# Patient Record
Sex: Male | Born: 1960 | Race: White | Hispanic: No | Smoking: Current every day smoker
Health system: Southern US, Community
[De-identification: ages and names within clinical notes are randomized; demographics above are authoritative.]

## PROBLEM LIST (undated history)

## (undated) DIAGNOSIS — A419 Sepsis, unspecified organism: Secondary | ICD-10-CM

## (undated) DIAGNOSIS — M1990 Unspecified osteoarthritis, unspecified site: Secondary | ICD-10-CM

## (undated) DIAGNOSIS — I10 Essential (primary) hypertension: Secondary | ICD-10-CM

## (undated) DIAGNOSIS — J449 Chronic obstructive pulmonary disease, unspecified: Secondary | ICD-10-CM

## (undated) DIAGNOSIS — T7840XA Allergy, unspecified, initial encounter: Secondary | ICD-10-CM

## (undated) DIAGNOSIS — M1611 Unilateral primary osteoarthritis, right hip: Secondary | ICD-10-CM

## (undated) DIAGNOSIS — E119 Type 2 diabetes mellitus without complications: Secondary | ICD-10-CM

## (undated) HISTORY — PX: ARM WOUND REPAIR / CLOSURE: SUR1141

## (undated) HISTORY — PX: SKIN GRAFT: SHX250

## (undated) HISTORY — PX: ANKLE SURGERY: SHX546

## (undated) HISTORY — DX: Severe sepsis without septic shock: A41.9

## (undated) HISTORY — DX: Allergy, unspecified, initial encounter: T78.40XA

## (undated) HISTORY — PX: NECK SURGERY: SHX720

---

## 2005-01-16 ENCOUNTER — Emergency Department: Payer: Self-pay | Admitting: Emergency Medicine

## 2005-01-17 ENCOUNTER — Emergency Department: Payer: Self-pay | Admitting: Emergency Medicine

## 2007-04-08 ENCOUNTER — Emergency Department: Payer: Self-pay | Admitting: Emergency Medicine

## 2012-11-26 ENCOUNTER — Emergency Department (HOSPITAL_COMMUNITY): Payer: Self-pay

## 2012-11-26 ENCOUNTER — Emergency Department (HOSPITAL_COMMUNITY)
Admission: EM | Admit: 2012-11-26 | Discharge: 2012-11-26 | Disposition: A | Discharge status: Home / Self Care | Payer: Self-pay | Attending: Emergency Medicine | Admitting: Emergency Medicine

## 2012-11-26 ENCOUNTER — Encounter (HOSPITAL_COMMUNITY): Payer: Self-pay

## 2012-11-26 DIAGNOSIS — M25569 Pain in unspecified knee: Secondary | ICD-10-CM | POA: Insufficient documentation

## 2012-11-26 DIAGNOSIS — Z9889 Other specified postprocedural states: Secondary | ICD-10-CM | POA: Insufficient documentation

## 2012-11-26 DIAGNOSIS — F17200 Nicotine dependence, unspecified, uncomplicated: Secondary | ICD-10-CM | POA: Insufficient documentation

## 2012-11-26 DIAGNOSIS — M25562 Pain in left knee: Secondary | ICD-10-CM

## 2012-11-26 DIAGNOSIS — Z87828 Personal history of other (healed) physical injury and trauma: Secondary | ICD-10-CM | POA: Insufficient documentation

## 2012-11-26 MED ORDER — HYDROCODONE-ACETAMINOPHEN 5-325 MG PO TABS: 1.0000 | ORAL_TABLET | ORAL | Status: DC | PRN

## 2012-11-26 MED ORDER — IBUPROFEN 600 MG PO TABS: 600.0000 mg | ORAL_TABLET | Freq: Four times a day (QID) | ORAL | Status: DC | PRN

## 2012-11-26 NOTE — ED Provider Notes (Signed)
Medical screening examination/treatment/procedure(s) were performed by non-physician practitioner and as supervising physician I was immediately available for consultation/collaboration.  Donnetta Hutching, MD 11/26/12 [phone]

## 2012-11-26 NOTE — ED Notes (Signed)
Pt c/o pain and swelling to left knee and leg x 1 week.  Reports football injury years ago but has never had any problems since.  Denies recent injury.

## 2012-11-26 NOTE — ED Provider Notes (Signed)
CSN: [id number]     Arrival date & time 11/26/12  0944 History   First MD Initiated Contact with Patient 11/26/12 [phone]     Chief Complaint  Patient presents with  . Knee Pain   (Consider location/radiation/quality/duration/timing/severity/associated sxs/prior Treatment) HPI Comments: Jimmy Bishop is a 52 y.o. Male presenting with left knee pain which has been worsened for the past week.  He denies any new injury specifically but does have a history of prior ankle fracture and proximal fibula fracture with surgical intervention.  He describes having screws in his patella which were removed about 5 years ago as they started backing out.  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 His pain is constant, now worse with attempts to flex the knee.  He denies fevers or chills, no recent injury.     The history is provided by the patient.    History reviewed. No pertinent past medical history. Past Surgical History  Procedure Laterality Date  . Ankle surgery     No family history on file. History  Substance Use Topics  . Smoking status: Current Every Day Smoker  . Smokeless tobacco: Not on file  . Alcohol Use: Yes     Comment: occ    Review of Systems  Constitutional: Negative for fever.  Musculoskeletal: Positive for arthralgias. Negative for myalgias and joint swelling.  Neurological: Negative for weakness and numbness.    Allergies  Review of patient's allergies indicates no known allergies.  Home Medications   Current Outpatient Rx  Name  Route  Sig  Dispense  Refill  . HYDROcodone-acetaminophen (NORCO/VICODIN) 5-325 MG per tablet   Oral   Take 1 tablet by mouth every 4 (four) hours as needed for pain.   15 tablet   0   . ibuprofen (ADVIL,MOTRIN) 600 MG tablet   Oral   Take 1 tablet (600 mg total) by mouth every 6 (six) hours as needed for pain.   30 tablet   0    BP 176/99  Pulse 87   Temp(Src) 98.6 F (37 C) (Oral)  Resp 20  Ht 5\' 9"  (1.753 m)  Wt 205 lb (92.987 kg)  BMI 30.26 kg/m2  SpO2 91% Physical Exam  Constitutional: He appears well-developed and well-nourished.  HENT:  Head: Atraumatic.  Neck: Normal range of motion.  Cardiovascular:  Pulses equal bilaterally  Musculoskeletal: He exhibits tenderness.       Left knee: He exhibits decreased range of motion, deformity and bony tenderness. He exhibits no effusion, no ecchymosis, no erythema, no LCL laxity, normal patellar mobility and no MCL laxity. Tenderness found. Patellar tendon tenderness noted.  He does have bony deformity to distal patella.  He can fully extend the left knee.  Flexion with increased pain beyond 90 degrees. Calf soft, nontender.  Neurological: He is alert. He has normal strength. He displays normal reflexes. No sensory deficit.  Equal strength  Skin: Skin is warm and dry.  Psychiatric: He has a normal mood and affect.    ED Course  Procedures (including critical care time) Labs Review Labs Reviewed - No data to display Imaging Review Dg Knee Complete 4 Views Left  11/26/2012   *RADIOLOGY REPORT*  Clinical Data: Knee pain and swelling.  No injury.  LEFT KNEE - COMPLETE 4+ VIEW  Comparison: None.  Findings: Evidence of bony remodelling compatible with an old proximal fibular fracture.  Mild chronic fragmentation over the region of the anterior proximal tibia/tibial tubercle.  No acute fracture or dislocation.  No significant joint effusion.  IMPRESSION: No acute findings.   Original Report Authenticated By: Elberta Fortis, M.D.    MDM   1. Knee pain, acute, left    Patients labs and/or radiological studies were viewed and considered during the medical decision making and disposition process. xrays reviewed with pt.  Placed on ibuprofen,  Hydrocodone prescribed.  Elevation, heat.  Referral to Dr. Hilda Lias for further management.      Burgess Amor, PA-C 11/26/12 1056

## 2012-11-26 NOTE — ED Notes (Signed)
Pt c/o pain and swelling in knee and swelling also noted in lower leg and foot.  Pedal pulse present.  Left lower leg more swollen than r.

## 2014-06-25 ENCOUNTER — Emergency Department
Admit: 2014-06-25 | Disposition: A | Discharge status: Home / Self Care | Payer: Self-pay | Admitting: Emergency Medicine

## 2014-06-25 LAB — COMPREHENSIVE METABOLIC PANEL
ALBUMIN: 4.1 g/dL
ALK PHOS: 81 U/L
AST: 47 U/L — AB
Anion Gap: 9 (ref 7–16)
BUN: 11 mg/dL
Bilirubin,Total: 1 mg/dL
CHLORIDE: 103 mmol/L
Calcium, Total: 9.2 mg/dL
Co2: 25 mmol/L
Creatinine: 0.77 mg/dL
EGFR (Non-African Amer.): >60
Glucose: 124 mg/dL — ABNORMAL HIGH
POTASSIUM: 4.1 mmol/L
SGPT (ALT): 48 U/L
SODIUM: 137 mmol/L
TOTAL PROTEIN: 8 g/dL

## 2014-06-25 LAB — CBC
HCT: 51.5 % (ref 40.0–52.0)
HGB: 17.2 g/dL (ref 13.0–18.0)
MCH: 31.4 pg (ref 26.0–34.0)
MCHC: 33.4 g/dL (ref 32.0–36.0)
MCV: 94 fL (ref 80–100)
Platelet: 203 10*3/uL (ref 150–440)
RBC: 5.48 10*6/uL (ref 4.40–5.90)
RDW: 13.8 % (ref 11.5–14.5)
WBC: 7 10*3/uL (ref 3.8–10.6)

## 2015-01-25 ENCOUNTER — Emergency Department (HOSPITAL_COMMUNITY): Payer: Medicaid Other

## 2015-01-25 ENCOUNTER — Encounter (HOSPITAL_COMMUNITY): Payer: Self-pay | Admitting: *Deleted

## 2015-01-25 ENCOUNTER — Emergency Department (HOSPITAL_COMMUNITY)
Admission: EM | Admit: 2015-01-25 | Discharge: 2015-01-25 | Disposition: A | Discharge status: Home / Self Care | Payer: Medicaid Other | Attending: Emergency Medicine | Admitting: Emergency Medicine

## 2015-01-25 DIAGNOSIS — Z792 Long term (current) use of antibiotics: Secondary | ICD-10-CM | POA: Diagnosis not present

## 2015-01-25 DIAGNOSIS — Z7982 Long term (current) use of aspirin: Secondary | ICD-10-CM | POA: Insufficient documentation

## 2015-01-25 DIAGNOSIS — Y9289 Other specified places as the place of occurrence of the external cause: Secondary | ICD-10-CM | POA: Insufficient documentation

## 2015-01-25 DIAGNOSIS — Y9389 Activity, other specified: Secondary | ICD-10-CM | POA: Diagnosis not present

## 2015-01-25 DIAGNOSIS — Y998 Other external cause status: Secondary | ICD-10-CM | POA: Insufficient documentation

## 2015-01-25 DIAGNOSIS — W268XXA Contact with other sharp object(s), not elsewhere classified, initial encounter: Secondary | ICD-10-CM | POA: Insufficient documentation

## 2015-01-25 DIAGNOSIS — S6992XA Unspecified injury of left wrist, hand and finger(s), initial encounter: Secondary | ICD-10-CM | POA: Diagnosis present

## 2015-01-25 DIAGNOSIS — Z720 Tobacco use: Secondary | ICD-10-CM | POA: Diagnosis not present

## 2015-01-25 DIAGNOSIS — S61412A Laceration without foreign body of left hand, initial encounter: Secondary | ICD-10-CM

## 2015-01-25 MED ORDER — LIDOCAINE HCL (PF) 1 % IJ SOLN
INTRAMUSCULAR | Status: AC
  Administered 2015-01-25: 5 mL
  Filled 2015-01-25: qty 5

## 2015-01-25 MED ORDER — CEPHALEXIN 500 MG PO CAPS: 500.0000 mg | ORAL_CAPSULE | Freq: Two times a day (BID) | ORAL | Status: DC

## 2015-01-25 NOTE — ED Notes (Signed)
Dr Zammit at bedside. 

## 2015-01-25 NOTE — ED Notes (Signed)
Laceration to left hand. Cut while skinning a deer. Last tetanus unknown

## 2015-01-25 NOTE — ED Provider Notes (Signed)
CSN: [id number]     Arrival date & time 01/25/15  1241 History   First MD Initiated Contact with Patient 01/25/15 1334     Chief Complaint  Patient presents with  . Extremity Laceration     (Consider location/radiation/quality/duration/timing/severity/associated sxs/prior Treatment) Patient is a 54 y.o. male presenting with hand injury. The history is provided by the patient (the pt cut his left hand when skinning a deer.  ).  Hand Injury Upper extremity pain location: left hand. Pain details:    Quality:  Aching   Radiates to:  Does not radiate   Severity:  Mild   Onset quality:  Sudden   Timing:  Constant Associated symptoms: no back pain and no fatigue     History reviewed. No pertinent past medical history. Past Surgical History  Procedure Laterality Date  . Ankle surgery     No family history on file. Social History  Substance Use Topics  . Smoking status: Current Every Day Smoker  . Smokeless tobacco: None  . Alcohol Use: Yes     Comment: occ    Review of Systems  Constitutional: Negative for appetite change and fatigue.  HENT: Negative for congestion, ear discharge and sinus pressure.   Eyes: Negative for discharge.  Respiratory: Negative for cough.   Cardiovascular: Negative for chest pain.  Gastrointestinal: Negative for abdominal pain and diarrhea.  Genitourinary: Negative for frequency and hematuria.  Musculoskeletal: Negative for back pain.       Hand pain  Skin: Negative for rash.  Neurological: Negative for seizures and headaches.  Psychiatric/Behavioral: Negative for hallucinations.      Allergies  Review of patient's allergies indicates no known allergies.  Home Medications   Prior to Admission medications   Medication Sig Start Date End Date Taking? Authorizing Provider  aspirin EC 81 MG tablet Take 81 mg by mouth daily.   Yes Historical Provider, MD  cephALEXin (KEFLEX) 500 MG capsule Take 1 capsule (500 mg total) by mouth 2 (two) times  daily. 01/25/15   Milton Ferguson, MD  HYDROcodone-acetaminophen (NORCO/VICODIN) 5-325 MG per tablet Take 1 tablet by mouth every 4 (four) hours as needed for pain. Patient not taking: Reported on 01/25/2015 11/26/12   Evalee Jefferson, PA-C  ibuprofen (ADVIL,MOTRIN) 600 MG tablet Take 1 tablet (600 mg total) by mouth every 6 (six) hours as needed for pain. Patient not taking: Reported on 01/25/2015 11/26/12   Evalee Jefferson, PA-C   BP 110/87 mmHg  Pulse 93  Temp(Src) 98 F (36.7 C) (Oral)  Resp 16  Ht 5\' 9"  (1.753 m)  Wt 190 lb (86.183 kg)  BMI 28.05 kg/m2  SpO2 97% Physical Exam  Constitutional: He is oriented to person, place, and time. He appears well-developed.  HENT:  Head: Normocephalic.  Eyes: Conjunctivae are normal.  Neck: No tracheal deviation present.  Cardiovascular:  No murmur heard. Musculoskeletal: Normal range of motion.  5 cm superficial laceration to the thenar eminence of left hand  Neurological: He is oriented to person, place, and time.  Skin: Skin is warm.  Psychiatric: He has a normal mood and affect.    ED Course  .Marland KitchenLaceration Repair Date/Time: 01/25/2015 2:54 PM Performed by: Milton Ferguson Authorized by: Milton Ferguson Comments: Patient had a 5 and regular laceration to his hand. It was cleaned thoroughly with Betadine. Lidocaine no epi was used to numb the area. 5 sutures of 4-0 nylon was used to close laceration. Patient tolerated the procedure well   (including critical care time) Labs  Review Labs Reviewed - No data to display  Imaging Review No results found. I have personally reviewed and evaluated these images and lab results as part of my medical decision-making.   EKG Interpretation None      MDM   Final diagnoses:  Hand laceration, left, initial encounter    5 cm laceration to left hand. Closed with 4-0 nylon. Patient sent home with Keflex and will follow-up with his family doctor that have sutures removed in 10-14 days    Milton Ferguson,  MD 01/25/15 1455

## 2015-01-25 NOTE — Discharge Instructions (Signed)
Sutures out in 10-14 days.  Clean area twice a day with soap and water

## 2015-02-26 ENCOUNTER — Other Ambulatory Visit: Payer: Self-pay | Admitting: Neurology

## 2015-02-26 DIAGNOSIS — R200 Anesthesia of skin: Secondary | ICD-10-CM

## 2015-02-27 ENCOUNTER — Ambulatory Visit
Admission: RE | Admit: 2015-02-27 | Discharge: 2015-02-27 | Disposition: A | Discharge status: Home / Self Care | Payer: Medicaid Other | Source: Ambulatory Visit | Attending: Neurology | Admitting: Neurology

## 2015-02-27 DIAGNOSIS — R200 Anesthesia of skin: Secondary | ICD-10-CM

## 2015-03-13 ENCOUNTER — Other Ambulatory Visit (HOSPITAL_COMMUNITY): Payer: Self-pay | Admitting: Neurological Surgery

## 2015-04-10 ENCOUNTER — Encounter (HOSPITAL_COMMUNITY): Payer: Self-pay

## 2015-04-10 ENCOUNTER — Ambulatory Visit (HOSPITAL_COMMUNITY)
Admission: RE | Admit: 2015-04-10 | Discharge: 2015-04-10 | Disposition: A | Discharge status: Home / Self Care | Payer: Medicaid Other | Source: Ambulatory Visit | Attending: Neurological Surgery | Admitting: Neurological Surgery

## 2015-04-10 ENCOUNTER — Encounter (HOSPITAL_COMMUNITY)
Admission: RE | Admit: 2015-04-10 | Discharge: 2015-04-10 | Disposition: A | Discharge status: Home / Self Care | Payer: Medicaid Other | Source: Ambulatory Visit | Attending: Neurological Surgery | Admitting: Neurological Surgery

## 2015-04-10 DIAGNOSIS — M4802 Spinal stenosis, cervical region: Secondary | ICD-10-CM | POA: Diagnosis not present

## 2015-04-10 DIAGNOSIS — Z01810 Encounter for preprocedural cardiovascular examination: Secondary | ICD-10-CM | POA: Insufficient documentation

## 2015-04-10 DIAGNOSIS — Z01818 Encounter for other preprocedural examination: Secondary | ICD-10-CM | POA: Diagnosis not present

## 2015-04-10 DIAGNOSIS — J449 Chronic obstructive pulmonary disease, unspecified: Secondary | ICD-10-CM | POA: Diagnosis not present

## 2015-04-10 DIAGNOSIS — R05 Cough: Secondary | ICD-10-CM | POA: Insufficient documentation

## 2015-04-10 DIAGNOSIS — F17200 Nicotine dependence, unspecified, uncomplicated: Secondary | ICD-10-CM | POA: Diagnosis not present

## 2015-04-10 DIAGNOSIS — Z01812 Encounter for preprocedural laboratory examination: Secondary | ICD-10-CM | POA: Diagnosis not present

## 2015-04-10 HISTORY — DX: Unspecified osteoarthritis, unspecified site: M19.90

## 2015-04-10 LAB — TYPE AND SCREEN
ABO/RH(D): O POS
Antibody Screen: NEGATIVE

## 2015-04-10 LAB — CBC WITH DIFFERENTIAL/PLATELET
BASOS ABS: 0 10*3/uL (ref 0.0–0.1)
BASOS PCT: 0 %
EOS ABS: 0 10*3/uL (ref 0.0–0.7)
EOS PCT: 1 %
HCT: 50.5 % (ref 39.0–52.0)
HEMOGLOBIN: 17.1 g/dL — AB (ref 13.0–17.0)
Lymphocytes Relative: 31 %
Lymphs Abs: 2.3 10*3/uL (ref 0.7–4.0)
MCH: 32.6 pg (ref 26.0–34.0)
MCHC: 33.9 g/dL (ref 30.0–36.0)
MCV: 96.4 fL (ref 78.0–100.0)
Monocytes Absolute: 0.8 10*3/uL (ref 0.1–1.0)
Monocytes Relative: 11 %
NEUTROS PCT: 57 %
Neutro Abs: 4.2 10*3/uL (ref 1.7–7.7)
PLATELETS: 283 10*3/uL (ref 150–400)
RBC: 5.24 MIL/uL (ref 4.22–5.81)
RDW: 13.1 % (ref 11.5–15.5)
WBC: 7.3 10*3/uL (ref 4.0–10.5)

## 2015-04-10 LAB — SURGICAL PCR SCREEN
MRSA, PCR: NEGATIVE
Staphylococcus aureus: NEGATIVE

## 2015-04-10 LAB — BASIC METABOLIC PANEL WITH GFR
Anion gap: 13 (ref 5–15)
BUN: 7 mg/dL (ref 6–20)
CO2: 23 mmol/L (ref 22–32)
Calcium: 9.5 mg/dL (ref 8.9–10.3)
Chloride: 102 mmol/L (ref 101–111)
Creatinine, Ser: 0.83 mg/dL (ref 0.61–1.24)
GFR calc Af Amer: >60 mL/min
GFR calc non Af Amer: >60 mL/min
Glucose, Bld: 111 mg/dL — ABNORMAL HIGH (ref 65–99)
Potassium: 3.4 mmol/L — ABNORMAL LOW (ref 3.5–5.1)
Sodium: 138 mmol/L (ref 135–145)

## 2015-04-10 LAB — PROTIME-INR
INR: 1.21 (ref 0.00–1.49)
PROTHROMBIN TIME: 15.4 s — AB (ref 11.6–15.2)

## 2015-04-10 LAB — ABO/RH: ABO/RH(D): O POS

## 2015-04-10 NOTE — Progress Notes (Addendum)
PCP:Dr. Delia Chimes in [facility]  Pt. Reported he felt he was getting a chest cold , has a dry cough, no fever. Stated he would contact his PCP.

## 2015-04-10 NOTE — Pre-Procedure Instructions (Signed)
    JHORDY FAZZINO  04/10/2015      [id number] [place], [place] - [address] [facility] [address] [facility] [address] [address] Phone: [phone] Fax: [phone]    Your procedure is scheduled on Friday, Jan 27  Report to [facility] Admitting at 5:30 A.M.  Call this number if you have problems the morning of surgery:  [phone]   Remember:  Do not eat food or drink liquids after midnight on Thursday, Jan. 26   Take these medicines the morning of surgery with A SIP OF WATER : none              1 Week prior to surgery (Jan. 20)  Stop aspirin,or NSAIDS: aleve, motrin, ibuprofen, BC'S, goody's   Do not wear jewelry, make-up or nail polish.  Do not wear lotions, powders, or perfumes.  You may not wear deodorant.  Do not shave 48 hours prior to surgery.  Men may shave face and neck.  Do not bring valuables to the hospital.  [facility] is not responsible for any belongings or valuables.  Contacts, dentures or bridgework may not be worn into surgery.  Leave your suitcase in the car.  After surgery it may be brought to your room.  For patients admitted to the hospital, discharge time will be determined by your treatment team.  Patients discharged the day of surgery will not be allowed to drive home.    Special instructions:  Review handouts  Please read over the following fact sheets that you were given. Pain Booklet, Coughing and Deep Breathing, Blood Transfusion Information, MRSA Information and Surgical Site Infection Prevention

## 2015-04-17 MED ORDER — DEXAMETHASONE SODIUM PHOSPHATE 10 MG/ML IJ SOLN
10.0000 mg | INTRAMUSCULAR | Status: AC
  Administered 2015-04-18: 10 mg via INTRAVENOUS
  Filled 2015-04-17: qty 1

## 2015-04-17 MED ORDER — CEFAZOLIN SODIUM-DEXTROSE 2-3 GM-% IV SOLR
2.0000 g | INTRAVENOUS | Status: AC
  Administered 2015-04-18: 2 g via INTRAVENOUS
  Filled 2015-04-17: qty 50

## 2015-04-18 ENCOUNTER — Inpatient Hospital Stay (HOSPITAL_COMMUNITY): Payer: Medicaid Other | Admitting: Emergency Medicine

## 2015-04-18 ENCOUNTER — Inpatient Hospital Stay (HOSPITAL_COMMUNITY)
Admission: RE | Admit: 2015-04-18 | Discharge: 2015-04-20 | DRG: 472 | Disposition: A | Discharge status: Home Health | Payer: Medicaid Other | Source: Ambulatory Visit | Attending: Neurological Surgery | Admitting: Neurological Surgery

## 2015-04-18 ENCOUNTER — Inpatient Hospital Stay (HOSPITAL_COMMUNITY): Payer: Medicaid Other

## 2015-04-18 ENCOUNTER — Encounter (HOSPITAL_COMMUNITY): Payer: Self-pay | Admitting: *Deleted

## 2015-04-18 ENCOUNTER — Inpatient Hospital Stay (HOSPITAL_COMMUNITY): Payer: Medicaid Other | Admitting: Anesthesiology

## 2015-04-18 ENCOUNTER — Encounter (HOSPITAL_COMMUNITY)
Admission: RE | Disposition: A | Discharge status: Home Health | Payer: Self-pay | Source: Ambulatory Visit | Attending: Neurological Surgery

## 2015-04-18 DIAGNOSIS — M4802 Spinal stenosis, cervical region: Principal | ICD-10-CM | POA: Diagnosis present

## 2015-04-18 DIAGNOSIS — M1712 Unilateral primary osteoarthritis, left knee: Secondary | ICD-10-CM | POA: Diagnosis present

## 2015-04-18 DIAGNOSIS — Z7982 Long term (current) use of aspirin: Secondary | ICD-10-CM

## 2015-04-18 DIAGNOSIS — F17210 Nicotine dependence, cigarettes, uncomplicated: Secondary | ICD-10-CM | POA: Diagnosis present

## 2015-04-18 DIAGNOSIS — M5003 Cervical disc disorder with myelopathy, cervicothoracic region: Secondary | ICD-10-CM | POA: Diagnosis present

## 2015-04-18 DIAGNOSIS — Z981 Arthrodesis status: Secondary | ICD-10-CM

## 2015-04-18 DIAGNOSIS — Z419 Encounter for procedure for purposes other than remedying health state, unspecified: Secondary | ICD-10-CM

## 2015-04-18 DIAGNOSIS — Z886 Allergy status to analgesic agent status: Secondary | ICD-10-CM

## 2015-04-18 DIAGNOSIS — M4712 Other spondylosis with myelopathy, cervical region: Secondary | ICD-10-CM | POA: Diagnosis present

## 2015-04-18 DIAGNOSIS — M4803 Spinal stenosis, cervicothoracic region: Secondary | ICD-10-CM | POA: Diagnosis present

## 2015-04-18 HISTORY — PX: POSTERIOR CERVICAL FUSION/FORAMINOTOMY: SHX5038

## 2015-04-18 SURGERY — POSTERIOR CERVICAL FUSION/FORAMINOTOMY LEVEL 5
Anesthesia: General | Site: Neck

## 2015-04-18 MED ORDER — METHOCARBAMOL 500 MG PO TABS
500.0000 mg | ORAL_TABLET | Freq: Four times a day (QID) | ORAL | Status: DC | PRN
  Administered 2015-04-18 – 2015-04-20 (×7): 500 mg via ORAL
  Filled 2015-04-18 (×8): qty 1

## 2015-04-18 MED ORDER — VANCOMYCIN HCL 1000 MG IV SOLR
INTRAVENOUS | Status: DC | PRN
  Administered 2015-04-18: 1000 mg via TOPICAL

## 2015-04-18 MED ORDER — OXYCODONE-ACETAMINOPHEN 5-325 MG PO TABS
1.0000 | ORAL_TABLET | ORAL | Status: DC | PRN
  Administered 2015-04-18 – 2015-04-20 (×9): 2 via ORAL
  Filled 2015-04-18 (×9): qty 2

## 2015-04-18 MED ORDER — FENTANYL CITRATE (PF) 250 MCG/5ML IJ SOLN
INTRAMUSCULAR | Status: AC
  Filled 2015-04-18: qty 5

## 2015-04-18 MED ORDER — ROCURONIUM BROMIDE 100 MG/10ML IV SOLN
INTRAVENOUS | Status: DC | PRN
  Administered 2015-04-18: 50 mg via INTRAVENOUS

## 2015-04-18 MED ORDER — DEXAMETHASONE 4 MG PO TABS
4.0000 mg | ORAL_TABLET | Freq: Four times a day (QID) | ORAL | Status: DC
  Administered 2015-04-18 – 2015-04-20 (×7): 4 mg via ORAL
  Filled 2015-04-18 (×7): qty 1

## 2015-04-18 MED ORDER — FENTANYL CITRATE (PF) 100 MCG/2ML IJ SOLN
INTRAMUSCULAR | Status: DC | PRN
  Administered 2015-04-18 (×3): 50 ug via INTRAVENOUS
  Administered 2015-04-18: 100 ug via INTRAVENOUS
  Administered 2015-04-18 (×2): 50 ug via INTRAVENOUS

## 2015-04-18 MED ORDER — SODIUM CHLORIDE 0.9 % IV SOLN
250.0000 mL | INTRAVENOUS | Status: DC
  Administered 2015-04-18: 250 mL via INTRAVENOUS

## 2015-04-18 MED ORDER — ROCURONIUM BROMIDE 50 MG/5ML IV SOLN
INTRAVENOUS | Status: AC
  Filled 2015-04-18: qty 1

## 2015-04-18 MED ORDER — PROPOFOL 10 MG/ML IV BOLUS
INTRAVENOUS | Status: AC
  Filled 2015-04-18: qty 20

## 2015-04-18 MED ORDER — LIDOCAINE HCL (CARDIAC) 20 MG/ML IV SOLN
INTRAVENOUS | Status: DC | PRN
  Administered 2015-04-18: 60 mg via INTRAVENOUS

## 2015-04-18 MED ORDER — PROPOFOL 10 MG/ML IV BOLUS
INTRAVENOUS | Status: DC | PRN
  Administered 2015-04-18: 20 mg via INTRAVENOUS
  Administered 2015-04-18: 200 mg via INTRAVENOUS

## 2015-04-18 MED ORDER — DEXTROSE 5 % IV SOLN
500.0000 mg | Freq: Four times a day (QID) | INTRAVENOUS | Status: DC | PRN
  Filled 2015-04-18: qty 5

## 2015-04-18 MED ORDER — LACTATED RINGERS IV SOLN
INTRAVENOUS | Status: DC | PRN
Start: 2015-04-18 — End: 2015-04-18
  Administered 2015-04-18 (×2): via INTRAVENOUS

## 2015-04-18 MED ORDER — PHENOL 1.4 % MT LIQD: 1.0000 | OROMUCOSAL | Status: DC | PRN

## 2015-04-18 MED ORDER — SODIUM CHLORIDE 0.9% FLUSH: 3.0000 mL | INTRAVENOUS | Status: DC | PRN

## 2015-04-18 MED ORDER — NEOSTIGMINE METHYLSULFATE 10 MG/10ML IV SOLN
INTRAVENOUS | Status: DC | PRN
  Administered 2015-04-18: 4 mg via INTRAVENOUS

## 2015-04-18 MED ORDER — GLYCOPYRROLATE 0.2 MG/ML IJ SOLN
INTRAMUSCULAR | Status: DC | PRN
  Administered 2015-04-18: .6 mg via INTRAVENOUS

## 2015-04-18 MED ORDER — LIDOCAINE HCL (CARDIAC) 20 MG/ML IV SOLN
INTRAVENOUS | Status: AC
  Filled 2015-04-18: qty 5

## 2015-04-18 MED ORDER — OXYCODONE HCL 5 MG/5ML PO SOLN: 5.0000 mg | Freq: Once | ORAL | Status: DC | PRN

## 2015-04-18 MED ORDER — OXYCODONE HCL 5 MG PO TABS: 5.0000 mg | ORAL_TABLET | Freq: Once | ORAL | Status: DC | PRN

## 2015-04-18 MED ORDER — PHENYLEPHRINE HCL 10 MG/ML IJ SOLN
INTRAMUSCULAR | Status: DC | PRN
  Administered 2015-04-18: 80 ug via INTRAVENOUS
  Administered 2015-04-18: 120 ug via INTRAVENOUS
  Administered 2015-04-18 (×2): 80 ug via INTRAVENOUS

## 2015-04-18 MED ORDER — VANCOMYCIN HCL 1000 MG IV SOLR
INTRAVENOUS | Status: AC
  Filled 2015-04-18: qty 1000

## 2015-04-18 MED ORDER — THROMBIN 5000 UNITS EX SOLR
OROMUCOSAL | Status: DC | PRN
  Administered 2015-04-18: 09:00:00 via TOPICAL

## 2015-04-18 MED ORDER — ACETAMINOPHEN 650 MG RE SUPP: 650.0000 mg | RECTAL | Status: DC | PRN

## 2015-04-18 MED ORDER — SODIUM CHLORIDE 0.9 % IR SOLN
Status: DC | PRN
  Administered 2015-04-18: 09:00:00

## 2015-04-18 MED ORDER — HYDROMORPHONE HCL 1 MG/ML IJ SOLN
INTRAMUSCULAR | Status: AC
  Administered 2015-04-18: 0.5 mg via INTRAVENOUS
  Filled 2015-04-18: qty 1

## 2015-04-18 MED ORDER — HYDROMORPHONE HCL 1 MG/ML IJ SOLN
0.2500 mg | INTRAMUSCULAR | Status: DC | PRN
  Administered 2015-04-18 (×2): 0.5 mg via INTRAVENOUS

## 2015-04-18 MED ORDER — ACETAMINOPHEN 325 MG PO TABS: 650.0000 mg | ORAL_TABLET | ORAL | Status: DC | PRN

## 2015-04-18 MED ORDER — ONDANSETRON HCL 4 MG/2ML IJ SOLN: 4.0000 mg | INTRAMUSCULAR | Status: DC | PRN

## 2015-04-18 MED ORDER — SUCCINYLCHOLINE CHLORIDE 20 MG/ML IJ SOLN
INTRAMUSCULAR | Status: DC | PRN
  Administered 2015-04-18: 100 mg via INTRAVENOUS

## 2015-04-18 MED ORDER — SODIUM CHLORIDE 0.9% FLUSH
3.0000 mL | Freq: Two times a day (BID) | INTRAVENOUS | Status: DC
  Administered 2015-04-18 – 2015-04-20 (×4): 3 mL via INTRAVENOUS

## 2015-04-18 MED ORDER — MIDAZOLAM HCL 2 MG/2ML IJ SOLN
INTRAMUSCULAR | Status: AC
  Filled 2015-04-18: qty 2

## 2015-04-18 MED ORDER — 0.9 % SODIUM CHLORIDE (POUR BTL) OPTIME
TOPICAL | Status: DC | PRN
  Administered 2015-04-18: 1000 mL

## 2015-04-18 MED ORDER — CEFAZOLIN SODIUM 1-5 GM-% IV SOLN
1.0000 g | Freq: Three times a day (TID) | INTRAVENOUS | Status: AC
  Administered 2015-04-18 – 2015-04-19 (×2): 1 g via INTRAVENOUS
  Filled 2015-04-18 (×2): qty 50

## 2015-04-18 MED ORDER — THROMBIN 20000 UNITS EX SOLR
CUTANEOUS | Status: DC | PRN
  Administered 2015-04-18: 09:00:00 via TOPICAL

## 2015-04-18 MED ORDER — MENTHOL 3 MG MT LOZG: 1.0000 | LOZENGE | OROMUCOSAL | Status: DC | PRN

## 2015-04-18 MED ORDER — PROMETHAZINE HCL 25 MG/ML IJ SOLN: 6.2500 mg | INTRAMUSCULAR | Status: DC | PRN

## 2015-04-18 MED ORDER — VECURONIUM BROMIDE 10 MG IV SOLR
INTRAVENOUS | Status: DC | PRN
  Administered 2015-04-18: 2 mg via INTRAVENOUS

## 2015-04-18 MED ORDER — DEXAMETHASONE SODIUM PHOSPHATE 4 MG/ML IJ SOLN
4.0000 mg | Freq: Four times a day (QID) | INTRAMUSCULAR | Status: DC
  Administered 2015-04-19: 4 mg via INTRAVENOUS
  Filled 2015-04-18: qty 1

## 2015-04-18 MED ORDER — PHENYLEPHRINE HCL 10 MG/ML IJ SOLN
10.0000 mg | INTRAMUSCULAR | Status: DC | PRN
  Administered 2015-04-18: 25 ug/min via INTRAVENOUS

## 2015-04-18 MED ORDER — ONDANSETRON HCL 4 MG/2ML IJ SOLN
INTRAMUSCULAR | Status: AC
  Filled 2015-04-18: qty 2

## 2015-04-18 MED ORDER — MIDAZOLAM HCL 5 MG/5ML IJ SOLN
INTRAMUSCULAR | Status: DC | PRN
  Administered 2015-04-18: 2 mg via INTRAVENOUS

## 2015-04-18 MED ORDER — MORPHINE SULFATE (PF) 2 MG/ML IV SOLN: 1.0000 mg | INTRAVENOUS | Status: DC | PRN

## 2015-04-18 MED ORDER — POTASSIUM CHLORIDE IN NACL 20-0.9 MEQ/L-% IV SOLN
INTRAVENOUS | Status: DC
Start: 2015-04-18 — End: 2015-04-20
  Administered 2015-04-18: 15:00:00 via INTRAVENOUS
  Filled 2015-04-18 (×5): qty 1000

## 2015-04-18 MED ORDER — OXYCODONE-ACETAMINOPHEN 5-325 MG PO TABS
ORAL_TABLET | ORAL | Status: AC
  Filled 2015-04-18: qty 2

## 2015-04-18 MED ORDER — BUPIVACAINE HCL (PF) 0.25 % IJ SOLN
INTRAMUSCULAR | Status: DC | PRN
  Administered 2015-04-18: 5 mL

## 2015-04-18 MED ORDER — ONDANSETRON HCL 4 MG/2ML IJ SOLN
INTRAMUSCULAR | Status: DC | PRN
  Administered 2015-04-18: 4 mg via INTRAVENOUS

## 2015-04-18 SURGICAL SUPPLY — 69 items
APL SKNCLS STERI-STRIP NONHPOA (GAUZE/BANDAGES/DRESSINGS) ×1
BAG DECANTER FOR FLEXI CONT (MISCELLANEOUS) ×2 IMPLANT
BENZOIN TINCTURE PRP APPL 2/3 (GAUZE/BANDAGES/DRESSINGS) ×2 IMPLANT
BIT DRILL VUEPOINT II (BIT) IMPLANT
BLADE CLIPPER SURG (BLADE) IMPLANT
BUR MATCHSTICK NEURO 3.0 LAGG (BURR) ×2 IMPLANT
CANISTER SUCT 3000ML PPV (MISCELLANEOUS) ×2 IMPLANT
DRAPE C-ARM 42X72 X-RAY (DRAPES) ×4 IMPLANT
DRAPE LAPAROTOMY 100X72 PEDS (DRAPES) ×2 IMPLANT
DRAPE POUCH INSTRU U-SHP 10X18 (DRAPES) ×2 IMPLANT
DRILL BIT VUEPOINT II (BIT) ×2
DRSG OPSITE 4X5.5 SM (GAUZE/BANDAGES/DRESSINGS) ×1 IMPLANT
DRSG OPSITE POSTOP 4X6 (GAUZE/BANDAGES/DRESSINGS) ×1 IMPLANT
DURAPREP 6ML APPLICATOR 50/CS (WOUND CARE) ×2 IMPLANT
ELECT REM PT RETURN 9FT ADLT (ELECTROSURGICAL) ×2
ELECTRODE REM PT RTRN 9FT ADLT (ELECTROSURGICAL) ×1 IMPLANT
EVACUATOR 1/8 PVC DRAIN (DRAIN) ×1 IMPLANT
GAUZE SPONGE 4X4 12PLY STRL (GAUZE/BANDAGES/DRESSINGS) ×2 IMPLANT
GAUZE SPONGE 4X4 16PLY XRAY LF (GAUZE/BANDAGES/DRESSINGS) IMPLANT
GLOVE BIO SURGEON STRL SZ8 (GLOVE) ×2 IMPLANT
GLOVE BIOGEL PI IND STRL 7.0 (GLOVE) IMPLANT
GLOVE BIOGEL PI IND STRL 7.5 (GLOVE) IMPLANT
GLOVE BIOGEL PI INDICATOR 7.0 (GLOVE) ×1
GLOVE BIOGEL PI INDICATOR 7.5 (GLOVE) ×3
GLOVE EXAM NITRILE LRG STRL (GLOVE) IMPLANT
GLOVE EXAM NITRILE MD LF STRL (GLOVE) IMPLANT
GLOVE EXAM NITRILE XL STR (GLOVE) IMPLANT
GLOVE EXAM NITRILE XS STR PU (GLOVE) IMPLANT
GLOVE SS BIOGEL STRL SZ 7 (GLOVE) IMPLANT
GLOVE SS N UNI LF 6.5 STRL (GLOVE) ×2 IMPLANT
GLOVE SS N UNI LF 7.0 STRL (GLOVE) ×2 IMPLANT
GLOVE SUPERSENSE BIOGEL SZ 7 (GLOVE) ×1
GOWN STRL REUS W/ TWL LRG LVL3 (GOWN DISPOSABLE) IMPLANT
GOWN STRL REUS W/ TWL XL LVL3 (GOWN DISPOSABLE) ×1 IMPLANT
GOWN STRL REUS W/TWL 2XL LVL3 (GOWN DISPOSABLE) IMPLANT
GOWN STRL REUS W/TWL LRG LVL3 (GOWN DISPOSABLE) ×2
GOWN STRL REUS W/TWL XL LVL3 (GOWN DISPOSABLE) ×6
HEMOSTAT POWDER KIT SURGIFOAM (HEMOSTASIS) ×1 IMPLANT
KIT BASIN OR (CUSTOM PROCEDURE TRAY) ×2 IMPLANT
KIT ROOM TURNOVER OR (KITS) ×2 IMPLANT
MARKER SKIN DUAL TIP RULER LAB (MISCELLANEOUS) ×2 IMPLANT
NDL HYPO 18GX1.5 BLUNT FILL (NEEDLE) IMPLANT
NDL HYPO 25X1 1.5 SAFETY (NEEDLE) ×1 IMPLANT
NDL SPNL 20GX3.5 QUINCKE YW (NEEDLE) ×1 IMPLANT
NEEDLE HYPO 18GX1.5 BLUNT FILL (NEEDLE) IMPLANT
NEEDLE HYPO 25X1 1.5 SAFETY (NEEDLE) ×2 IMPLANT
NEEDLE SPNL 20GX3.5 QUINCKE YW (NEEDLE) ×2 IMPLANT
NS IRRIG 1000ML POUR BTL (IV SOLUTION) ×2 IMPLANT
PACK LAMINECTOMY NEURO (CUSTOM PROCEDURE TRAY) ×2 IMPLANT
PIN MAYFIELD SKULL DISP (PIN) ×2 IMPLANT
ROD 120MM (Rod) ×2 IMPLANT
ROD SPNL 240X3.5XNS LF TI (Rod) IMPLANT
ROD VUEPOINT 80MM (Rod) ×1 IMPLANT
SCREW 4.0X20MM (Screw) ×2 IMPLANT
SCREW MA MM 3.5X12 (Screw) ×10 IMPLANT
SCREW SET THREADED (Screw) ×13 IMPLANT
SPONGE SURGIFOAM ABS GEL 100 (HEMOSTASIS) ×2 IMPLANT
STRIP CLOSURE SKIN 1/2X4 (GAUZE/BANDAGES/DRESSINGS) ×2 IMPLANT
SUT VIC AB 0 CT1 18XCR BRD8 (SUTURE) ×1 IMPLANT
SUT VIC AB 0 CT1 8-18 (SUTURE) ×2
SUT VIC AB 2-0 CP2 18 (SUTURE) ×2 IMPLANT
SUT VIC AB 3-0 SH 8-18 (SUTURE) ×4 IMPLANT
TOWEL OR 17X24 6PK STRL BLUE (TOWEL DISPOSABLE) ×2 IMPLANT
TOWEL OR 17X26 10 PK STRL BLUE (TOWEL DISPOSABLE) ×2 IMPLANT
TRAP SPECIMEN MUCOUS 40CC (MISCELLANEOUS) ×1 IMPLANT
TRAY FOLEY W/METER SILVER 14FR (SET/KITS/TRAYS/PACK) IMPLANT
TRAY FOLEY W/METER SILVER 16FR (SET/KITS/TRAYS/PACK) ×1 IMPLANT
UNDERPAD 30X30 INCONTINENT (UNDERPADS AND DIAPERS) ×1 IMPLANT
WATER STERILE IRR 1000ML POUR (IV SOLUTION) ×2 IMPLANT

## 2015-04-18 NOTE — Op Note (Signed)
04/18/2015  10:09 AM  PATIENT:  Jimmy Bishop  55 y.o. 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 Decompressive cervical laminectomy and medial facetectomy C3-C7 including foraminotomies at C7-T1, 2. Posterior cervical fusion C3-T1 utilizing locally harvested morcellized autologous bone graft, 3. Segmental fixation C3-T1 utilizing nuvasive lateral mass screws C3-C6 and pedicle screws at T1  SURGEON:  Sherley Bounds, MD  ASSISTANTS: none  ANESTHESIA:   General  EBL: 100 ml  Total I/O In: 1000 [I.V.:1000] Out: 210 [Urine:110; Blood:100]  BLOOD ADMINISTERED:none  DRAINS: med hemovac   SPECIMEN:  No Specimen  INDICATION FOR PROCEDURE: The patient presented with progressive numbness and weakness in his hands and hyperreflexia. MRI showed severe spinal stenosis C3-C7 T1. I recommended decompressive cervical laminectomy and instrumented fusion. Patient understood the risks, benefits, and alternatives and potential outcomes and wished to proceed.  PROCEDURE DETAILS: The patient was brought to the operating room. Generalized endotracheal anesthesia was induced. The patient was affixed a 3 point Mayfield headrest and rolled into the prone position on chest rolls. All pressure points were padded. The posterior cervical region was cleaned and prepped with DuraPrep and then draped in the usual sterile fashion. 7 cc of local anesthesia was injected and a dorsal midline incision made in the posterior cervical region and carried down to the cervical fascia. The fascia was opened and the paraspinous musculature was taken down to expose C3-T1. Intraoperative fluoroscopy confirmed my level and then the dissection was carried out over the lateral facets. I localized the midpoint of each lateral mass and marked a region 1 mm medial to the midpoint of the lateral mass, and then drilled in an upward and  outward direction into the safe zone of each lateral mass. I drilled to a depth of 12 mm and then checked my drill hole with a ball probe. I then placed a 12 mm lateral mass screws into the safe zone of each lateral mass of C3-C6 until they were 2 fingers tight. I then gently decompressed the central canal with the 1 and 2 mm Kerrison punch from C3-C7 with foraminotomies at C7-T1. Medial facetectomies were performed, and foraminotomies were performed at C7 T1 and C4-5 right. Once the decompression was complete the dura was full and capacious and I could see the spinal cord pulsatile through the dura. I palpated the pedicles of T1 and then drilled in a medial and downward trajectory with the hand drill. I palpated with a ball probe and then tapped in place 4-0 x 20 mm pedicle screws at T1. I then decorticated the lateral masses and the facet joints and packed them with local autograft and morcellized allograft to perform arthrodesis from C3-T1. I then placed rods into the multiaxial screw heads of the screws and locked these into position with the locking caps and anti-torque device. I then checked the final construct with AP/Lat fluoroscopy. I irrigated with saline solution containing bacitracin. I placed a medium Hemovac drain through separate stab incision, and lined the dura with Gelfoam. After hemostasis was achieved I closed the muscle and the fascia with 0 Vicryl, subcutaneous tissue with 2-0 Vicryl, and the subcuticular tissue with 3-0 Vicryl. The skin was closed with benzoin and Steri-Strips. A sterile dressing was applied, the patient was turned to the supine position and taken out of the headrest, awakened from general anesthesia and transferred to the recovery room in stable condition. At the end of the  procedure all sponge, needle and instrument counts were correct.   PLAN OF CARE: Admit to inpatient   PATIENT DISPOSITION:  PACU - hemodynamically stable.   Delay start of Pharmacological VTE agent  (>24hrs) due to surgical blood loss or risk of bleeding:  yes

## 2015-04-18 NOTE — Care Management Note (Signed)
Case Management Note  Patient Details  Name: Jimmy Bishop MRN: [id number] Date of Birth: 02-27-1961  Subjective/Objective:                    Action/Plan: Patient was admitted for a PCDF. Will follow for discharge needs pending PT/OT evals and physician orders.  Expected Discharge Date:                  Expected Discharge Plan:     In-House Referral:     Discharge planning Services     Post Acute Care Choice:    Choice offered to:     DME Arranged:    DME Agency:     HH Arranged:    HH Agency:     Status of Service:  In process, will continue to follow  Medicare Important Message Given:    Date Medicare IM Given:    Medicare IM give by:    Date Additional Medicare IM Given:    Additional Medicare Important Message give by:     If discussed at [place] of Stay Meetings, dates discussed:    Additional CommentsRolm Baptise, RN 04/18/2015, 4:19 PM [phone]

## 2015-04-18 NOTE — Anesthesia Procedure Notes (Signed)
Procedure Name: Intubation Date/Time: 04/18/2015 7:35 AM Performed by: Clearnce Sorrel Pre-anesthesia Checklist: Patient identified, Timeout performed, Emergency Drugs available, Suction available and Patient being monitored Patient Re-evaluated:Patient Re-evaluated prior to inductionOxygen Delivery Method: Circle system utilized Preoxygenation: Pre-oxygenation with 100% oxygen Intubation Type: IV induction Ventilation: Mask ventilation without difficulty, Two handed mask ventilation required and Oral airway inserted - appropriate to patient size Laryngoscope Size: Glidescope and 3 Grade View: Grade I Tube type: Oral Tube size: 7.5 mm Number of attempts: 1 Airway Equipment and Method: Stylet Placement Confirmation: ETT inserted through vocal cords under direct vision,  breath sounds checked- equal and bilateral and positive ETCO2 Secured at: 23 cm Tube secured with: Tape Dental Injury: Teeth and Oropharynx as per pre-operative assessment

## 2015-04-18 NOTE — Anesthesia Preprocedure Evaluation (Addendum)
Anesthesia Evaluation  Patient identified by MRN, date of birth, ID band Patient awake    Reviewed: Allergy & Precautions, NPO status , Patient's Chart, lab work & pertinent test results  Airway Mallampati: III  TM Distance: >3 FB Neck ROM: Limited    Dental  (+) Dental Advisory Given, Missing   Pulmonary Current Smoker,    breath sounds clear to auscultation       Cardiovascular negative cardio ROS   Rhythm:Regular Rate:Normal     Neuro/Psych negative neurological ROS     GI/Hepatic negative GI ROS, Neg liver ROS,   Endo/Other  negative endocrine ROS  Renal/GU negative Renal ROS     Musculoskeletal  (+) Arthritis ,   Abdominal   Peds  Hematology negative hematology ROS (+)   Anesthesia Other Findings   Reproductive/Obstetrics                            Lab Results  Component Value Date   WBC 7.3 04/10/2015   HGB 17.1* 04/10/2015   HCT 50.5 04/10/2015   MCV 96.4 04/10/2015   PLT 283 04/10/2015   Lab Results  Component Value Date   CREATININE 0.83 04/10/2015   BUN 7 04/10/2015   NA 138 04/10/2015   K 3.4* 04/10/2015   CL 102 04/10/2015   CO2 23 04/10/2015    Anesthesia Physical Anesthesia Plan  ASA: II  Anesthesia Plan: General   Post-op Pain Management:    Induction: Intravenous  Airway Management Planned: Oral ETT  Additional Equipment:   Intra-op Plan:   Post-operative Plan: Extubation in OR  Informed Consent: I have reviewed the patients History and Physical, chart, labs and discussed the procedure including the risks, benefits and alternatives for the proposed anesthesia with the patient or authorized representative who has indicated his/her understanding and acceptance.     Plan Discussed with: CRNA  Anesthesia Plan Comments:         Anesthesia Quick Evaluation

## 2015-04-18 NOTE — Anesthesia Postprocedure Evaluation (Signed)
Anesthesia Post Note  Patient: Jimmy Bishop  Procedure(s) Performed: Procedure(s) (LRB): Posterior cervical fusion with lateral mass fixation - Cervical three-Thoracic one, cervical laminectomy Cervical three-Cervical seven (N/A)  Patient location during evaluation: PACU Anesthesia Type: General Level of consciousness: awake and alert Pain management: pain level controlled Vital Signs Assessment: post-procedure vital signs reviewed and stable Respiratory status: spontaneous breathing Cardiovascular status: blood pressure returned to baseline Anesthetic complications: no    Last Vitals:  Filed Vitals:   04/18/15 1445 04/18/15 1537  BP: 149/87 166/94  Pulse: 58 70  Temp:  36.4 C  Resp: 15 16    Last Pain:  Filed Vitals:   04/18/15 1541  PainSc: 10-Worst pain ever                 Tiajuana Amass

## 2015-04-18 NOTE — Transfer of Care (Signed)
Immediate Anesthesia Transfer of Care Note  Patient: Jimmy Bishop  Procedure(s) Performed: Procedure(s): Posterior cervical fusion with lateral mass fixation - Cervical three-Thoracic one, cervical laminectomy Cervical three-Cervical seven (N/A)  Patient Location: PACU  Anesthesia Type:General  Level of Consciousness: awake, alert  and oriented  Airway & Oxygen Therapy: Patient Spontanous Breathing and Patient connected to nasal cannula oxygen  Post-op Assessment: Report given to RN and Post -op Vital signs reviewed and stable  Post vital signs: Reviewed and stable  Last Vitals:  Filed Vitals:   04/18/15 0603 04/18/15 0613  BP: 152/88   Pulse: 77   Temp:  36.5 C  Resp: 18     Complications: No apparent anesthesia complications

## 2015-04-18 NOTE — Progress Notes (Signed)
Pt is admitted to 5C19 from PACU. Admission VS is stable.

## 2015-04-18 NOTE — H&P (Signed)
Subjective:   Patient is a 55 y.o. male admitted for cervical stenosis with myelopathy. The patient first presented to me with complaints of neck pain, numbness of the arm(s) and change in gait. Onset of symptoms was several months ago. The pain is described as aching and occurs intermittently. The pain is rated moderate, and is located  In the neck and radiates to the arms. The symptoms have been progressive. Symptoms are exacerbated by extending head backwards, and are relieved by none.  Previous work up includes MRI of cervical spine, results: spinal stenosis.  Past Medical History  Diagnosis Date  . Arthritis     left knee    Past Surgical History  Procedure Laterality Date  . Ankle surgery      Allergies  Allergen Reactions  . Vicodin [Hydrocodone-Acetaminophen] Hives and Rash    Social History  Substance Use Topics  . Smoking status: Current Every Day Smoker -- 1.00 packs/day for 40 years    Types: Cigarettes  . Smokeless tobacco: Not on file  . Alcohol Use: Yes     Comment: occ    History reviewed. No pertinent family history. Prior to Admission medications   Medication Sig Start Date End Date Taking? Authorizing Provider  aspirin EC 81 MG tablet Take 81 mg by mouth daily.   Yes Historical Provider, MD     Review of Systems  Positive ROS: neg  All other systems have been reviewed and were otherwise negative with the exception of those mentioned in the HPI and as above.  Objective: Vital signs in last 24 hours: Temp:  [97.7 F (36.5 C)] 97.7 F (36.5 C) (01/27 [id number]) Pulse Rate:  [77] 77 (01/27 0603) Resp:  [18] 18 (01/27 0603) BP: (152)/(88) 152/88 mmHg (01/27 0603) SpO2:  [95 %] 95 % (01/27 0603)  General Appearance: Alert, cooperative, no distress, appears stated age Head: Normocephalic, without obvious abnormality, atraumatic Eyes: PERRL, conjunctiva/corneas clear, EOM's intact      Neck: Supple, symmetrical, trachea midline, Back: Symmetric, no  curvature, ROM normal, no CVA tenderness Lungs:  respirations unlabored Heart: Regular rate and rhythm Abdomen: Soft, non-tender Extremities: Extremities normal, atraumatic, no cyanosis or edema Pulses: 2+ and symmetric all extremities Skin: Skin color, texture, turgor normal, no rashes or lesions  NEUROLOGIC:  Mental status: Alert and oriented x4, no aphasia, good attention span, fund of knowledge and memory  Motor Exam - grossly normal except decreased grips Sensory Exam - grossly normal Reflexes: 3+ Coordination - grossly normal Gait - spastic Balance - grossly normal Cranial Nerves: I: smell Not tested  II: visual acuity  OS: nl    OD: nl  II: visual fields Full to confrontation  II: pupils Equal, round, reactive to light  III,VII: ptosis None  III,IV,VI: extraocular muscles  Full ROM  V: mastication Normal  V: facial light touch sensation  Normal  V,VII: corneal reflex  Present  VII: facial muscle function - upper  Normal  VII: facial muscle function - lower Normal  VIII: hearing Not tested  IX: soft palate elevation  Normal  IX,X: gag reflex Present  XI: trapezius strength  5/5  XI: sternocleidomastoid strength 5/5  XI: neck flexion strength  5/5  XII: tongue strength  Normal    Data Review Lab Results  Component Value Date   WBC 7.3 04/10/2015   HGB 17.1* 04/10/2015   HCT 50.5 04/10/2015   MCV 96.4 04/10/2015   PLT 283 04/10/2015   Lab Results  Component Value Date  NA 138 04/10/2015   K 3.4* 04/10/2015   CL 102 04/10/2015   CO2 23 04/10/2015   BUN 7 04/10/2015   CREATININE 0.83 04/10/2015   GLUCOSE 111* 04/10/2015   Lab Results  Component Value Date   INR 1.21 04/10/2015    Assessment:   Cervical neck pain with herniated nucleus pulposus/ spondylosis/ stenosis at C3-T1. Patient has failed conservative therapy. Planned surgery : CL C3-C7, PCF with instrumentation C3-T1  Plan:   I explained the condition and procedure to the patient and  answered any questions.  Patient wishes to proceed with procedure as planned. Understands risks/ benefits/ and expected or typical outcomes.  Geanine Vandekamp S 04/18/2015 6:25 AM

## 2015-04-19 NOTE — Progress Notes (Signed)
Patient ID: Jimmy Bishop, male   DOB: 01/02/1961, 55 y.o.   MRN: [id number] BP 135/87 mmHg  Pulse 84  Temp(Src) 98 F (36.7 C) (Oral)  Resp 18  SpO2 99% Alert and oriented x 4, speech is clear and fluent Moving all extremities well Wound is clean, dry, and without signs of infection Needs therapy today

## 2015-04-19 NOTE — Evaluation (Signed)
Occupational Therapy Evaluation Patient Details Name: Jimmy Bishop MRN: [id number] DOB: 1960/06/10 Today's Date: 04/19/2015    History of Present Illness Pt is s/p decompressive cervical laminectomy and medial facetectomy C3-C7 including foraminotomies at C7-T1, 2. Posterior cervical fusion C3-T1    Clinical Impression   Pt overall at min guard assist level with cues for cervical precautions. Issued handout with precautions and ADL information and reviewed with pt. He will be staying with elderly parents at d/c. Would benefit from skilled OT for grip strengthening exercises and education and progress safety and independence with ADL. Will follow.     Follow Up Recommendations  No OT follow up;Supervision - Intermittent    Equipment Recommendations  None recommended by OT    Recommendations for Other Services       Precautions / Restrictions Precautions Precautions: Cervical Precaution Comments: Issued precaution handout and reviewed information and precautions with pt.      Mobility Bed Mobility Overal bed mobility: Needs Assistance Bed Mobility: Rolling;Sidelying to Sit Rolling: Supervision Sidelying to sit: Min guard       General bed mobility comments: cues for technique.   Transfers Overall transfer level: Needs assistance Equipment used: None Transfers: Sit to/from Stand Sit to Stand: Min guard         General transfer comment: cues for technique.     Balance                                            ADL Overall ADL's : Needs assistance/impaired Eating/Feeding: Independent;Sitting   Grooming: Wash/dry hands;Set up;Sitting   Upper Body Bathing: Set up;Supervision/ safety;Sitting   Lower Body Bathing: Min guard;Sit to/from stand   Upper Body Dressing : Set up;Supervision/safety;Sitting   Lower Body Dressing: Min guard   Toilet Transfer: Min guard;Ambulation;Comfort height toilet;Grab bars   Toileting- Clothing  Manipulation and Hygiene: Min guard;Sit to/from stand         General ADL Comments: Issued cervical precaution handout and reviewed information. Discussed sitting down for showering on built in seat and crossing LEs up to wash. Discussed using sense of feel to don socks and not flexing neck to look down. Pt needed cues for bed mobility and to limit use of UEs. He was able to transfer to standing without pushing on EOB or grab bar but did hold to bar for balance support for sitting down on commode. Pt states his grip strength feels alittle weak but improved . Would benefit from theraputty exercises for next visit. Pt states he will be staying with parents at d/c for awhile.      Vision     Perception     Praxis      Pertinent Vitals/Pain Pain Assessment: 0-10 Pain Score: 9  Pain Location: neck Pain Descriptors / Indicators: Aching Pain Intervention(s): Monitored during session;Repositioned;Other (comment) (nursing gave pain meds just prior to session)     Hand Dominance Right   Extremity/Trunk Assessment Upper Extremity Assessment Upper Extremity Assessment: RUE deficits/detail;LUE deficits/detail RUE Deficits / Details: some decreased grip strength but pt states much improved since admission RUE Sensation: decreased light touch (per pt in fingertips only) LUE Deficits / Details: as above.           Communication Communication Communication: No difficulties   Cognition Arousal/Alertness: Awake/alert Behavior During Therapy: WFL for tasks assessed/performed Overall Cognitive Status: Within Functional Limits for tasks assessed  General Comments       Exercises       Shoulder Instructions      Home Living Family/patient expects to be discharged to:: Private residence Living Arrangements: Alone (will be going to stay with parents.) Available Help at Discharge: Family Type of Home: House Home Access: Stairs to enter [employer]  of Steps: 3   Home Layout: One level     Bathroom Shower/Tub: Occupational psychologist: Standard     Home Equipment: Grab bars - toilet          Prior Functioning/Environment Level of Independence: Independent             OT Diagnosis: Generalized weakness   OT Problem List: Decreased strength;Decreased knowledge of use of DME or AE;Decreased knowledge of precautions   OT Treatment/Interventions: Self-care/ADL training;Patient/family education;Therapeutic activities;DME and/or AE instruction;Therapeutic exercise    OT Goals(Current goals can be found in the care plan section) Acute Rehab OT Goals Patient Stated Goal: to return to independence.  OT Goal Formulation: With patient Time For Goal Achievement: 04/26/15 Potential to Achieve Goals: Good  OT Frequency: Min 2X/week   Barriers to D/C:            Co-evaluation              End of Session    Activity Tolerance: Patient tolerated treatment well Patient left: in chair;with call bell/phone within reach   Time: 1405-1430 OT Time Calculation (min): 25 min Charges:  OT General Charges $OT Visit: 1 Procedure OT Evaluation $OT Eval Low Complexity: 1 Procedure OT Treatments $Therapeutic Activity: 8-22 mins G-Codes:    Jules Schick  [id number] 04/19/2015, 2:57 PM

## 2015-04-19 NOTE — Evaluation (Signed)
Physical Therapy Evaluation Patient Details Name: Jimmy Bishop MRN: [id number] DOB: 02/27/1961 Today's Date: 04/19/2015   History of Present Illness  Pt is s/p decompressive cervical laminectomy and medial facetectomy C3-C7 including foraminotomies at C7-T1, 2. Posterior cervical fusion C3-T1   Clinical Impression  Patient is functioning at Mod I level with mobility and gait.  All education completed. No acute PT needs identified - PT will sign off.    Follow Up Recommendations No PT follow up;Supervision - Intermittent    Equipment Recommendations  None recommended by PT    Recommendations for Other Services       Precautions / Restrictions Precautions Precautions: Cervical Precaution Comments: Reviewed precautions Restrictions Weight Bearing Restrictions: No      Mobility  Bed Mobility               General bed mobility comments: OOB  Transfers Overall transfer level: Modified independent Equipment used: None Transfers: Sit to/from Stand Sit to Stand: Modified independent (Device/Increase time)         General transfer comment: Patient using correct technique.  Increased time required.  Ambulation/Gait Ambulation/Gait assistance: Modified independent (Device/Increase time) Ambulation Distance (Feet): 200 Feet Assistive device: None Gait Pattern/deviations: Step-through pattern;Decreased stride length Gait velocity: decreased Gait velocity interpretation: Below normal speed for age/gender General Gait Details: Good gait pattern and balance.  Maintains cervical precautions.  Stairs            Wheelchair Mobility    Modified Rankin (Stroke Patients Only)       Balance Overall balance assessment: Modified Independent                                           Pertinent Vitals/Pain Pain Assessment: 0-10 Pain Score: 5  Pain Location: neck Pain Descriptors / Indicators: Aching;Sore Pain Intervention(s): Monitored  during session    Home Living Family/patient expects to be discharged to:: Private residence Living Arrangements: Alone (Will be going to parents home at d/c) Available Help at Discharge: Family;Available 24 hours/day Type of Home: House Home Access: Stairs to enter Entrance Stairs-Rails: Right Entrance Stairs-Number of Steps: 3 Home Layout: One level Home Equipment: Grab bars - toilet      Prior Function Level of Independence: Independent               Hand Dominance   Dominant Hand: Right    Extremity/Trunk Assessment   Upper Extremity Assessment: Defer to OT evaluation           Lower Extremity Assessment: Overall WFL for tasks assessed         Communication   Communication: No difficulties  Cognition Arousal/Alertness: Awake/alert Behavior During Therapy: WFL for tasks assessed/performed Overall Cognitive Status: Within Functional Limits for tasks assessed                      General Comments      Exercises        Assessment/Plan    PT Assessment Patent does not need any further PT services  PT Diagnosis Abnormality of gait;Acute pain   PT Problem List    PT Treatment Interventions     PT Goals (Current goals can be found in the Care Plan section) Acute Rehab PT Goals PT Goal Formulation: All assessment and education complete, DC therapy    Frequency     Barriers to discharge  Co-evaluation               End of Session   Activity Tolerance: Patient tolerated treatment well Patient left: in chair;with call bell/phone within reach Nurse Communication: Mobility status (No f/u PT needs)         Time: 1630-1640 PT Time Calculation (min) (ACUTE ONLY): 10 min   Charges:   PT Evaluation $PT Eval Low Complexity: 1 Procedure     PT G CodesDespina Pole 05/18/15, 7:28 PM Carita Pian. Sanjuana Kava, [place] Pager [phone]

## 2015-04-20 MED ORDER — METHOCARBAMOL 750 MG PO TABS
750.0000 mg | ORAL_TABLET | Freq: Four times a day (QID) | ORAL | Status: DC
Start: 2015-04-20 — End: 2017-05-27

## 2015-04-20 MED ORDER — POLYETHYLENE GLYCOL 3350 17 G PO PACK
17.0000 g | PACK | Freq: Every day | ORAL | Status: DC
  Administered 2015-04-20: 17 g via ORAL
  Filled 2015-04-20: qty 1

## 2015-04-20 MED ORDER — OXYCODONE-ACETAMINOPHEN 5-325 MG PO TABS: 1.0000 | ORAL_TABLET | ORAL | Status: DC | PRN

## 2015-04-20 NOTE — Progress Notes (Signed)
No acute events AVSS Moving all extremities well Incision looks good Discharge home

## 2015-04-20 NOTE — Care Management (Signed)
CM spoke with patient regarding the recommendations for rolling walker, [facility] contacted  Jimmy Bishop DME Liaison at [facility]. He confirmed he will deliver rolling walker to room prior discharge. No further CM needs identified.

## 2015-04-20 NOTE — Care Management Note (Signed)
Case Management Note  Patient Details  Name: Jimmy Bishop MRN: [id number] Date of Birth: [birth year]  Subjective/Objective:                  Patient was admitted for a PCDF Action/Plan: Discharge planning Expected Discharge Date:  04/20/15               Expected Discharge Plan:  Home/Self Care  In-House Referral:     Discharge planning Services  CM Consult  Post Acute Care Choice:    Choice offered to:     DME Arranged:  Walker rolling DME Agency:  [place]:  NA [place] Agency:     Status of Service:  Completed, signed off  Medicare Important Message Given:    Date Medicare IM Given:    Medicare IM give by:    Date Additional Medicare IM Given:    Additional Medicare Important Message give by:     If discussed at [place] of Stay Meetings, dates discussed:    Additional Comments: CM revceived call from RN requesting rolling walker for pt.  CM called [facility] DME rep, Merry Proud to please deliver the rolling walker to room so pt can discharge. No HH services recc or ordered.  No other CM needs were communicated. Dellie Catholic, RN 04/20/2015, 2:36 PM

## 2015-04-20 NOTE — Progress Notes (Signed)
Occupational Therapy Treatment Patient Details Name: MERICK KELLEHER MRN: [id number] DOB: April 29, 1960 Today's Date: 04/20/2015    History of present illness Pt is s/p decompressive cervical laminectomy and medial facetectomy C3-C7 including foraminotomies at C7-T1, 2. Posterior cervical fusion C3-T1    OT comments  Pt is doing very well.  He demonstrates independence with cervical precautions and is able to perform ADLs mod I.  He was provided with yellow theraputty and HEP to improve strength Rt hand - he demonstrates independence.  All OT goals met, no further needs identified, OT will sign of, pt agrees.   Follow Up Recommendations  No OT follow up;Supervision - Intermittent    Equipment Recommendations  None recommended by OT    Recommendations for Other Services      Precautions / Restrictions Precautions Precautions: Cervical Precaution Comments: Pt demonstrates independence with cervical precautions        Mobility Bed Mobility Overal bed mobility: Modified Independent                Transfers Overall transfer level: Modified independent                    Balance Overall balance assessment: Modified Independent                                 ADL                                         General ADL Comments: Pt demonstrates ability to perform ADLs mod I.   Discussed use and acquisition of reacher with pt.   He states he feels confident with discharging home with family       Vision                     Perception     Praxis      Cognition   Behavior During Therapy: WFL for tasks assessed/performed Overall Cognitive Status: Within Functional Limits for tasks assessed                       Extremity/Trunk Assessment               Exercises Other Exercises Other Exercises: Pt reports he feels like grip is improving daily.  strength 4+/5 bil.  He reports tingling Rt hand which is  improving.   Pt provided with yellow theraputty and was able to perform HEP independently    Shoulder Instructions       General Comments      Pertinent Vitals/ Pain       Pain Assessment: 0-10 Pain Score: 2  Pain Location: neck  Pain Descriptors / Indicators: Aching Pain Intervention(s): Monitored during session  Home Living                                          Prior Functioning/Environment              Frequency       Progress Toward Goals  OT Goals(current goals can now be found in the care plan section)  Progress towards OT goals: Goals met/education completed, patient discharged from [place]  All goals met and education completed, patient discharged from OT services    Co-evaluation                 End of Session     Activity Tolerance Patient tolerated treatment well   Patient Left in chair;with call bell/phone within reach   Nurse Communication Mobility status        Time: 6599-3570 OT Time Calculation (min): 20 min  Charges: OT General Charges $OT Visit: 1 Procedure OT Treatments $Therapeutic Activity: 8-22 mins  Kamrin Sibley M 04/20/2015, 12:20 PM

## 2015-04-20 NOTE — Discharge Summary (Signed)
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 Decompressive cervical laminectomy and medial facetectomy C3-C7 including foraminotomies at C7-T1, 2. Posterior cervical fusion C3-T1 utilizing locally harvested morcellized autologous bone graft, 3. Segmental fixation C3-T1 utilizing nuvasive lateral mass screws C3-C6 and pedicle screws at T1  Attending: Sherley Bounds, MD  Hospital Course:  The patient was admitted to the hospital morning surgery for the above listed operation. He tolerated it well. He had an uneventful postoperative course and was discharged home on postoperative day 2.  Discharged Medications: Resume prior medications  Follow up: Dr. Ronnald Ramp in 3 weeks

## 2015-04-21 ENCOUNTER — Encounter (HOSPITAL_COMMUNITY): Payer: Self-pay | Admitting: Neurological Surgery

## 2015-06-11 ENCOUNTER — Ambulatory Visit: Payer: Medicaid Other | Admitting: Physical Therapy

## 2015-06-23 ENCOUNTER — Ambulatory Visit: Payer: Medicaid Other | Attending: Neurological Surgery | Admitting: Physical Therapy

## 2015-06-23 DIAGNOSIS — Z981 Arthrodesis status: Secondary | ICD-10-CM | POA: Diagnosis present

## 2015-06-23 DIAGNOSIS — M542 Cervicalgia: Secondary | ICD-10-CM | POA: Diagnosis present

## 2015-06-23 NOTE — Patient Instructions (Signed)
Patient educated to maintain more upright posture and perform soft tissue mobilization on trigger points PRN.

## 2015-06-23 NOTE — Therapy (Signed)
[facility] [address] [place], [address] Phone: [phone]   Fax:  [phone]  Physical Therapy Evaluation  Patient Details  Name: Jimmy Bishop MRN: [id number] Date of Birth: October 24, 1960 No Data Recorded  Encounter Date: 06/23/2015      PT End of Session - 06/23/15 1050    Visit Number 1   Number of Visits 3   Date for PT Re-Evaluation 07/14/15   Authorization Type Medicaid    PT Start Time [id number]   PT Stop Time 0945   PT Time Calculation (min) 50 min   Activity Tolerance Patient tolerated treatment well   Behavior During Therapy [facility] for tasks assessed/performed      Past Medical History  Diagnosis Date  . Arthritis     left knee    Past Surgical History  Procedure Laterality Date  . Ankle surgery    . Posterior cervical fusion/foraminotomy N/A 04/18/2015    Procedure: Posterior cervical fusion with lateral mass fixation - Cervical three-Thoracic one, cervical laminectomy Cervical three-Cervical seven;  Surgeon: Eustace Moore, MD;  Location: [facility];  Service: Neurosurgery;  Laterality: N/A;    There were no vitals filed for this visit.  Visit Diagnosis:  Cervicalgia - Plan: PT plan of care cert/re-cert  S/P cervical spinal fusion - Plan: PT plan of care cert/re-cert      Subjective Assessment - 06/23/15 1308    Subjective Patient reports he fell from a 2 story roof onto concrete, he began developing neurologic signs (numbness, weakness, tingling) in his UEs and elected to have fusion surgery. He reports decreased symptoms from that time, though continues to have minor degrees of tingling in his hands.    Pertinent History Patient had C3-T1 posterior fusion with laminectomy C3-C7.   Limitations Sitting;Lifting   Patient Stated Goals Decreased pain in his neck    Currently in Pain? Yes   Pain Score 6    Pain Location Neck   Pain Orientation Left;Posterior;Lower   Pain  Descriptors / Indicators Aching;Tender   Pain Type Chronic pain;Surgical pain   Pain Onset More than a month ago   Pain Frequency Constant   Aggravating Factors  End range rotations, flexion, extension             [facility] PT Assessment - 06/23/15 1056    Assessment   Medical Diagnosis --  Spinal stenosis, cervical region   Referring Provider --  Lindajo Royal   Onset Date/Surgical Date 04/20/15   Hand Dominance Right   Precautions   Precautions Cervical   Restrictions   Weight Bearing Restrictions No   Balance Screen   Has the patient fallen in the past 6 months No   Prior Function   Level of Independence Independent   Leisure --  Mowing yard, fishing, hunting   Cognition   Overall Cognitive Status Within Functional Limits for tasks assessed   Observation/Other Assessments   Neck Disability Index  52   Sensation   Light Touch Appears Intact   Posture/Postural Control   Posture/Postural Control Postural limitations   Postural Limitations Rounded Shoulders;Forward head;Increased thoracic kyphosis   AROM   Cervical Flexion 20   Cervical Extension 31   Cervical - Right Rotation 31   Cervical - Left Rotation 37   Strength   Overall Strength Comments --  No deficits identified in shoulders/elbows/grip bilaterally   Palpation   Palpation comment --  Trigger points noted throughout UT/SCM/Scalene musculature  Identified multiple trigger points in UT, SCM, Scalenes provided ischemic trigger point tx as well as soft tissue mobilization over tender areas with relief of symptoms noted afterwards.   Addressed posture with patient, initially patient noted to be in excessive cervico-thoracic kyphosis with upper C spine in extension, patient reported significant relief of symptoms after cuing for postural change.                       PT Education - July 14, 2015 1049    Education provided Yes   Education Details Patient educated on soft tissue mobilization on  trigger points, upright posture and it's impact on ROM.    Person(s) Educated Patient   Methods Explanation;Demonstration   Comprehension Verbalized understanding;Returned demonstration             PT Long Term Goals - July 14, 2015 1312    PT LONG TERM GOAL #1   Title Patient will report an NDI score of less than 30% disability to demonstrate improved tolerance for ADLs.    Baseline 52%   Time 3   Period Weeks   Status New   PT LONG TERM GOAL #2   Title Patient will report a worst pain of less than 3/10 to demonstrate improved tolerance for ADLs.    Baseline 8/10   Time 3   Period Weeks   Status New               Plan - 14-Jul-2015 1052    Clinical Impression Statement Patient presents with pain around UT and SCM on L>R, which appears to be in part related to his posture. He has forward head posture, limiting his AROM, particularly in light of his posterior cervical fusion in January 2017. He reports some joint signs in thoracic spine with grade 1 mobilizations, and trigger points in UT/SCM/scalene musculature which resolves with STM. Patient reports "a world of difference" after session with no pain in any directional movement. Patient would benefit from skilled PT services to address ROM and functional deficits identified for return to recreational activities.    Pt will benefit from skilled therapeutic intervention in order to improve on the following deficits Pain;Decreased range of motion;Impaired UE functional use   Rehab Potential Good   Clinical Impairments Affecting Rehab Potential Excellent response to treatment session today, symptoms resolving with time in UEs   PT Frequency 1x / week   PT Duration 2 weeks   PT Treatment/Interventions ADLs/Self Care Home Management;Dry needling;Manual techniques;Therapeutic exercise;Therapeutic activities;Moist Heat;Cryotherapy   PT Next Visit Plan Manual techniques progressing to foam roller/active ROM    Consulted and Agree with  Plan of Care Patient          G-Codes - 07-14-15 1051    Functional Assessment Tool Used NDI, Clinical evaluation    Functional Limitation Carrying, moving and handling objects   Carrying, Moving and Handling Objects Current Status [id number]) At least 20 percent but less than 40 percent impaired, limited or restricted   Carrying, Moving and Handling Objects Goal Status [id number]) At least 1 percent but less than 20 percent impaired, limited or restricted       Problem List Patient Active Problem List   Diagnosis Date Noted  . S/P cervical spinal fusion 04/18/2015   Kerman Passey, PT, DPT    July 14, 2015, 1:45 PM  [place] [address], [place], [address] Phone: [phone]   Fax:  [phone]  Name: Gwyndolyn Saxon  DAKARRI COMPHER MRN: [id number] Date of Birth: [birth year]

## 2015-07-08 ENCOUNTER — Encounter: Payer: Medicaid Other | Admitting: Physical Therapy

## 2015-07-15 ENCOUNTER — Encounter: Payer: Medicaid Other | Admitting: Physical Therapy

## 2016-06-12 ENCOUNTER — Emergency Department
Admission: EM | Admit: 2016-06-12 | Discharge: 2016-06-12 | Disposition: A | Discharge status: Home / Self Care | Payer: Medicaid Other | Attending: Emergency Medicine | Admitting: Emergency Medicine

## 2016-06-12 ENCOUNTER — Encounter: Payer: Self-pay | Admitting: Emergency Medicine

## 2016-06-12 DIAGNOSIS — F17210 Nicotine dependence, cigarettes, uncomplicated: Secondary | ICD-10-CM | POA: Diagnosis not present

## 2016-06-12 DIAGNOSIS — I10 Essential (primary) hypertension: Secondary | ICD-10-CM | POA: Diagnosis not present

## 2016-06-12 DIAGNOSIS — S60561A Insect bite (nonvenomous) of right hand, initial encounter: Secondary | ICD-10-CM | POA: Diagnosis present

## 2016-06-12 DIAGNOSIS — Z7982 Long term (current) use of aspirin: Secondary | ICD-10-CM | POA: Insufficient documentation

## 2016-06-12 DIAGNOSIS — L03012 Cellulitis of left finger: Secondary | ICD-10-CM | POA: Diagnosis not present

## 2016-06-12 DIAGNOSIS — Y939 Activity, unspecified: Secondary | ICD-10-CM | POA: Diagnosis not present

## 2016-06-12 DIAGNOSIS — Y929 Unspecified place or not applicable: Secondary | ICD-10-CM | POA: Insufficient documentation

## 2016-06-12 DIAGNOSIS — Y999 Unspecified external cause status: Secondary | ICD-10-CM | POA: Insufficient documentation

## 2016-06-12 DIAGNOSIS — W57XXXA Bitten or stung by nonvenomous insect and other nonvenomous arthropods, initial encounter: Secondary | ICD-10-CM | POA: Insufficient documentation

## 2016-06-12 HISTORY — DX: Essential (primary) hypertension: I10

## 2016-06-12 MED ORDER — SULFAMETHOXAZOLE-TRIMETHOPRIM 800-160 MG PO TABS: 1.0000 | ORAL_TABLET | Freq: Two times a day (BID) | ORAL | 0 refills | Status: DC

## 2016-06-12 NOTE — Discharge Instructions (Signed)
See your primary care provider or return to the ER for symptoms that are not improving over the next 2-3 days or sooner if worsening.

## 2016-06-12 NOTE — ED Triage Notes (Signed)
States spider bite to L thumb 2 days ago, sore noted.

## 2016-06-12 NOTE — ED Provider Notes (Signed)
[facility] Emergency Department Provider Note  ____________________________________________  Time seen: Approximately 7:08 PM  I have reviewed the triage vital signs and the nursing notes.   HISTORY  Chief Complaint Hand Pain   HPI Jimmy Bishop is a 56 y.o. male who presents to the emergency department for evaluation of a "spider bite" that he noticed 2 days ago. He states pain, swelling, and redness has extended into his hand. He denies fever.   Past Medical History:  Diagnosis Date  . Arthritis    left knee  . Hypertension     Patient Active Problem List   Diagnosis Date Noted  . S/P cervical spinal fusion 04/18/2015    Past Surgical History:  Procedure Laterality Date  . ANKLE SURGERY    . POSTERIOR CERVICAL FUSION/FORAMINOTOMY N/A 04/18/2015   Procedure: Posterior cervical fusion with lateral mass fixation - Cervical three-Thoracic one, cervical laminectomy Cervical three-Cervical seven;  Surgeon: Eustace Moore, MD;  Location: [place] [facility];  Service: Neurosurgery;  Laterality: N/A;    Prior to Admission medications   Medication Sig Start Date End Date Taking? Authorizing Provider  aspirin EC 81 MG tablet Take 81 mg by mouth daily.    Historical Provider, MD  methocarbamol (ROBAXIN) 750 MG tablet Take 1 tablet (750 mg total) by mouth 4 (four) times daily. 04/20/15   Kevan Ny Ditty, MD  oxyCODONE-acetaminophen (PERCOCET/ROXICET) 5-325 MG tablet Take 1-2 tablets by mouth every 4 (four) hours as needed for moderate pain. 04/20/15   Kevan Ny Ditty, MD  sulfamethoxazole-trimethoprim (BACTRIM DS,SEPTRA DS) 800-160 MG tablet Take 1 tablet by mouth 2 (two) times daily. 06/12/16   Victorino Dike, FNP    Allergies Vicodin [hydrocodone-acetaminophen]  No family history on file.  Social History Social History  Substance Use Topics  . Smoking status: Current Every Day Smoker    Packs/day: 1.00    Years: 40.00    Types: Cigarettes  .  Smokeless tobacco: Not on file  . Alcohol use Yes     Comment: occ    Review of Systems  Constitutional: Negative for fever/chills Respiratory: Negative for shortness of breath. Musculoskeletal: Negative for pain. Skin: Positive for lesion to left thumb. Neurological: Negative for headaches, focal weakness or numbness. ____________________________________________   PHYSICAL EXAM:  VITAL SIGNS: ED Triage Vitals  Enc Vitals Group     BP 06/12/16 1745 131/65     Pulse Rate 06/12/16 1745 80     Resp 06/12/16 1745 18     Temp 06/12/16 1745 98.4 F (36.9 C)     Temp Source 06/12/16 1745 Oral     SpO2 06/12/16 1745 96 %     Weight 06/12/16 1745 192 lb (87.1 kg)     Height 06/12/16 1745 5\' 9"  (1.753 m)     Head Circumference --      Peak Flow --      Pain Score 06/12/16 1746 7     Pain Loc --      Pain Edu? --      Excl. in [place]? --      Constitutional: Alert and oriented. Well appearing and in no acute distress. Eyes: Conjunctivae are normal. EOMI. Nose: No congestion/rhinnorhea. Mouth/Throat: Mucous membranes are moist.   Neck: No stridor. Cardiovascular: Good peripheral circulation. Respiratory: Normal respiratory effort.  No retractions. Musculoskeletal: FROM throughout, specifically the left thumb. Neurologic:  Normal speech and language. No gross focal neurologic deficits are appreciated. Skin:  Ruptured lesion to the dorsal  aspect of the left thumb with surrounding erythema. No fluctuance or induration noted. No lymphangitis. Other digits do not appear involved at present.  ____________________________________________   LABS (all labs ordered are listed, but only abnormal results are displayed)  Labs Reviewed - No data to display ____________________________________________  EKG   ____________________________________________  RADIOLOGY   ____________________________________________   PROCEDURES  Procedure(s) performed:  None ____________________________________________   INITIAL IMPRESSION / ASSESSMENT AND PLAN / ED COURSE  56 year old male presenting to the emergency department for evaluation of a lesion on his left thumb that is suspected to be a spider bite and has been present for the past 2 days. He will be given a prescription for Bactrim and advised to return to the ER or see primary care in 2-3 days if not improving. He was instructed to return sooner for symptoms that change or worsen.  Pertinent labs & imaging results that were available during my care of the patient were reviewed by me and considered in my medical decision making (see chart for details).   ____________________________________________   FINAL CLINICAL IMPRESSION(S) / ED DIAGNOSES  Final diagnoses:  Cellulitis of thumb, left    Discharge Medication List as of 06/12/2016  7:01 PM    START taking these medications   Details  sulfamethoxazole-trimethoprim (BACTRIM DS,SEPTRA DS) 800-160 MG tablet Take 1 tablet by mouth 2 (two) times daily., Starting Sat 06/12/2016, Print        If controlled substance prescribed during this visit, 12 month history viewed on the [place] prior to issuing an initial prescription for Schedule II or III opiod.   Note:  This document was prepared using Dragon voice recognition software and may include unintentional dictation errors.    Victorino Dike, FNP 06/12/16 [place], MD 06/13/16 [phone]

## 2017-01-01 IMAGING — CR DG CHEST 2V
2 series · 2 of 2 positions shown · non-contrast
Comparison: None.

CLINICAL DATA: Cough today.  Smoker.

EXAM:
CHEST  2 VIEW

[w chest pa]
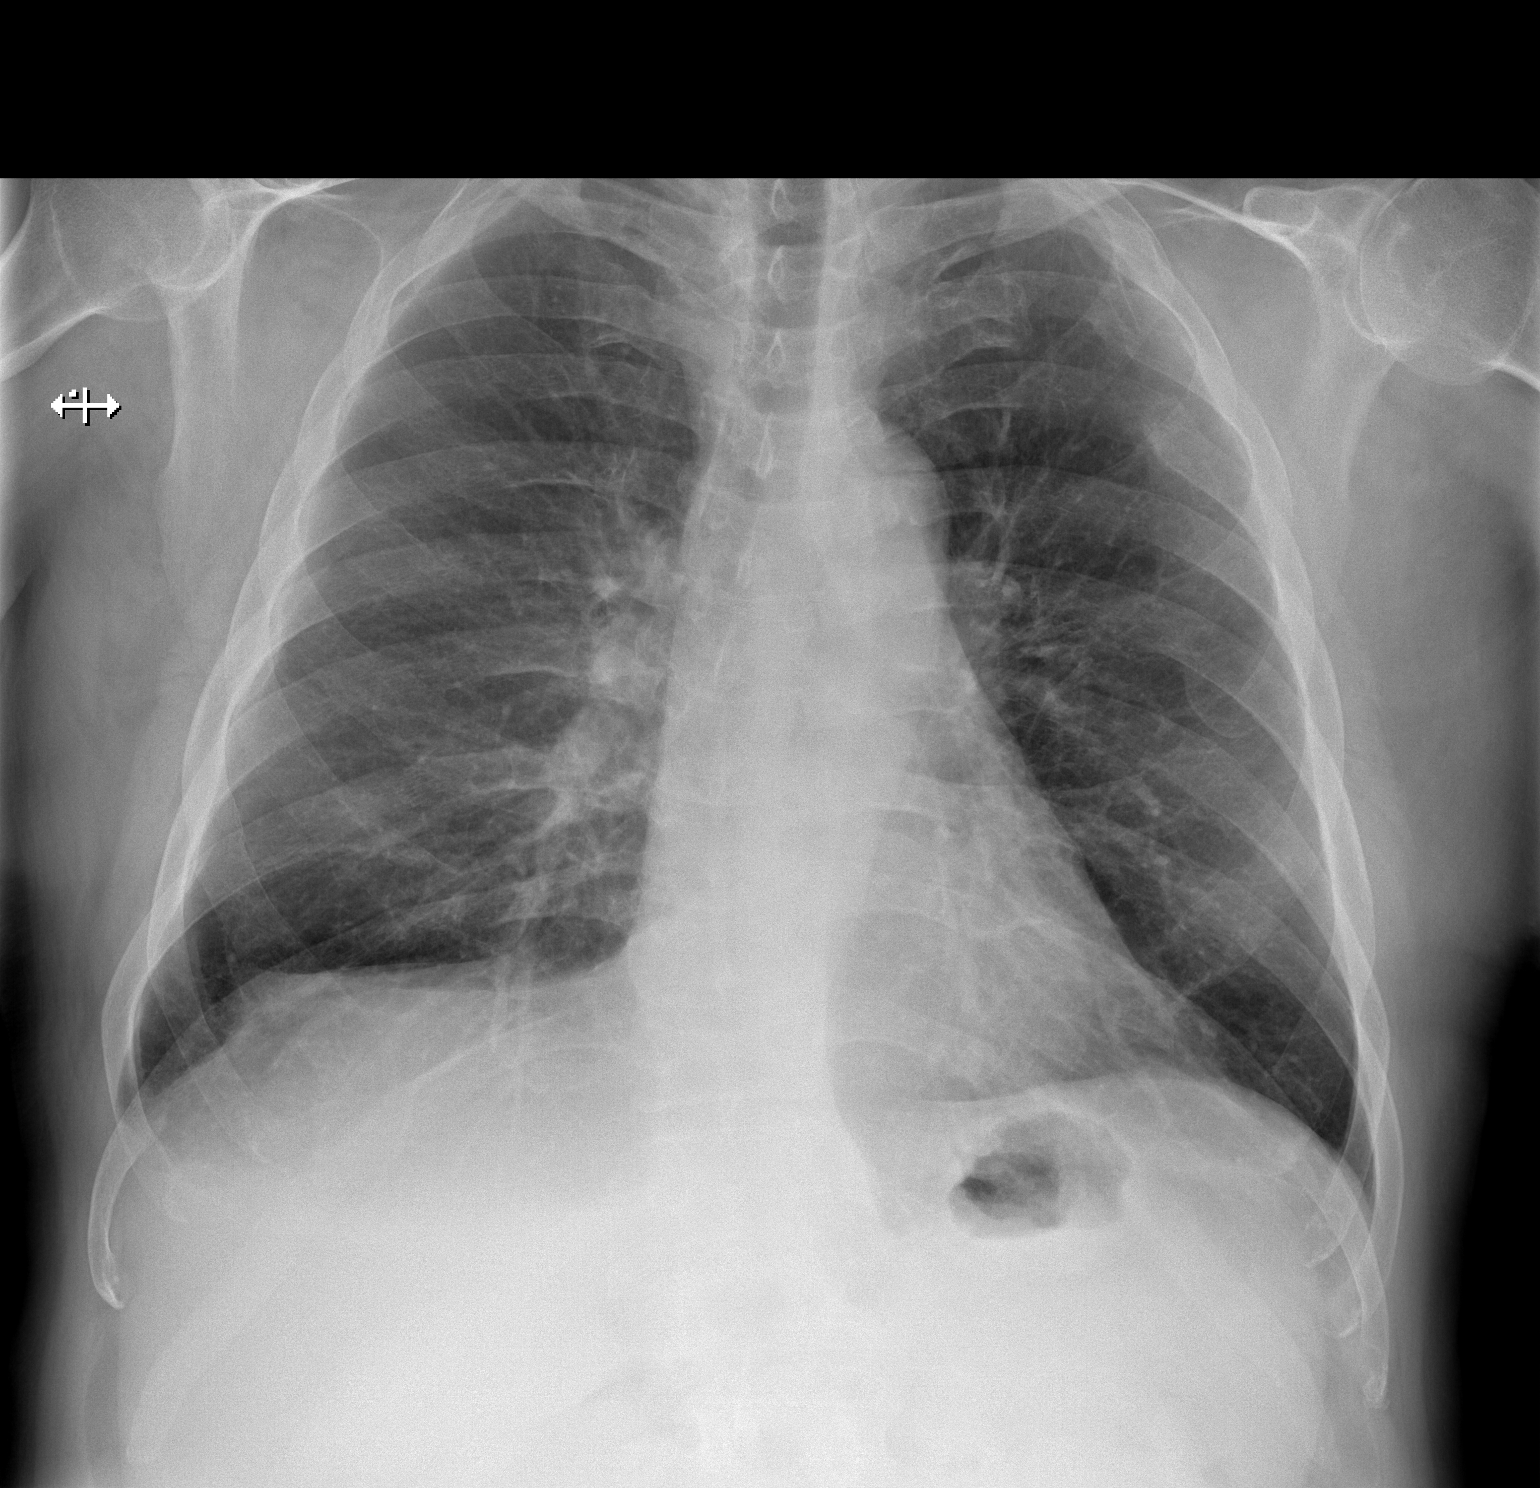

[w chest lat]
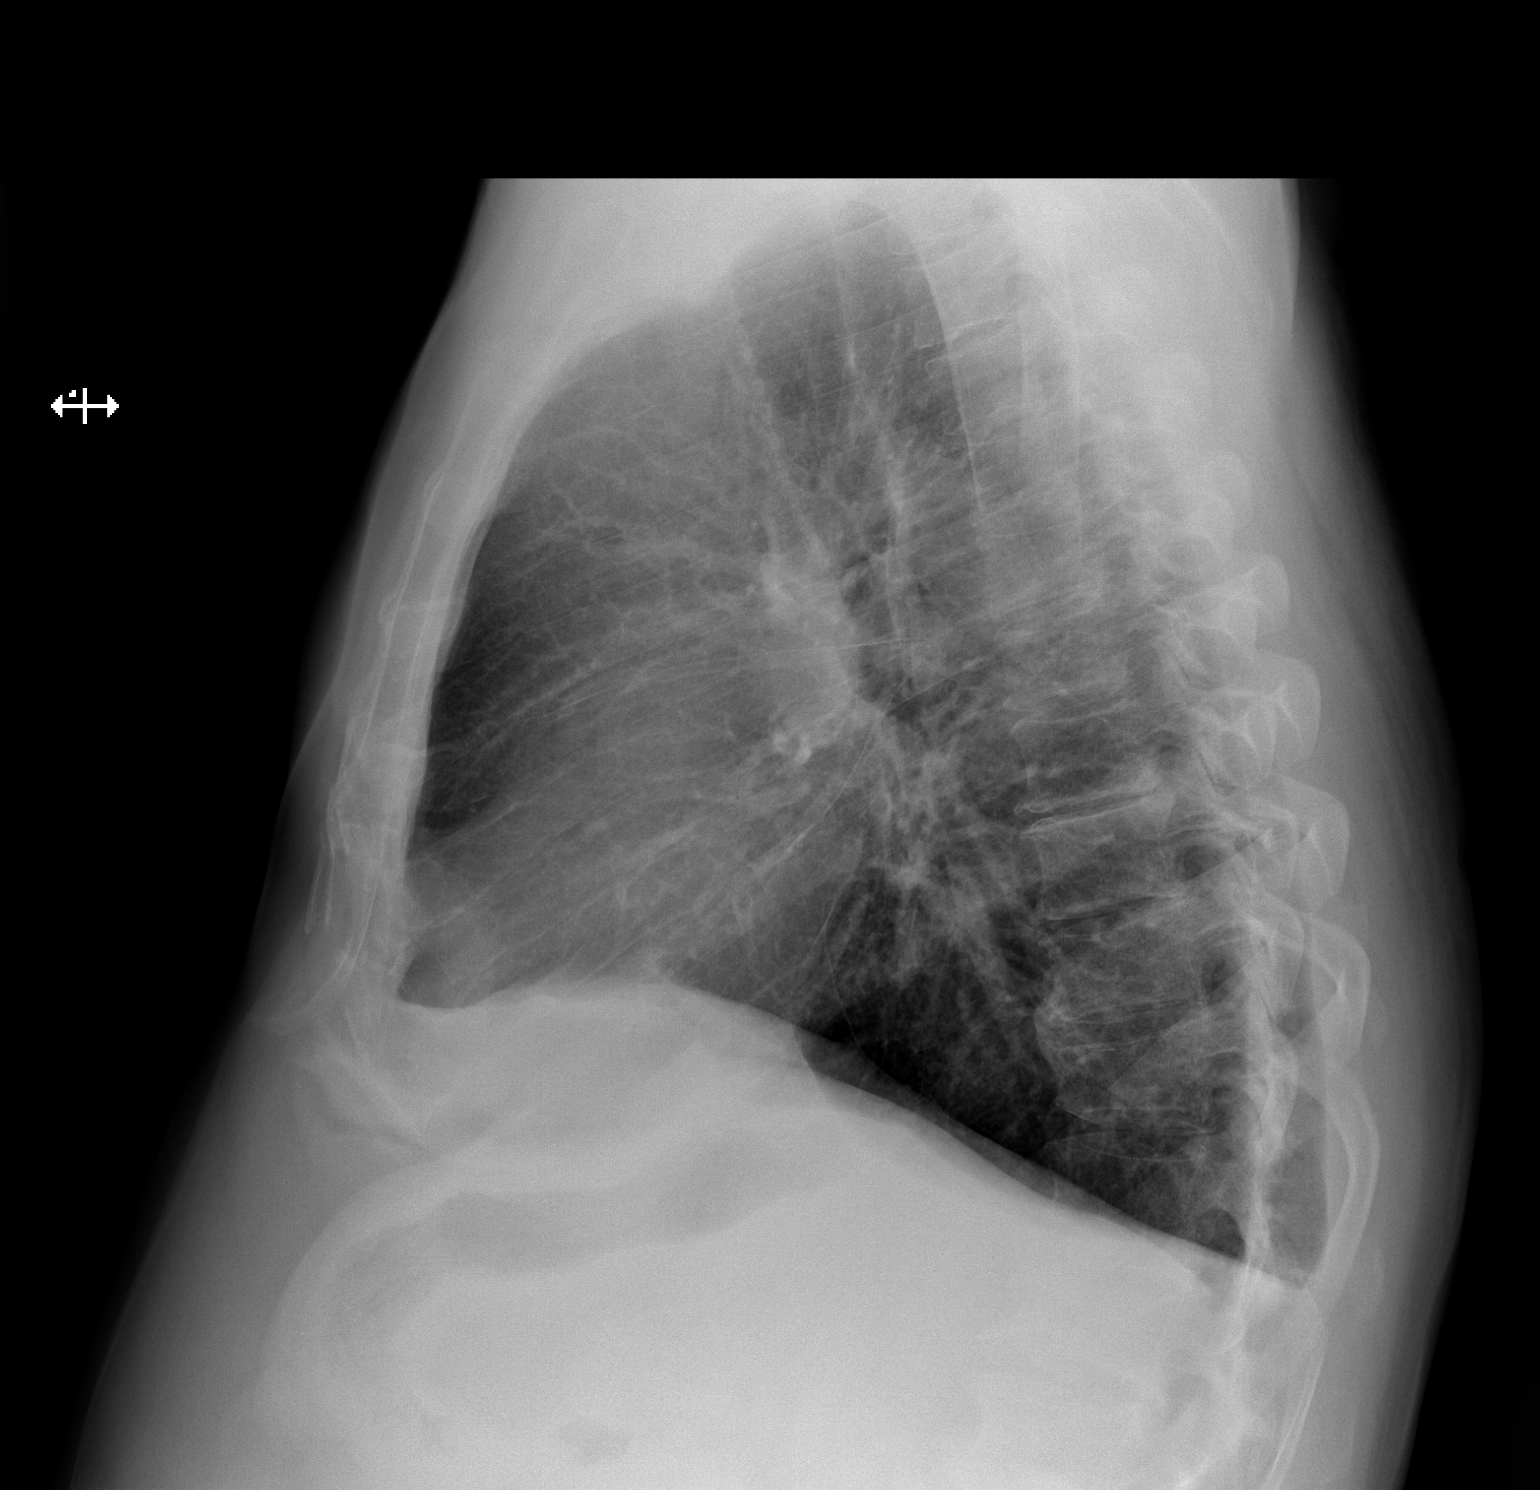

[2 of 2 positions shown; findings below may reference images not displayed]

FINDINGS: Normal sized heart. Clear lungs. Flattened hemidiaphragms on the
lateral view with minimally prominent interstitial markings. Mild
scoliosis. Lower thoracic spine degenerative changes.
IMPRESSION: No acute abnormality.  Mild changes of COPD.

## 2017-05-27 ENCOUNTER — Emergency Department: Payer: Medicaid Other

## 2017-05-27 ENCOUNTER — Emergency Department
Admission: EM | Admit: 2017-05-27 | Discharge: 2017-05-27 | Disposition: A | Discharge status: Home / Self Care | Payer: Medicaid Other | Attending: Student in an Organized Health Care Education/Training Program | Admitting: Student in an Organized Health Care Education/Training Program

## 2017-05-27 DIAGNOSIS — J209 Acute bronchitis, unspecified: Secondary | ICD-10-CM | POA: Diagnosis not present

## 2017-05-27 DIAGNOSIS — I10 Essential (primary) hypertension: Secondary | ICD-10-CM | POA: Insufficient documentation

## 2017-05-27 DIAGNOSIS — Z7982 Long term (current) use of aspirin: Secondary | ICD-10-CM | POA: Insufficient documentation

## 2017-05-27 DIAGNOSIS — R05 Cough: Secondary | ICD-10-CM | POA: Diagnosis present

## 2017-05-27 DIAGNOSIS — F17210 Nicotine dependence, cigarettes, uncomplicated: Secondary | ICD-10-CM | POA: Diagnosis not present

## 2017-05-27 MED ORDER — PREDNISONE 20 MG PO TABS
40.0000 mg | ORAL_TABLET | Freq: Once | ORAL | Status: AC
  Administered 2017-05-27: 40 mg via ORAL
  Filled 2017-05-27: qty 2

## 2017-05-27 MED ORDER — IPRATROPIUM-ALBUTEROL 0.5-2.5 (3) MG/3ML IN SOLN
3.0000 mL | Freq: Once | RESPIRATORY_TRACT | Status: AC
  Administered 2017-05-27: 3 mL via RESPIRATORY_TRACT
  Filled 2017-05-27: qty 3

## 2017-05-27 MED ORDER — ALBUTEROL SULFATE HFA 108 (90 BASE) MCG/ACT IN AERS: 2.0000 | INHALATION_SPRAY | Freq: Four times a day (QID) | RESPIRATORY_TRACT | 2 refills | Status: DC | PRN

## 2017-05-27 MED ORDER — PREDNISONE 10 MG PO TABS: ORAL_TABLET | ORAL | 0 refills | Status: DC

## 2017-05-27 MED ORDER — SULFAMETHOXAZOLE-TRIMETHOPRIM 800-160 MG PO TABS: 1.0000 | ORAL_TABLET | Freq: Two times a day (BID) | ORAL | 0 refills | Status: DC

## 2017-05-27 NOTE — ED Triage Notes (Signed)
Pt reports that he has been coughing for past 3 days.  Pt states that it hurts for him to breathe and that its hard for him to breathe when he lays flat.  Pt states that he has had upper respiratory symptoms.  Pt states that he still smokes half a pack of cigarettes per day as well.  Pt's breathing even and non-labored at this time.  Pt reports that his ribs hurt when he takes a deep breath.

## 2017-05-27 NOTE — ED Provider Notes (Signed)
[facility] Emergency Department Provider Note  ___________________________________________   First MD Initiated Contact with Patient 05/27/17 2032     (approximate)  I have reviewed the triage vital signs and the nursing notes.   HISTORY  Chief Complaint Cough and URI   HPI Jimmy Bishop is a 56 y.o. male is here with complaint of coughing for the past 4-5 days.  There are other family members who live with him that have similar symptoms.  Patient states that occasionally he has productive phlegm.  He is unaware of any fever or chills.  He denies any nausea or vomiting.  He has been taking some Alka-Seltzer with out relief.  He reports rib pain with taking deep breaths and coughing.  Patient continues to smoke but states he is cut down from 1 pack of cigarettes per day to half a pack of cigarettes per day.  He rates his pain is 7 out of 10.   Past Medical History:  Diagnosis Date  . Arthritis    left knee  . Hypertension     Patient Active Problem List   Diagnosis Date Noted  . S/P cervical spinal fusion 04/18/2015    Past Surgical History:  Procedure Laterality Date  . ANKLE SURGERY    . POSTERIOR CERVICAL FUSION/FORAMINOTOMY N/A 04/18/2015   Procedure: Posterior cervical fusion with lateral mass fixation - Cervical three-Thoracic one, cervical laminectomy Cervical three-Cervical seven;  Surgeon: Eustace Moore, MD;  Location: [place] [facility];  Service: Neurosurgery;  Laterality: N/A;    Prior to Admission medications   Medication Sig Start Date End Date Taking? Authorizing Provider  albuterol (PROVENTIL HFA;VENTOLIN HFA) 108 (90 Base) MCG/ACT inhaler Inhale 2 puffs into the lungs every 6 (six) hours as needed for wheezing or shortness of breath. 05/27/17   [name], PA-C  aspirin EC 81 MG tablet Take 81 mg by mouth daily.    [provider]  oxyCODONE-acetaminophen (PERCOCET/ROXICET) 5-325 MG tablet Take 1-2 tablets by mouth every  4 (four) hours as needed for moderate pain. 04/20/15   Ditty, Kevan Ny, MD  predniSONE (DELTASONE) 10 MG tablet Take 3 tablets once a day for 3 days starting Saturday 05/27/17   [name], PA-C  sulfamethoxazole-trimethoprim (BACTRIM DS,SEPTRA DS) 800-160 MG tablet Take 1 tablet by mouth 2 (two) times daily. 05/27/17   [name], PA-C    Allergies Vicodin [hydrocodone-acetaminophen]  No family history on file.  Social History Social History   Tobacco Use  . Smoking status: Current Every Day Smoker    Packs/day: 1.00    Years: 40.00    Pack years: 40.00    Types: Cigarettes  . Smokeless tobacco: Never Used  Substance Use Topics  . Alcohol use: Yes    Comment: occ  . Drug use: No    Review of Systems Constitutional: No fever/chills Eyes: No visual changes. ENT: No sore throat. Cardiovascular: Denies chest pain. Respiratory: Positive for shortness of breath.  Positive for productive cough. Gastrointestinal: No abdominal pain.  No nausea, no vomiting.  No diarrhea.   Musculoskeletal: Positive for bilateral rib pain due to coughing. Skin: Negative for rash. Neurological: Negative for headaches, focal weakness or numbness. ____________________________________________   PHYSICAL EXAM:  VITAL SIGNS: ED Triage Vitals [05/27/17 1854]  Enc Vitals Group     BP 139/82     Pulse Rate 87     Resp 18     Temp 98 F (36.7 C)  Temp src      SpO2 94 %     Weight 192 lb (87.1 kg)     Height      Head Circumference      Peak Flow      Pain Score 7     Pain Loc      Pain Edu?      Excl. in [place]?    Constitutional: Alert and oriented. Well appearing and in no acute distress. Eyes: Conjunctivae are normal.  Head: Atraumatic. Nose: No congestion/rhinnorhea. Mouth/Throat: Mucous membranes are moist.  Oropharynx non-erythematous. Neck: No stridor.   Hematological/Lymphatic/Immunilogical: No cervical lymphadenopathy. Cardiovascular: Normal rate, regular  rhythm. Grossly normal heart sounds.  Good peripheral circulation. Respiratory: Normal respiratory effort.  No retractions. Lungs expiratory wheezes are heard throughout with a very congestive cough consistent with bronchitis.  Patient is able to talk in complete sentences without any difficulty. Musculoskeletal: Moves upper and lower extremities without any difficulty. Neurologic:  Normal speech and language. No gross focal neurologic deficits are appreciated. No gait instability. Skin:  Skin is warm, dry and intact. No rash noted. Psychiatric: Mood and affect are normal. Speech and behavior are normal.  ____________________________________________   LABS (all labs ordered are listed, but only abnormal results are displayed)  Labs Reviewed - No data to display  RADIOLOGY  ED MD interpretation:   Chest x-ray is negative for pneumonia.  Official radiology report(s): Dg Chest 2 View  Result Date: 05/27/2017 CLINICAL DATA:  Pt reports that he has been coughing for past 3 days. Pt states that it hurts for him to breathe and that its hard for him to breathe when he lays flat. Pt states that he has had upper respiratory symptoms. Pt states that he still smokes half a pack of cigarettes per day as well EXAM: CHEST - 2 VIEW COMPARISON:  04/10/2015 FINDINGS: Cardiac silhouette is normal in size. No mediastinal or hilar masses or evidence of adenopathy. Lungs are clear. No pleural effusion or pneumothorax. There has been a previous posterior cervical spine fusion since the prior exam. There old healed left rib fractures. Moderate compression fracture of the upper lumbar spine, stable. IMPRESSION: No acute cardiopulmonary disease. Electronically Signed   By: Lajean Manes M.D.   On: 05/27/2017 19:44    ____________________________________________   PROCEDURES  Procedure(s) performed: None  Procedures  Critical Care performed: No  ____________________________________________   INITIAL  IMPRESSION / ASSESSMENT AND PLAN / ED COURSE Patient was given prednisone while in the department along with a DuoNeb treatment.  Patient was able to cough up flame and coughing decreased.  Air exchange was improved.  We discussed he should discontinue smoking.  Family member also agrees.  He will follow-up with [facility] acute care or open[facility]door clinic.  He was encouraged to establish a PCP with 1 of the clinics listed on his discharge papers.  Patient was discharged with a prescription for prednisone and albuterol inhaler.  ____________________________________________   FINAL CLINICAL IMPRESSION(S) / ED DIAGNOSES  Final diagnoses:  Acute bronchitis, unspecified organism  Cigarette smoker     ED Discharge Orders        Ordered    sulfamethoxazole-trimethoprim (BACTRIM DS,SEPTRA DS) 800-160 MG tablet  2 times daily     05/27/17 2230    predniSONE (DELTASONE) 10 MG tablet     05/27/17 2230    albuterol (PROVENTIL HFA;VENTOLIN HFA) 108 (90 Base) MCG/ACT inhaler  Every 6 hours PRN     05/27/17 2230  Note:  This document was prepared using Dragon voice recognition software and may include unintentional dictation errors.    [name], PA-C 05/27/17 2304    Merlyn Lot, MD 05/27/17 [phone]

## 2017-05-27 NOTE — Discharge Instructions (Signed)
Follow-up with [facility] acute care or consider going to the open-door clinic to establish a primary care provider.  Begin taking prednisone Saturday as directed for the next 3 days.  Bactrim DS twice daily for 10 days. Albuterol inhaler as needed for wheezing. Discontinue smoking.  Increase fluids.  Return to the emergency department if any severe worsening of your symptoms over the weekend.

## 2017-07-19 ENCOUNTER — Encounter: Payer: Self-pay | Admitting: *Deleted

## 2017-07-20 ENCOUNTER — Encounter
Admission: RE | Disposition: A | Discharge status: Home / Self Care | Payer: Self-pay | Source: Ambulatory Visit | Attending: Internal Medicine

## 2017-07-20 ENCOUNTER — Ambulatory Visit
Admission: RE | Admit: 2017-07-20 | Discharge: 2017-07-20 | Disposition: A | Discharge status: Home / Self Care | Payer: Medicaid Other | Source: Ambulatory Visit | Attending: Internal Medicine | Admitting: Internal Medicine

## 2017-07-20 ENCOUNTER — Encounter: Payer: Self-pay | Admitting: *Deleted

## 2017-07-20 ENCOUNTER — Ambulatory Visit: Payer: Medicaid Other | Admitting: Anesthesiology

## 2017-07-20 DIAGNOSIS — Z79899 Other long term (current) drug therapy: Secondary | ICD-10-CM | POA: Diagnosis not present

## 2017-07-20 DIAGNOSIS — D125 Benign neoplasm of sigmoid colon: Secondary | ICD-10-CM | POA: Diagnosis not present

## 2017-07-20 DIAGNOSIS — F17200 Nicotine dependence, unspecified, uncomplicated: Secondary | ICD-10-CM | POA: Insufficient documentation

## 2017-07-20 DIAGNOSIS — D124 Benign neoplasm of descending colon: Secondary | ICD-10-CM | POA: Insufficient documentation

## 2017-07-20 DIAGNOSIS — D123 Benign neoplasm of transverse colon: Secondary | ICD-10-CM | POA: Diagnosis not present

## 2017-07-20 DIAGNOSIS — Z1211 Encounter for screening for malignant neoplasm of colon: Secondary | ICD-10-CM | POA: Insufficient documentation

## 2017-07-20 DIAGNOSIS — M1712 Unilateral primary osteoarthritis, left knee: Secondary | ICD-10-CM | POA: Diagnosis not present

## 2017-07-20 DIAGNOSIS — J449 Chronic obstructive pulmonary disease, unspecified: Secondary | ICD-10-CM | POA: Insufficient documentation

## 2017-07-20 DIAGNOSIS — Z885 Allergy status to narcotic agent status: Secondary | ICD-10-CM | POA: Diagnosis not present

## 2017-07-20 DIAGNOSIS — I10 Essential (primary) hypertension: Secondary | ICD-10-CM | POA: Insufficient documentation

## 2017-07-20 DIAGNOSIS — Z7982 Long term (current) use of aspirin: Secondary | ICD-10-CM | POA: Diagnosis not present

## 2017-07-20 HISTORY — PX: COLONOSCOPY WITH PROPOFOL: SHX5780

## 2017-07-20 SURGERY — COLONOSCOPY WITH PROPOFOL
Anesthesia: General

## 2017-07-20 MED ORDER — PROPOFOL 10 MG/ML IV BOLUS
INTRAVENOUS | Status: DC | PRN
  Administered 2017-07-20 (×2): 100 mg via INTRAVENOUS

## 2017-07-20 MED ORDER — PROPOFOL 10 MG/ML IV BOLUS
INTRAVENOUS | Status: AC
  Filled 2017-07-20: qty 20

## 2017-07-20 MED ORDER — PROPOFOL 500 MG/50ML IV EMUL
INTRAVENOUS | Status: DC | PRN
  Administered 2017-07-20: 110 ug/kg/min via INTRAVENOUS

## 2017-07-20 MED ORDER — SODIUM CHLORIDE 0.9 % IV SOLN
INTRAVENOUS | Status: DC
  Administered 2017-07-20: 14:00:00 via INTRAVENOUS

## 2017-07-20 MED ORDER — LIDOCAINE HCL (CARDIAC) PF 100 MG/5ML IV SOSY
PREFILLED_SYRINGE | INTRAVENOUS | Status: DC | PRN
  Administered 2017-07-20: 100 mg via INTRAVENOUS

## 2017-07-20 MED ORDER — PROPOFOL 10 MG/ML IV BOLUS
INTRAVENOUS | Status: AC
  Filled 2017-07-20: qty 40

## 2017-07-20 NOTE — H&P (Signed)
Outpatient short stay form Pre-procedure 07/20/2017 1:46 PM Jimmy Bishop, M.D.  Primary Physician: Anthonette Legato, M.D.  Reason for visit: Colon cancer screening.   History of present illness:  Patient presents for colonoscopy for screening. The patient denies complaints of abdominal pain, significant change in bowel habits, or rectal bleeding.     Current Facility-Administered Medications:  .  0.9 %  sodium chloride infusion, , Intravenous, Continuous, [place], Benay Pike, MD  Medications Prior to Admission  Medication Sig Dispense Refill Last Dose  . albuterol (PROVENTIL HFA;VENTOLIN HFA) 108 (90 Base) MCG/ACT inhaler Inhale 2 puffs into the lungs every 6 (six) hours as needed for wheezing or shortness of breath. 1 Inhaler 2   . aspirin EC 81 MG tablet Take 81 mg by mouth daily.   04/10/2015  . oxyCODONE-acetaminophen (PERCOCET/ROXICET) 5-325 MG tablet Take 1-2 tablets by mouth every 4 (four) hours as needed for moderate pain. 90 tablet 0   . predniSONE (DELTASONE) 10 MG tablet Take 3 tablets once a day for 3 days starting Saturday 9 tablet 0   . sulfamethoxazole-trimethoprim (BACTRIM DS,SEPTRA DS) 800-160 MG tablet Take 1 tablet by mouth 2 (two) times daily. 20 tablet 0      Allergies  Allergen Reactions  . Vicodin [Hydrocodone-Acetaminophen] Hives and Rash     Past Medical History:  Diagnosis Date  . Arthritis    left knee  . Hypertension     Review of systems:   Otherewise negative.    Physical Exam  Gen: Alert, oriented. Appears stated age.  HEENT: /AT. PERRLA. Lungs: CTA, no wheezes. CV: RR nl S1, S2. Abd: soft, benign, no masses. BS+ Ext: No edema. Pulses 2+    Planned procedures: Colonoscopy. The patient understands the nature of the planned procedure, indications, risks, alternatives and potential complications including but not limited to bleeding, infection, perforation, damage to internal organs and possible oversedation/side effects from  anesthesia. The patient agrees and gives consent to proceed.  Please refer to procedure notes for findings, recommendations and patient disposition/instructions.    Jimmy Bishop, M.D. Gastroenterology 07/20/2017  1:46 PM

## 2017-07-20 NOTE — Anesthesia Preprocedure Evaluation (Signed)
Anesthesia Evaluation  Patient identified by MRN, date of birth, ID band Patient awake    Reviewed: Allergy & Precautions, NPO status , Patient's Chart, lab work & pertinent test results  History of Anesthesia Complications Negative for: history of anesthetic complications  Airway Mallampati: III       Dental  (+) Edentulous Upper, Edentulous Lower   Pulmonary neg sleep apnea, COPD (not using inhalers x 2 months),  COPD inhaler, Current Smoker,           Cardiovascular hypertension, Pt. on medications (-) Past MI and (-) CHF (-) dysrhythmias (-) Valvular Problems/Murmurs     Neuro/Psych neg Seizures    GI/Hepatic Neg liver ROS, neg GERD  ,  Endo/Other  neg diabetes  Renal/GU negative Renal ROS     Musculoskeletal   Abdominal   Peds  Hematology   Anesthesia Other Findings   Reproductive/Obstetrics                             Anesthesia Physical Anesthesia Plan  ASA: III  Anesthesia Plan: General   Post-op Pain Management:    Induction: Intravenous  PONV Risk Score and Plan: 1 and TIVA and Propofol infusion  Airway Management Planned: Nasal Cannula  Additional Equipment:   Intra-op Plan:   Post-operative Plan:   Informed Consent: I have reviewed the patients History and Physical, chart, labs and discussed the procedure including the risks, benefits and alternatives for the proposed anesthesia with the patient or authorized representative who has indicated his/her understanding and acceptance.     Plan Discussed with:   Anesthesia Plan Comments:         Anesthesia Quick Evaluation

## 2017-07-20 NOTE — Transfer of Care (Signed)
Immediate Anesthesia Transfer of Care Note  Patient: Jimmy Bishop  Procedure(s) Performed: COLONOSCOPY WITH PROPOFOL (N/A )  Patient Location: PACU  Anesthesia Type:General  Level of Consciousness: sedated  Airway & Oxygen Therapy: Patient Spontanous Breathing and Patient connected to nasal cannula oxygen  Post-op Assessment: Report given to RN and Post -op Vital signs reviewed and stable  Post vital signs: Reviewed and stable  Last Vitals:  Vitals Value Taken Time  BP 115/73 07/20/2017  3:01 PM  Temp 36.2 C 07/20/2017  3:01 PM  Pulse 75 07/20/2017  3:02 PM  Resp 18 07/20/2017  3:02 PM  SpO2 100 % 07/20/2017  3:02 PM  Vitals shown include unvalidated device data.  Last Pain:  Vitals:   07/20/17 1501  TempSrc: Tympanic  PainSc: Asleep         Complications: No apparent anesthesia complications

## 2017-07-20 NOTE — Op Note (Signed)
[facility] Gastroenterology Patient Name: Jacorion Klem Procedure Date: 07/20/2017 2:30 PM MRN: [id number] Account #: [id number] Date of Birth: Feb 12, 1961 Admit Type: Outpatient Age: 57 Room: [facility] ENDO ROOM 3 Gender: Male Note Status: Finalized Procedure:            Colonoscopy Indications:          Screening for colorectal malignant neoplasm Providers:            Benay Pike. Alice Reichert MD, MD Referring MD:         No Local Md, MD (Referring MD) Medicines:            Propofol per Anesthesia Complications:        No immediate complications. Procedure:            Pre-Anesthesia Assessment:                       - The risks and benefits of the procedure and the                        sedation options and risks were discussed with the                        patient. All questions were answered and informed                        consent was obtained.                       - Patient identification and proposed procedure were                        verified prior to the procedure by the nurse. The                        procedure was verified in the procedure room.                       - ASA Grade Assessment: III - A patient with severe                        systemic disease.                       - After reviewing the risks and benefits, the patient                        was deemed in satisfactory condition to undergo the                        procedure.                       After obtaining informed consent, the colonoscope was                        passed under direct vision. Throughout the procedure,                        the patient's blood pressure, pulse, and oxygen  saturations were monitored continuously. The                        Colonoscope was introduced through the anus and                        advanced to the the cecum, identified by appendiceal                        orifice and ileocecal valve. The quality of the bowel                    preparation was good. Findings:      The perianal and digital rectal examinations were normal. Pertinent       negatives include normal sphincter tone and no palpable rectal lesions.      Two sessile polyps were found in the transverse colon. The polyps were 3       to 5 mm in size. These polyps were removed with a cold snare. Resection       and retrieval were complete.      A 5 mm polyp was found in the proximal sigmoid colon. The polyp was       sessile. The polyp was removed with a cold snare. Resection and       retrieval were complete.      Three sessile polyps were found in the distal sigmoid colon. The polyps       were 3 to 5 mm in size. These polyps were removed with a jumbo cold       forceps. Resection and retrieval were complete.      The exam was otherwise without abnormality. Impression:           - Two 3 to 5 mm polyps in the transverse colon, removed                        with a cold snare. Resected and retrieved.                       - One 5 mm polyp in the proximal sigmoid colon, removed                        with a cold snare. Resected and retrieved.                       - Three 3 to 5 mm polyps in the distal sigmoid colon,                        removed with a jumbo cold forceps. Resected and                        retrieved.                       - The examination was otherwise normal. Recommendation:       - Patient has a contact number available for                        emergencies. The signs and symptoms of potential  delayed complications were discussed with the patient.                        Return to normal activities tomorrow. Written discharge                        instructions were provided to the patient.                       - Resume previous diet.                       - Continue present medications.                       - Await pathology results.                       - Repeat colonoscopy is recommended  for surveillance.                        The colonoscopy date will be determined after pathology                        results from today's exam become available for review.                       - The findings and recommendations were discussed with                        the patient and their family. Procedure Code(s):    --- Professional ---                       [phone], Colonoscopy, flexible; with removal of tumor(s),                        polyp(s), or other lesion(s) by snare technique                       45380, 44, Colonoscopy, flexible; with biopsy, single                        or multiple Diagnosis Code(s):    --- Professional ---                       Z12.11, Encounter for screening for malignant neoplasm                        of colon                       D12.3, Benign neoplasm of transverse colon (hepatic                        flexure or splenic flexure)                       D12.5, Benign neoplasm of sigmoid colon CPT copyright 2017 American Medical Association. All rights reserved. The codes documented in this report are preliminary and upon coder review may  be revised to meet current compliance requirements. Efrain Sella MD, MD 07/20/2017 3:03:10 PM This  report has been signed electronically. Number of Addenda: 0 Note Initiated On: 07/20/2017 2:30 PM Scope Withdrawal Time: 0 hours 13 minutes 3 seconds  Total Procedure Duration: 0 hours 16 minutes 41 seconds       [facility]

## 2017-07-20 NOTE — Anesthesia Post-op Follow-up Note (Signed)
Anesthesia QCDR form completed.        

## 2017-07-21 ENCOUNTER — Encounter: Payer: Self-pay | Admitting: Internal Medicine

## 2017-07-22 LAB — SURGICAL PATHOLOGY

## 2017-07-22 NOTE — Anesthesia Postprocedure Evaluation (Signed)
Anesthesia Post Note  Patient: Jimmy Bishop  Procedure(s) Performed: COLONOSCOPY WITH PROPOFOL (N/A )  Patient location during evaluation: Endoscopy Anesthesia Type: General Level of consciousness: awake and alert Pain management: pain level controlled Vital Signs Assessment: post-procedure vital signs reviewed and stable Respiratory status: spontaneous breathing, nonlabored ventilation and respiratory function stable Cardiovascular status: blood pressure returned to baseline and stable Postop Assessment: no apparent nausea or vomiting Anesthetic complications: no     Last Vitals:  Vitals:   07/20/17 1511 07/20/17 1521  BP: 104/90 (!) 123/98  Pulse: 80 76  Resp: (!) 27 15  Temp:    SpO2: 100% 100%    Last Pain:  Vitals:   07/20/17 1511  TempSrc:   PainSc: 0-No pain                 Jimmy Bishop

## 2017-07-31 ENCOUNTER — Encounter: Payer: Self-pay | Admitting: Emergency Medicine

## 2017-07-31 ENCOUNTER — Emergency Department: Payer: Medicaid Other

## 2017-07-31 ENCOUNTER — Emergency Department
Admission: EM | Admit: 2017-07-31 | Discharge: 2017-07-31 | Disposition: A | Discharge status: Home / Self Care | Payer: Medicaid Other | Attending: Emergency Medicine | Admitting: Emergency Medicine

## 2017-07-31 DIAGNOSIS — F17210 Nicotine dependence, cigarettes, uncomplicated: Secondary | ICD-10-CM | POA: Insufficient documentation

## 2017-07-31 DIAGNOSIS — Y999 Unspecified external cause status: Secondary | ICD-10-CM | POA: Insufficient documentation

## 2017-07-31 DIAGNOSIS — W010XXA Fall on same level from slipping, tripping and stumbling without subsequent striking against object, initial encounter: Secondary | ICD-10-CM | POA: Insufficient documentation

## 2017-07-31 DIAGNOSIS — Y92018 Other place in single-family (private) house as the place of occurrence of the external cause: Secondary | ICD-10-CM | POA: Diagnosis not present

## 2017-07-31 DIAGNOSIS — S8012XA Contusion of left lower leg, initial encounter: Secondary | ICD-10-CM | POA: Diagnosis not present

## 2017-07-31 DIAGNOSIS — S8992XA Unspecified injury of left lower leg, initial encounter: Secondary | ICD-10-CM | POA: Diagnosis present

## 2017-07-31 DIAGNOSIS — I10 Essential (primary) hypertension: Secondary | ICD-10-CM | POA: Diagnosis not present

## 2017-07-31 DIAGNOSIS — Y9389 Activity, other specified: Secondary | ICD-10-CM | POA: Diagnosis not present

## 2017-07-31 DIAGNOSIS — Z79899 Other long term (current) drug therapy: Secondary | ICD-10-CM | POA: Insufficient documentation

## 2017-07-31 DIAGNOSIS — Z7982 Long term (current) use of aspirin: Secondary | ICD-10-CM | POA: Insufficient documentation

## 2017-07-31 MED ORDER — MELOXICAM 15 MG PO TABS: 15.0000 mg | ORAL_TABLET | Freq: Every day | ORAL | 0 refills | Status: DC

## 2017-07-31 MED ORDER — OXYCODONE-ACETAMINOPHEN 5-325 MG PO TABS
ORAL_TABLET | ORAL | Status: AC
  Filled 2017-07-31: qty 1

## 2017-07-31 MED ORDER — OXYCODONE-ACETAMINOPHEN 5-325 MG PO TABS: 1.0000 | ORAL_TABLET | Freq: Four times a day (QID) | ORAL | 0 refills | Status: DC | PRN

## 2017-07-31 MED ORDER — OXYCODONE-ACETAMINOPHEN 5-325 MG PO TABS
1.0000 | ORAL_TABLET | Freq: Once | ORAL | Status: AC
  Administered 2017-07-31: 1 via ORAL
  Filled 2017-07-31: qty 1

## 2017-07-31 NOTE — ED Provider Notes (Signed)
[facility] Emergency Department Provider Note  ____________________________________________  Time seen: Approximately 8:09 PM  I have reviewed the triage vital signs and the nursing notes.   HISTORY  Chief Complaint Foot Injury    HPI Jimmy Bishop is a 57 y.o. male who presents emergency department complaining of left lower extremity injury.  Patient reports that he was at home, slipped and twisted his left lower leg and ankle.  Patient reports that he previously fractured his ankle and lower leg.  This was a long recovery.  Patient reports that he is having exquisite pain in the distal tibia region.  No pain to the ankle or foot though he does have ecchymosis to the left medial foot.  Patient did not hit his head or lose consciousness during this injury.  No medications for this complaint prior to arrival.  No other complaints at this time.  Past Medical History:  Diagnosis Date  . Arthritis    left knee  . Hypertension     Patient Active Problem List   Diagnosis Date Noted  . S/P cervical spinal fusion 04/18/2015    Past Surgical History:  Procedure Laterality Date  . ANKLE SURGERY    . COLONOSCOPY WITH PROPOFOL N/A 07/20/2017   Procedure: COLONOSCOPY WITH PROPOFOL;  Surgeon: Toledo, Benay Pike, MD;  Location: [facility] ENDOSCOPY;  Service: Gastroenterology;  Laterality: N/A;  . POSTERIOR CERVICAL FUSION/FORAMINOTOMY N/A 04/18/2015   Procedure: Posterior cervical fusion with lateral mass fixation - Cervical three-Thoracic one, cervical laminectomy Cervical three-Cervical seven;  Surgeon: Eustace Moore, MD;  Location: [place] [facility];  Service: Neurosurgery;  Laterality: N/A;    Prior to Admission medications   Medication Sig Start Date End Date Taking? Authorizing Provider  albuterol (PROVENTIL HFA;VENTOLIN HFA) 108 (90 Base) MCG/ACT inhaler Inhale 2 puffs into the lungs every 6 (six) hours as needed for wheezing or shortness of breath. Patient not taking:  Reported on 07/20/2017 05/27/17   [name], PA-C  aspirin EC 81 MG tablet Take 81 mg by mouth daily.    [provider]  meloxicam (MOBIC) 15 MG tablet Take 1 tablet (15 mg total) by mouth daily. 07/31/17   Clinton Wahlberg, Charline Bills, PA-C  oxyCODONE-acetaminophen (PERCOCET/ROXICET) 5-325 MG tablet Take 1 tablet by mouth every 6 (six) hours as needed for severe pain. 07/31/17   Tawney Vanorman, Charline Bills, PA-C  predniSONE (DELTASONE) 10 MG tablet Take 3 tablets once a day for 3 days starting Saturday Patient not taking: Reported on 07/20/2017 05/27/17   [name], PA-C  sulfamethoxazole-trimethoprim (BACTRIM DS,SEPTRA DS) 800-160 MG tablet Take 1 tablet by mouth 2 (two) times daily. Patient not taking: Reported on 07/20/2017 05/27/17   [name], PA-C    Allergies Vicodin [hydrocodone-acetaminophen]  History reviewed. No pertinent family history.  Social History Social History   Tobacco Use  . Smoking status: Current Every Day Smoker    Packs/day: 1.00    Years: 40.00    Pack years: 40.00    Types: Cigarettes  . Smokeless tobacco: Never Used  Substance Use Topics  . Alcohol use: Yes    Comment: occ  . Drug use: No     Review of Systems  Constitutional: No fever/chills Eyes: No visual changes. Cardiovascular: no chest pain. Respiratory: no cough. No SOB. Gastrointestinal: No abdominal pain.  No nausea, no vomiting.   Musculoskeletal: Positive for left lower extremity injury/pain Skin: Negative for rash, abrasions, lacerations, ecchymosis. Neurological: Negative for headaches, focal weakness or numbness.  10-point ROS otherwise negative.  ____________________________________________   PHYSICAL EXAM:  VITAL SIGNS: ED Triage Vitals [07/31/17 1914]  Enc Vitals Group     BP 136/88     Pulse Rate 79     Resp 17     Temp 98 F (36.7 C)     Temp Source Oral     SpO2 97 %     Weight 204 lb (92.5 kg)     Height      Head Circumference      Peak Flow       Pain Score 6     Pain Loc      Pain Edu?      Excl. in [place]?      Constitutional: Alert and oriented. Well appearing and in no acute distress. Eyes: Conjunctivae are normal. PERRL. EOMI. Head: Atraumatic. Neck: No stridor.    Cardiovascular: Normal rate, regular rhythm. Normal S1 and S2.  Good peripheral circulation. Respiratory: Normal respiratory effort without tachypnea or retractions. Lungs CTAB. Good air entry to the bases with no decreased or absent breath sounds. Musculoskeletal: Full range of motion to all extremities. No gross deformities appreciated.  Patient has edema, ecchymosis noted to the medial aspect of the distal tibia.  Patient is exquisitely tender to palpation over the tibia, distal third.  Palpable abnormality in this area is associated with hematoma, as well as findings on the tibia.  Patient does have previous fracture with significant callus formation and unsure whether this is new deformity versus old.  Dorsalis pedis pulse intact.  Sensation intact.  Examination of the foot reveals mild ecchymosis to the medial foot but no significant deformity.  Full range of motion to all digits left foot.  No tenderness to palpation of the osseous structures of the left foot. Neurologic:  Normal speech and language. No gross focal neurologic deficits are appreciated.  Skin:  Skin is warm, dry and intact. No rash noted. Psychiatric: Mood and affect are normal. Speech and behavior are normal. Patient exhibits appropriate insight and judgement.   ____________________________________________   LABS (all labs ordered are listed, but only abnormal results are displayed)  Labs Reviewed - No data to display ____________________________________________  EKG   ____________________________________________  RADIOLOGY Diamantina Providence Kenady Doxtater, personally viewed and evaluated these images (plain radiographs) as part of my medical decision making, as well as reviewing the written report  by the radiologist.  Dg Tibia/fibula Left  Result Date: 07/31/2017 CLINICAL DATA:  Status post fall off porch, with bruising and swelling about the left foot and ankle. Initial encounter. EXAM: LEFT TIBIA AND FIBULA - 2 VIEW COMPARISON:  None. FINDINGS: There is no evidence of acute fracture or dislocation. There is bony remodeling about healed significantly displaced fractures of the tibia and fibula, with approximately 1 shaft width displacement at both fracture sites. Surrounding soft tissue deformity is noted. The ankle mortise appears grossly intact. No knee joint effusion is identified. Prominent ossicles are noted about the distal patellar tendon. IMPRESSION: 1. No evidence of acute fracture or dislocation. 2. Healed fractures of the tibia and fibula. Significant chronic bony deformity noted, as there was approximately 1 shaft width displacement at both fracture sites. Electronically Signed   By: Garald Balding M.D.   On: 07/31/2017 21:56   Dg Foot Complete Left  Result Date: 07/31/2017 CLINICAL DATA:  Fall from porch 3 days ago with foot pain, initial encounter EXAM: LEFT FOOT - COMPLETE 3+ VIEW COMPARISON:  None. FINDINGS: No acute fracture  or dislocation is noted. Some distal tibial changes are noted likely related to healed fracture. No soft tissue changes are seen. IMPRESSION: No acute abnormality noted. Electronically Signed   By: Inez Catalina M.D.   On: 07/31/2017 19:57    ____________________________________________    PROCEDURES  Procedure(s) performed:    Procedures    Medications  oxyCODONE-acetaminophen (PERCOCET/ROXICET) 5-325 MG per tablet 1 tablet (1 tablet Oral Given 07/31/17 2111)     ____________________________________________   INITIAL IMPRESSION / ASSESSMENT AND PLAN / ED COURSE  Pertinent labs & imaging results that were available during my care of the patient were reviewed by me and considered in my medical decision making (see chart for  details).  Review of the [place] CSRS was performed in accordance of the [place] prior to dispensing any controlled drugs.     Patient's diagnosis is consistent with injury of the left lower extremity with hematoma.  Patient presented to the emergency department after slipping and falling and injuring his left lower extremity.  Patient was concerned as he had ongoing pain with a history of significant fractures to the left lower extremity.  X-ray reveals no acute osseous abnormality to the foot or tibia and fibula.  No indication for further work-up.. Patient will be discharged home with prescriptions for limited prescription of pain medication and meloxicam for symptom improvement. Patient is to follow up with orthopedics as needed or otherwise directed. Patient is given ED precautions to return to the ED for any worsening or new symptoms.     ____________________________________________  FINAL CLINICAL IMPRESSION(S) / ED DIAGNOSES  Final diagnoses:  Injury of left lower extremity, initial encounter  Hematoma of left lower extremity, initial encounter      NEW MEDICATIONS STARTED DURING THIS VISIT:  ED Discharge Orders        Ordered    oxyCODONE-acetaminophen (PERCOCET/ROXICET) 5-325 MG tablet  Every 6 hours PRN     07/31/17 2234    meloxicam (MOBIC) 15 MG tablet  Daily     07/31/17 2234          This chart was dictated using voice recognition software/Dragon. Despite best efforts to proofread, errors can occur which can change the meaning. Any change was purely unintentional.    Darletta Moll, PA-C 08/01/17 0007    Orbie Pyo, MD 08/01/17 2226

## 2017-07-31 NOTE — ED Triage Notes (Signed)
Pt reports he fell off porch x3 days ago and since has had swelling and bruising to the left foot/ankle.

## 2017-11-07 ENCOUNTER — Other Ambulatory Visit: Payer: Self-pay | Admitting: Student

## 2017-11-07 DIAGNOSIS — M542 Cervicalgia: Secondary | ICD-10-CM

## 2017-11-09 ENCOUNTER — Ambulatory Visit
Admission: RE | Admit: 2017-11-09 | Discharge: 2017-11-09 | Disposition: A | Discharge status: Home / Self Care | Payer: Medicaid Other | Source: Ambulatory Visit | Attending: Student | Admitting: Student

## 2017-11-09 DIAGNOSIS — M542 Cervicalgia: Secondary | ICD-10-CM

## 2019-02-01 ENCOUNTER — Other Ambulatory Visit: Payer: Self-pay

## 2019-02-01 ENCOUNTER — Encounter: Payer: Self-pay | Admitting: Emergency Medicine

## 2019-02-01 ENCOUNTER — Emergency Department: Payer: Medicaid Other

## 2019-02-01 ENCOUNTER — Emergency Department
Admission: EM | Admit: 2019-02-01 | Discharge: 2019-02-01 | Disposition: A | Discharge status: Home / Self Care | Payer: Medicaid Other | Attending: Emergency Medicine | Admitting: Emergency Medicine

## 2019-02-01 DIAGNOSIS — Y92008 Other place in unspecified non-institutional (private) residence as the place of occurrence of the external cause: Secondary | ICD-10-CM | POA: Insufficient documentation

## 2019-02-01 DIAGNOSIS — Z79899 Other long term (current) drug therapy: Secondary | ICD-10-CM | POA: Diagnosis not present

## 2019-02-01 DIAGNOSIS — S2241XA Multiple fractures of ribs, right side, initial encounter for closed fracture: Secondary | ICD-10-CM

## 2019-02-01 DIAGNOSIS — Z7982 Long term (current) use of aspirin: Secondary | ICD-10-CM | POA: Diagnosis not present

## 2019-02-01 DIAGNOSIS — F17210 Nicotine dependence, cigarettes, uncomplicated: Secondary | ICD-10-CM | POA: Diagnosis not present

## 2019-02-01 DIAGNOSIS — W010XXA Fall on same level from slipping, tripping and stumbling without subsequent striking against object, initial encounter: Secondary | ICD-10-CM | POA: Diagnosis not present

## 2019-02-01 DIAGNOSIS — S299XXA Unspecified injury of thorax, initial encounter: Secondary | ICD-10-CM | POA: Diagnosis present

## 2019-02-01 DIAGNOSIS — Y999 Unspecified external cause status: Secondary | ICD-10-CM | POA: Insufficient documentation

## 2019-02-01 DIAGNOSIS — I10 Essential (primary) hypertension: Secondary | ICD-10-CM | POA: Diagnosis not present

## 2019-02-01 DIAGNOSIS — Y939 Activity, unspecified: Secondary | ICD-10-CM | POA: Diagnosis not present

## 2019-02-01 MED ORDER — LIDOCAINE 5 % EX PTCH
1.0000 | MEDICATED_PATCH | CUTANEOUS | Status: DC
  Administered 2019-02-01: 1 via TRANSDERMAL
  Filled 2019-02-01: qty 1

## 2019-02-01 MED ORDER — OXYCODONE HCL 5 MG PO CAPS: 5.0000 mg | ORAL_CAPSULE | Freq: Four times a day (QID) | ORAL | 0 refills | Status: AC | PRN

## 2019-02-01 MED ORDER — TRAMADOL HCL 50 MG PO TABS
50.0000 mg | ORAL_TABLET | Freq: Once | ORAL | Status: AC
  Administered 2019-02-01: 50 mg via ORAL
  Filled 2019-02-01: qty 1

## 2019-02-01 MED ORDER — CYCLOBENZAPRINE HCL 10 MG PO TABS
10.0000 mg | ORAL_TABLET | Freq: Once | ORAL | Status: AC
  Administered 2019-02-01: 10 mg via ORAL
  Filled 2019-02-01: qty 1

## 2019-02-01 MED ORDER — LIDOCAINE 5 % EX PTCH: 1.0000 | MEDICATED_PATCH | Freq: Two times a day (BID) | CUTANEOUS | 0 refills | Status: AC

## 2019-02-01 NOTE — ED Provider Notes (Signed)
[facility] Emergency Department Provider Note   ____________________________________________   First MD Initiated Contact with Patient 02/01/19 1411     (approximate)  I have reviewed the triage vital signs and the nursing notes.   HISTORY  Chief Complaint Rib Injury    HPI Jimmy Bishop is a 58 y.o. male patient complain right lateral rib pain secondary to a slip and fall yesterday.  Patient the pain increases with palpation or deep inspirations.  Patient rates pain as a 10/10.  Patient described the pain as "sharp".  No palliative measures for complaint.         Past Medical History:  Diagnosis Date  . Arthritis    left knee  . Hypertension     Patient Active Problem List   Diagnosis Date Noted  . S/P cervical spinal fusion 04/18/2015    Past Surgical History:  Procedure Laterality Date  . ANKLE SURGERY    . COLONOSCOPY WITH PROPOFOL N/A 07/20/2017   Procedure: COLONOSCOPY WITH PROPOFOL;  Surgeon: Toledo, Benay Pike, MD;  Location: [facility] ENDOSCOPY;  Service: Gastroenterology;  Laterality: N/A;  . POSTERIOR CERVICAL FUSION/FORAMINOTOMY N/A 04/18/2015   Procedure: Posterior cervical fusion with lateral mass fixation - Cervical three-Thoracic one, cervical laminectomy Cervical three-Cervical seven;  Surgeon: Eustace Moore, MD;  Location: [place] [facility]UR[facility] OR[facility];  Service: Neurosurgery;  Laterality: N/A;    Prior to Admission medications   Medication Sig Start Date End Date Taking? Authorizing Provider  albuterol (PROVENTIL HFA;VENTOLIN HFA) 108 (90 Base) MCG/ACT inhaler Inhale 2 puffs into the lungs every 6 (six) hours as needed for wheezing or shortness of breath. Patient not taking: Reported on 07/20/2017 05/27/17   [name], PA-C  aspirin EC 81 MG tablet Take 81 mg by mouth daily.    [provider]  lidocaine (LIDODERM) 5 % Place 1 patch onto the skin every 12 (twelve) hours. Remove & Discard patch within 12 hours or as directed by MD  02/01/19 02/01/20  Sable Feil, PA-C  meloxicam (MOBIC) 15 MG tablet Take 1 tablet (15 mg total) by mouth daily. 07/31/17   Cuthriell, Charline Bills, PA-C  oxycodone (OXY-IR) 5 MG capsule Take 1 capsule (5 mg total) by mouth every 6 (six) hours as needed for up to 5 days. 02/01/19 02/06/19  Sable Feil, PA-C  oxyCODONE-acetaminophen (PERCOCET/ROXICET) 5-325 MG tablet Take 1 tablet by mouth every 6 (six) hours as needed for severe pain. 07/31/17   Cuthriell, Charline Bills, PA-C    Allergies Vicodin [hydrocodone-acetaminophen] and Benadryl [diphenhydramine]  No family history on file.  Social History Social History   Tobacco Use  . Smoking status: Current Every Day Smoker    Packs/day: 1.00    Years: 40.00    Pack years: 40.00    Types: Cigarettes  . Smokeless tobacco: Never Used  Substance Use Topics  . Alcohol use: Yes    Comment: occ  . Drug use: No    Review of Systems  Constitutional: No fever/chills Eyes: No visual changes. ENT: No sore throat. Cardiovascular: Denies chest pain. Respiratory: Denies shortness of breath. Gastrointestinal: No abdominal pain.  No nausea, no vomiting.  No diarrhea.  No constipation. Genitourinary: Negative for dysuria. Musculoskeletal: Right lateral rib pain. Skin: Negative for rash. Neurological: Negative for headaches, focal weakness or numbness. Endocrine:  Hypertension Allergic/Immunilogical: Vicodin and Benadryl.  ____________________________________________   PHYSICAL EXAM:  VITAL SIGNS: ED Triage Vitals  Enc Vitals Group     BP 02/01/19 1403 Marland Kitchen)  156/103     Pulse Rate 02/01/19 1403 (!) 103     Resp 02/01/19 1403 20     Temp 02/01/19 1403 98.2 F (36.8 C)     Temp Source 02/01/19 1403 Oral     SpO2 02/01/19 1403 95 %     Weight 02/01/19 1401 175 lb (79.4 kg)     Height 02/01/19 1401 5\' 9"  (1.753 m)     Head Circumference --      Peak Flow --      Pain Score 02/01/19 1401 10     Pain Loc --      Pain Edu? --       Excl. in [place]? --    Constitutional: Alert and oriented. Well appearing and in no acute distress. Neck: No stridor.  No cervical spine tenderness to palpation. Hematological/Lymphatic/Immunilogical: No cervical lymphadenopathy. Cardiovascular: Normal rate, regular rhythm. Grossly normal heart sounds.  Good peripheral circulation. Respiratory: Normal respiratory effort.  No retractions. Lungs CTAB. Gastrointestinal: Soft and nontender. No distention. No abdominal bruits. No CVA tenderness. Musculoskeletal: No obvious deformity to the right lateral ribs.  Moderate guarding with palpation.  Equal chest wall expansion.. Neurologic:  Normal speech and language. No gross focal neurologic deficits are appreciated. No gait instability. Skin:  Skin is warm, dry and intact. No rash noted.  No abrasion or ecchymosis. Psychiatric: Mood and affect are normal. Speech and behavior are normal.  ____________________________________________   LABS (all labs ordered are listed, but only abnormal results are displayed)  Labs Reviewed - No data to display ____________________________________________  EKG   ____________________________________________  RADIOLOGY  ED MD interpretation:    Official radiology report(s): Dg Ribs Unilateral W/chest Right  Result Date: 02/01/2019 CLINICAL DATA:  Right lower lateral rib pain after fall. EXAM: RIGHT RIBS AND CHEST - 3+ VIEW COMPARISON:  Chest x-ray dated May 28, 2017. FINDINGS: Acute minimally displaced fractures of the right lateral eighth and ninth ribs. Old left-sided rib fractures. The heart size and mediastinal contours are within normal limits. Normal pulmonary vascularity. Minimal atelectasis at the peripheral right lung base. No focal consolidation, pleural effusion, or pneumothorax. IMPRESSION: 1. Acute minimally displaced fractures of the right lateral eighth and ninth ribs. 2.  No active cardiopulmonary disease. Electronically Signed   By: Titus Dubin M.D.   On: 02/01/2019 14:40    ____________________________________________   PROCEDURES  Procedure(s) performed (including Critical Care):  Procedures   ____________________________________________   INITIAL IMPRESSION / ASSESSMENT AND PLAN / ED COURSE  As part of my medical decision making, I reviewed the following data within the [place] was evaluated in Emergency Department on 02/01/2019 for the symptoms described in the history of present illness. He was evaluated in the context of the global COVID-19 pandemic, which necessitated consideration that the patient might be at risk for infection with the SARS-CoV-2 virus that causes COVID-19. Institutional protocols and algorithms that pertain to the evaluation of patients at risk for COVID-19 are in a state of rapid change based on information released by regulatory bodies including the CDC and federal and state organizations. These policies and algorithms were followed during the patient's care in the ED.  Patient presents with right lateral rib pain secondary to a slip and fall yesterday.  Discussed x-ray findings with patient showing nondisplaced fracture of the eighth and ninth right lateral rib.  Patient given discharge care instruction and advised take medication as  directed.  Patient advised follow-up PCP for continued care.      ____________________________________________   FINAL CLINICAL IMPRESSION(S) / ED DIAGNOSES  Final diagnoses:  Multiple fractures of ribs, right side, initial encounter for closed fracture     ED Discharge Orders         Ordered    oxycodone (OXY-IR) 5 MG capsule  Every 6 hours PRN     02/01/19 1505    lidocaine (LIDODERM) 5 %  Every 12 hours     02/01/19 1505           Note:  This document was prepared using Dragon voice recognition software and may include unintentional dictation errors.    Sable Feil, PA-C 02/01/19 1512     Blake Divine, MD 02/01/19 [phone]

## 2019-02-01 NOTE — ED Triage Notes (Signed)
Pt reports he fell on a slick spot on the porch and when he did he landed with his ribs on the side. Pt reports painful and hurts worse to breathe in.

## 2019-02-01 NOTE — Discharge Instructions (Addendum)
Follow discharge care instruction take medication as directed. °

## 2019-02-01 NOTE — ED Notes (Signed)
See triage note  Presents s/p fall  States slipped on wet porch  Having pain to right rib area

## 2019-03-29 DIAGNOSIS — Z20828 Contact with and (suspected) exposure to other viral communicable diseases: Secondary | ICD-10-CM | POA: Diagnosis not present

## 2019-08-22 DIAGNOSIS — Z716 Tobacco abuse counseling: Secondary | ICD-10-CM | POA: Diagnosis not present

## 2019-08-22 DIAGNOSIS — I10 Essential (primary) hypertension: Secondary | ICD-10-CM | POA: Diagnosis not present

## 2019-08-22 DIAGNOSIS — M542 Cervicalgia: Secondary | ICD-10-CM | POA: Diagnosis not present

## 2019-08-22 DIAGNOSIS — E7800 Pure hypercholesterolemia, unspecified: Secondary | ICD-10-CM | POA: Diagnosis not present

## 2019-08-22 DIAGNOSIS — Z1331 Encounter for screening for depression: Secondary | ICD-10-CM | POA: Diagnosis not present

## 2019-08-22 DIAGNOSIS — Z0000 Encounter for general adult medical examination without abnormal findings: Secondary | ICD-10-CM | POA: Diagnosis not present

## 2019-08-22 DIAGNOSIS — E119 Type 2 diabetes mellitus without complications: Secondary | ICD-10-CM | POA: Diagnosis not present

## 2019-09-20 DIAGNOSIS — Z419 Encounter for procedure for purposes other than remedying health state, unspecified: Secondary | ICD-10-CM | POA: Diagnosis not present

## 2019-10-21 DIAGNOSIS — Z419 Encounter for procedure for purposes other than remedying health state, unspecified: Secondary | ICD-10-CM | POA: Diagnosis not present

## 2019-12-01 ENCOUNTER — Other Ambulatory Visit: Payer: Self-pay

## 2019-12-01 ENCOUNTER — Emergency Department (HOSPITAL_COMMUNITY): Payer: Medicaid Other

## 2019-12-01 ENCOUNTER — Encounter (HOSPITAL_COMMUNITY): Payer: Self-pay | Admitting: *Deleted

## 2019-12-01 ENCOUNTER — Emergency Department (HOSPITAL_COMMUNITY)
Admission: EM | Admit: 2019-12-01 | Discharge: 2019-12-02 | Disposition: A | Discharge status: Home / Self Care | Payer: Medicaid Other | Attending: Emergency Medicine | Admitting: Emergency Medicine

## 2019-12-01 DIAGNOSIS — R079 Chest pain, unspecified: Secondary | ICD-10-CM

## 2019-12-01 DIAGNOSIS — Z7951 Long term (current) use of inhaled steroids: Secondary | ICD-10-CM | POA: Diagnosis not present

## 2019-12-01 DIAGNOSIS — R202 Paresthesia of skin: Secondary | ICD-10-CM | POA: Diagnosis not present

## 2019-12-01 DIAGNOSIS — T50904A Poisoning by unspecified drugs, medicaments and biological substances, undetermined, initial encounter: Secondary | ICD-10-CM | POA: Diagnosis not present

## 2019-12-01 DIAGNOSIS — I10 Essential (primary) hypertension: Secondary | ICD-10-CM | POA: Insufficient documentation

## 2019-12-01 DIAGNOSIS — R110 Nausea: Secondary | ICD-10-CM | POA: Diagnosis not present

## 2019-12-01 DIAGNOSIS — R0902 Hypoxemia: Secondary | ICD-10-CM | POA: Diagnosis not present

## 2019-12-01 DIAGNOSIS — F17210 Nicotine dependence, cigarettes, uncomplicated: Secondary | ICD-10-CM | POA: Diagnosis not present

## 2019-12-01 DIAGNOSIS — R404 Transient alteration of awareness: Secondary | ICD-10-CM | POA: Diagnosis not present

## 2019-12-01 DIAGNOSIS — Z720 Tobacco use: Secondary | ICD-10-CM

## 2019-12-01 DIAGNOSIS — I6782 Cerebral ischemia: Secondary | ICD-10-CM | POA: Diagnosis not present

## 2019-12-01 DIAGNOSIS — R200 Anesthesia of skin: Secondary | ICD-10-CM | POA: Diagnosis not present

## 2019-12-01 DIAGNOSIS — R4781 Slurred speech: Secondary | ICD-10-CM | POA: Diagnosis not present

## 2019-12-01 DIAGNOSIS — R0789 Other chest pain: Secondary | ICD-10-CM | POA: Insufficient documentation

## 2019-12-01 DIAGNOSIS — G319 Degenerative disease of nervous system, unspecified: Secondary | ICD-10-CM | POA: Diagnosis not present

## 2019-12-01 LAB — TROPONIN I (HIGH SENSITIVITY)
Troponin I (High Sensitivity): 7 ng/L (ref <18)
Troponin I (High Sensitivity): 7 ng/L (ref <18)

## 2019-12-01 LAB — CBC
HCT: 52.7 % — ABNORMAL HIGH (ref 39.0–52.0)
Hemoglobin: 17.2 g/dL — ABNORMAL HIGH (ref 13.0–17.0)
MCH: 33 pg (ref 26.0–34.0)
MCHC: 32.6 g/dL (ref 30.0–36.0)
MCV: 101 fL — ABNORMAL HIGH (ref 80.0–100.0)
Platelets: 222 10*3/uL (ref 150–400)
RBC: 5.22 MIL/uL (ref 4.22–5.81)
RDW: 14.1 % (ref 11.5–15.5)
WBC: 5.9 10*3/uL (ref 4.0–10.5)
nRBC: 0 % (ref 0.0–0.2)

## 2019-12-01 LAB — BASIC METABOLIC PANEL
Anion gap: 13 (ref 5–15)
BUN: 11 mg/dL (ref 6–20)
CO2: 25 mmol/L (ref 22–32)
Calcium: 8.8 mg/dL — ABNORMAL LOW (ref 8.9–10.3)
Chloride: 109 mmol/L (ref 98–111)
Creatinine, Ser: 1.02 mg/dL (ref 0.61–1.24)
GFR calc Af Amer: >60 mL/min (ref >60)
GFR calc non Af Amer: >60 mL/min (ref >60)
Glucose, Bld: 100 mg/dL — ABNORMAL HIGH (ref 70–99)
Potassium: 4.1 mmol/L (ref 3.5–5.1)
Sodium: 147 mmol/L — ABNORMAL HIGH (ref 135–145)

## 2019-12-01 NOTE — ED Notes (Signed)
Please call Belenda Cruise [phone] with updates

## 2019-12-01 NOTE — ED Triage Notes (Signed)
Has had the first pfizer injectionm

## 2019-12-01 NOTE — ED Triage Notes (Signed)
The pt arrived by [place] chest pain then he began to c/o numbness and tingling both arms  And he reports that he cannot feel his lt leg  He repoerts that he is afraid of having  A stroke  He has had 2 beers today.      C/o nausea and blurred vision  He cannot tell me when all these symptoms started  Pt discussed with dr Eulis Foster

## 2019-12-01 NOTE — ED Notes (Signed)
Pt discussed with dr Archer Asa he has suggested a c-t of the brain

## 2019-12-01 NOTE — ED Notes (Signed)
The pt reports that his lt leg has no feeling in it color good pulse present

## 2019-12-02 MED ORDER — LISINOPRIL 10 MG PO TABS
10.0000 mg | ORAL_TABLET | Freq: Once | ORAL | Status: AC
  Administered 2019-12-02: 10 mg via ORAL
  Filled 2019-12-02: qty 1

## 2019-12-02 NOTE — ED Notes (Signed)
Notified EDP of ongoing chest tightness.

## 2019-12-02 NOTE — Discharge Instructions (Addendum)
The testing does not show any problems with your heart or lungs today.  It is important to stop smoking, to improve your chances of recovery.  This will help your cough and will probably help your chest pain.  Make sure you are getting plenty of rest, taking your medications as directed, and follow-up with your PCP for a checkup in a week or so.

## 2019-12-02 NOTE — ED Provider Notes (Signed)
[facility] EMERGENCY DEPARTMENT Provider Note   CSN: [id number] Arrival date & time: 12/01/19  1637     History Chief Complaint  Patient presents with  . Chest Pain    Jimmy Bishop is a 59 y.o. male.  HPI Patient seen by me at 11:05 AM following a prolonged wait, for examination room.  Since arrival blood pressure has increased.  Patient presented for evaluation yesterday, by EMS, for evaluation of chest pain with numbness and tingling both arms.  He has decreased sensation in his left leg.  He told EMS that he was worried that he was having a stroke.  Patient took his lisinopril yesterday morning, but did not take it since arrival here.  He states his numbness sensation in the hands and both of his legs, is intermittent in character.  He does not have any trouble walking or using his hands.  His chest pain is described as a tight feeling that is worse when taking a deep breath.  He has a chronic cough producing yellow sputum.  He is a smoker.  He denies fever, chills, nausea, vomiting, weakness or dizziness.  He does not have a prior cardiac history.  There are no other known modifying factors.    Past Medical History:  Diagnosis Date  . Arthritis    left knee  . Hypertension     Patient Active Problem List   Diagnosis Date Noted  . S/P cervical spinal fusion 04/18/2015    Past Surgical History:  Procedure Laterality Date  . ANKLE SURGERY    . COLONOSCOPY WITH PROPOFOL N/A 07/20/2017   Procedure: COLONOSCOPY WITH PROPOFOL;  Surgeon: Toledo, Benay Pike, MD;  Location: [facility] ENDOSCOPY;  Service: Gastroenterology;  Laterality: N/A;  . POSTERIOR CERVICAL FUSION/FORAMINOTOMY N/A 04/18/2015   Procedure: Posterior cervical fusion with lateral mass fixation - Cervical three-Thoracic one, cervical laminectomy Cervical three-Cervical seven;  Surgeon: Eustace Moore, MD;  Location: [facility];  Service: Neurosurgery;  Laterality: N/A;       No family history on  file.  Social History   Tobacco Use  . Smoking status: Current Every Day Smoker    Packs/day: 1.00    Years: 40.00    Pack years: 40.00    Types: Cigarettes  . Smokeless tobacco: Never Used  Substance Use Topics  . Alcohol use: Yes    Comment: occ  . Drug use: No    Home Medications Prior to Admission medications   Medication Sig Start Date End Date Taking? Authorizing Provider  albuterol (PROVENTIL HFA;VENTOLIN HFA) 108 (90 Base) MCG/ACT inhaler Inhale 2 puffs into the lungs every 6 (six) hours as needed for wheezing or shortness of breath. Patient not taking: Reported on 07/20/2017 05/27/17   [name], PA-C  aspirin EC 81 MG tablet Take 81 mg by mouth daily.    [provider]  lidocaine (LIDODERM) 5 % Place 1 patch onto the skin every 12 (twelve) hours. Remove & Discard patch within 12 hours or as directed by MD 02/01/19 02/01/20  Sable Feil, PA-C  meloxicam (MOBIC) 15 MG tablet Take 1 tablet (15 mg total) by mouth daily. 07/31/17   Cuthriell, Charline Bills, PA-C  oxyCODONE-acetaminophen (PERCOCET/ROXICET) 5-325 MG tablet Take 1 tablet by mouth every 6 (six) hours as needed for severe pain. 07/31/17   Cuthriell, Charline Bills, PA-C    Allergies    Vicodin [hydrocodone-acetaminophen] and Benadryl [diphenhydramine]  Review of Systems   Review of Systems  All  other systems reviewed and are negative.   Physical Exam Updated Vital Signs BP (!) 188/90 (BP Location: Right Arm)   Pulse 80   Temp 97.7 F (36.5 C) (Oral)   Resp 12   Ht 5\' 9"  (1.753 m)   Wt 86.2 kg   SpO2 96%   BMI 28.06 kg/m   Physical Exam Vitals and nursing note reviewed.  Constitutional:      General: He is not in acute distress.    Appearance: He is well-developed. He is not ill-appearing, toxic-appearing or diaphoretic.  HENT:     Head: Normocephalic and atraumatic.     Right Ear: External ear normal.     Left Ear: External ear normal.  Eyes:     Conjunctiva/sclera: Conjunctivae  normal.     Pupils: Pupils are equal, round, and reactive to light.  Neck:     Trachea: Phonation normal.  Cardiovascular:     Rate and Rhythm: Normal rate and regular rhythm.     Heart sounds: Normal heart sounds.  Pulmonary:     Effort: Pulmonary effort is normal. No respiratory distress.     Breath sounds: Normal breath sounds. No stridor. No rhonchi.  Chest:     Chest wall: No tenderness.  Abdominal:     General: There is no distension.     Palpations: Abdomen is soft.     Tenderness: There is no abdominal tenderness.  Musculoskeletal:        General: Normal range of motion.     Cervical back: Normal range of motion and neck supple.  Skin:    General: Skin is warm and dry.  Neurological:     Mental Status: He is alert and oriented to person, place, and time.     Cranial Nerves: No cranial nerve deficit.     Sensory: No sensory deficit.     Motor: No abnormal muscle tone.     Coordination: Coordination normal.  Psychiatric:        Mood and Affect: Mood normal.        Behavior: Behavior normal.        Thought Content: Thought content normal.        Judgment: Judgment normal.     ED Results / Procedures / Treatments   Labs (all labs ordered are listed, but only abnormal results are displayed) Labs Reviewed  BASIC METABOLIC PANEL - Abnormal; Notable for the following components:      Result Value   Sodium 147 (*)    Glucose, Bld 100 (*)    Calcium 8.8 (*)    All other components within normal limits  CBC - Abnormal; Notable for the following components:   Hemoglobin 17.2 (*)    HCT 52.7 (*)    MCV 101.0 (*)    All other components within normal limits  TROPONIN I (HIGH SENSITIVITY)  TROPONIN I (HIGH SENSITIVITY)    EKG None  Radiology DG Chest 2 View  Result Date: 12/01/2019 CLINICAL DATA:  Chest pain, numbness in both arms and shortness of breath EXAM: CHEST - 2 VIEW COMPARISON:  02/01/2019 FINDINGS: Trachea is midline. Cardiomediastinal contours and hilar  structures are stable. Blunting of the RIGHT costodiaphragmatic sulcus is noted with obscured RIGHT hemidiaphragm. RIGHT-sided rib fractures were present previously and are again noted with some callus formation. Signs of LEFT-sided rib fractures with chronic appearance also noted on prior exam. No effusion. Lower thoracic compression fractures as before. Incidental note is again made of cervical spine fusion. IMPRESSION:  New blunting of RIGHT costodiaphragmatic sulcus may represent developing infection, no effusion seen on lateral view. Pleural and parenchymal scarring after previous rib fracture is also considered. Correlate with any pain in this area or pleuritic/respiratory type symptoms. Electronically Signed   By: Zetta Bills M.D.   On: 12/01/2019 19:05   CT Head Wo Contrast  Result Date: 12/01/2019 CLINICAL DATA:  Chest pain, numbness and tingling in both arms, cannot feel leg, question stroke, history hypertension, smoker EXAM: CT HEAD WITHOUT CONTRAST TECHNIQUE: Contiguous axial images were obtained from the base of the skull through the vertex without intravenous contrast. COMPARISON:  None FINDINGS: Brain: Generalized atrophy. Normal ventricular morphology. No midline shift or mass effect. Small vessel chronic ischemic changes of deep cerebral white matter. No intracranial hemorrhage, mass lesion, evidence of acute infarction, or definite extra-axial fluid collection. Few scattered motion artifacts. Tiny focus of high attenuation is seen adjacent to the LEFT frontal lobe image 12, felt to represent an artifact. Vascular: Extensive atherosclerotic calcification of internal carotid arteries at skull base Skull: Intact Sinuses/Orbits: Mild mucosal thickening in scattered ethmoid air cells Other: N/A IMPRESSION: Atrophy with small vessel chronic ischemic changes of deep cerebral white matter. No acute intracranial abnormalities. Electronically Signed   By: Lavonia Dana M.D.   On: 12/01/2019 18:40     Procedures Procedures (including critical care time)  Medications Ordered in ED Medications - No data to display  ED Course  I have reviewed the triage vital signs and the nursing notes.  Pertinent labs & imaging results that were available during my care of the patient were reviewed by me and considered in my medical decision making (see chart for details).  Clinical Course as of Dec 01 1124  Nancy Fetter Dec 02, 2019  1107 Negative delta troponin  Troponin I (High Sensitivity) [EW]  1107 Normal except white count elevated  CBC(!) [EW]  1107 Normal except sodium high, glucose high, calcium low  Basic metabolic panel(!) [EW]  5003 Per radiologist nonspecific blunting of the right costal diaphragmatic region.  No clear evidence for infiltrate.  Healed/chronic rib fractures are present  DG Chest 2 View [EW]  7048 Per radiologist, no acute intracranial abnormalities.  CT Head Wo Contrast [EW]  1125 RDW: 14.1 [EW]    Clinical Course User Index [EW] Daleen Bo, MD   MDM Rules/Calculators/A&P                           Patient Vitals for the past 24 hrs:  BP Temp Temp src Pulse Resp SpO2 Height Weight  12/02/19 0955 (!) 188/90 97.7 F (36.5 C) Oral 80 12 96 % -- --  12/02/19 0742 (!) 158/80 -- -- 88 18 99 % -- --  12/02/19 0419 (!) 147/89 97.7 F (36.5 C) Oral 86 12 98 % -- --  12/01/19 2221 123/82 98.3 F (36.8 C) Oral 88 18 94 % -- --  12/01/19 1748 -- -- -- -- -- -- 5\' 9"  (1.753 m) 86.2 kg  12/01/19 1741 120/67 98.4 F (36.9 C) Oral 86 20 96 % -- --    11:24 AM Reevaluation with update and discussion. After initial assessment and treatment, an updated evaluation reveals no change in clinical status, findings discussed with the patient and all questions were answered. Daleen Bo   Medical Decision Making:  This patient is presenting for evaluation of chest pain and paresthesias, which does require a range of treatment options, and is a complaint  that involves a  moderate risk of morbidity and mortality. The differential diagnoses include nonspecific myalgias, cardiac disorder, pulmonary disorder, CNS or spine disorder. I decided to review old records, and in summary chronic smoker presenting with tight feeling in the chest, and varying paresthesias.  He had a prolonged wait before being seen.  I did not require additional historical information from anyone.  Clinical Laboratory Tests Ordered, included CBC, Urinalysis and Delta troponin. Review indicates mild elevation of hemoglobin, and sodium.. Radiologic Tests Ordered, included chest x-ray, CT head.  I independently Visualized: Radiographic images, which show no definite acute abnormalities    Critical Interventions-clinical evaluation, laboratory testing, radiographs, observation reassessment  After These Interventions, the Patient was reevaluated and was found with mild blood pressure secondary to not taking morning lisinopril dose.  He had a prolonged wait before being seen.  Delta troponin negative.  EKG is nonischemic.  Patient's pain is noncardiac.  He has a chronic cough and does not have signs or symptoms of acute pneumonia or infiltrate associated with the abnormal appearance of the chest x-ray.  I suspect that this was related to prior trauma, versus a nonspecific, not acute problem.  No indication for further ED evaluation or hospitalization.  He is stable for discharge.  CRITICAL CARE-no Performed by: Daleen Bo  Nursing Notes Reviewed/ Care Coordinated Applicable Imaging Reviewed Interpretation of Laboratory Data incorporated into ED treatment  The patient appears reasonably screened and/or stabilized for discharge and I doubt any other medical condition or other [facility] requiring further screening, evaluation, or treatment in the ED at this time prior to discharge.  Plan: Home Medications-continue usual medications, use Tylenol for pain; Home Treatments-stop smoking; return here if the  recommended treatment, does not improve the symptoms; Recommended follow up-PCP checkup 1 week and as needed     Final Clinical Impression(s) / ED Diagnoses Final diagnoses:  None    Rx / DC Orders ED Discharge Orders    None       Daleen Bo, MD 12/02/19 1126

## 2019-12-02 NOTE — ED Notes (Signed)
Assisted pt with reaching someone to pick him up from the ED. Patient verbalizes understanding of discharge instructions. Opportunity for questioning and answers were provided. Armband removed by staff, pt discharged from ED. Pt given blue scrub top and encouraged to wear hospital slippers home.

## 2019-12-02 NOTE — ED Notes (Signed)
A bag lunch and water provided.

## 2020-02-27 DIAGNOSIS — E118 Type 2 diabetes mellitus with unspecified complications: Secondary | ICD-10-CM | POA: Diagnosis not present

## 2020-02-27 DIAGNOSIS — E782 Mixed hyperlipidemia: Secondary | ICD-10-CM | POA: Diagnosis not present

## 2020-02-27 DIAGNOSIS — J449 Chronic obstructive pulmonary disease, unspecified: Secondary | ICD-10-CM | POA: Diagnosis not present

## 2020-02-27 DIAGNOSIS — F17210 Nicotine dependence, cigarettes, uncomplicated: Secondary | ICD-10-CM | POA: Diagnosis not present

## 2020-02-27 DIAGNOSIS — I10 Essential (primary) hypertension: Secondary | ICD-10-CM | POA: Diagnosis not present

## 2020-05-27 DIAGNOSIS — Z125 Encounter for screening for malignant neoplasm of prostate: Secondary | ICD-10-CM | POA: Diagnosis not present

## 2020-05-27 DIAGNOSIS — E559 Vitamin D deficiency, unspecified: Secondary | ICD-10-CM | POA: Diagnosis not present

## 2020-05-27 DIAGNOSIS — E1169 Type 2 diabetes mellitus with other specified complication: Secondary | ICD-10-CM | POA: Diagnosis not present

## 2020-05-27 DIAGNOSIS — I10 Essential (primary) hypertension: Secondary | ICD-10-CM | POA: Diagnosis not present

## 2020-06-20 DIAGNOSIS — Z419 Encounter for procedure for purposes other than remedying health state, unspecified: Secondary | ICD-10-CM | POA: Diagnosis not present

## 2020-07-20 DIAGNOSIS — Z419 Encounter for procedure for purposes other than remedying health state, unspecified: Secondary | ICD-10-CM | POA: Diagnosis not present

## 2020-08-20 DIAGNOSIS — Z419 Encounter for procedure for purposes other than remedying health state, unspecified: Secondary | ICD-10-CM | POA: Diagnosis not present

## 2020-08-27 DIAGNOSIS — E1169 Type 2 diabetes mellitus with other specified complication: Secondary | ICD-10-CM | POA: Diagnosis not present

## 2020-08-27 DIAGNOSIS — I10 Essential (primary) hypertension: Secondary | ICD-10-CM | POA: Diagnosis not present

## 2020-08-27 DIAGNOSIS — E782 Mixed hyperlipidemia: Secondary | ICD-10-CM | POA: Diagnosis not present

## 2020-09-09 ENCOUNTER — Inpatient Hospital Stay: Payer: Medicaid Other | Admitting: Anesthesiology

## 2020-09-09 ENCOUNTER — Emergency Department: Payer: Medicaid Other

## 2020-09-09 ENCOUNTER — Encounter: Payer: Self-pay | Admitting: Internal Medicine

## 2020-09-09 ENCOUNTER — Other Ambulatory Visit: Payer: Self-pay

## 2020-09-09 ENCOUNTER — Encounter
Admission: EM | Disposition: A | Discharge status: Home / Self Care | Payer: Self-pay | Source: Home / Self Care | Attending: Internal Medicine

## 2020-09-09 ENCOUNTER — Inpatient Hospital Stay
Admission: EM | Admit: 2020-09-09 | Discharge: 2020-09-14 | DRG: 853 | Disposition: A | Discharge status: Home / Self Care | Payer: Medicaid Other | Attending: Internal Medicine | Admitting: Internal Medicine

## 2020-09-09 DIAGNOSIS — Z7982 Long term (current) use of aspirin: Secondary | ICD-10-CM | POA: Diagnosis not present

## 2020-09-09 DIAGNOSIS — Z7984 Long term (current) use of oral hypoglycemic drugs: Secondary | ICD-10-CM | POA: Diagnosis not present

## 2020-09-09 DIAGNOSIS — W540XXA Bitten by dog, initial encounter: Secondary | ICD-10-CM | POA: Diagnosis present

## 2020-09-09 DIAGNOSIS — Z79899 Other long term (current) drug therapy: Secondary | ICD-10-CM

## 2020-09-09 DIAGNOSIS — Z8249 Family history of ischemic heart disease and other diseases of the circulatory system: Secondary | ICD-10-CM | POA: Diagnosis not present

## 2020-09-09 DIAGNOSIS — Z801 Family history of malignant neoplasm of trachea, bronchus and lung: Secondary | ICD-10-CM | POA: Diagnosis not present

## 2020-09-09 DIAGNOSIS — F17210 Nicotine dependence, cigarettes, uncomplicated: Secondary | ICD-10-CM | POA: Diagnosis present

## 2020-09-09 DIAGNOSIS — E785 Hyperlipidemia, unspecified: Secondary | ICD-10-CM | POA: Diagnosis present

## 2020-09-09 DIAGNOSIS — Z203 Contact with and (suspected) exposure to rabies: Secondary | ICD-10-CM | POA: Diagnosis present

## 2020-09-09 DIAGNOSIS — Z981 Arthrodesis status: Secondary | ICD-10-CM

## 2020-09-09 DIAGNOSIS — L03113 Cellulitis of right upper limb: Secondary | ICD-10-CM

## 2020-09-09 DIAGNOSIS — E119 Type 2 diabetes mellitus without complications: Secondary | ICD-10-CM | POA: Diagnosis present

## 2020-09-09 DIAGNOSIS — M726 Necrotizing fasciitis: Secondary | ICD-10-CM | POA: Diagnosis present

## 2020-09-09 DIAGNOSIS — J449 Chronic obstructive pulmonary disease, unspecified: Secondary | ICD-10-CM | POA: Diagnosis present

## 2020-09-09 DIAGNOSIS — S21159A Open bite of unspecified front wall of thorax without penetration into thoracic cavity, initial encounter: Secondary | ICD-10-CM | POA: Diagnosis not present

## 2020-09-09 DIAGNOSIS — I10 Essential (primary) hypertension: Secondary | ICD-10-CM | POA: Diagnosis present

## 2020-09-09 DIAGNOSIS — Z23 Encounter for immunization: Secondary | ICD-10-CM

## 2020-09-09 DIAGNOSIS — S51851A Open bite of right forearm, initial encounter: Secondary | ICD-10-CM | POA: Diagnosis not present

## 2020-09-09 DIAGNOSIS — L02413 Cutaneous abscess of right upper limb: Secondary | ICD-10-CM | POA: Diagnosis present

## 2020-09-09 DIAGNOSIS — M1990 Unspecified osteoarthritis, unspecified site: Secondary | ICD-10-CM | POA: Diagnosis not present

## 2020-09-09 DIAGNOSIS — H9190 Unspecified hearing loss, unspecified ear: Secondary | ICD-10-CM | POA: Diagnosis present

## 2020-09-09 DIAGNOSIS — B954 Other streptococcus as the cause of diseases classified elsewhere: Secondary | ICD-10-CM | POA: Diagnosis not present

## 2020-09-09 DIAGNOSIS — E1159 Type 2 diabetes mellitus with other circulatory complications: Secondary | ICD-10-CM | POA: Diagnosis present

## 2020-09-09 DIAGNOSIS — Z20822 Contact with and (suspected) exposure to covid-19: Secondary | ICD-10-CM | POA: Diagnosis not present

## 2020-09-09 DIAGNOSIS — R6520 Severe sepsis without septic shock: Secondary | ICD-10-CM | POA: Diagnosis not present

## 2020-09-09 DIAGNOSIS — A419 Sepsis, unspecified organism: Principal | ICD-10-CM | POA: Diagnosis present

## 2020-09-09 DIAGNOSIS — E1169 Type 2 diabetes mellitus with other specified complication: Secondary | ICD-10-CM

## 2020-09-09 DIAGNOSIS — Z720 Tobacco use: Secondary | ICD-10-CM | POA: Diagnosis not present

## 2020-09-09 DIAGNOSIS — Z2914 Encounter for prophylactic rabies immune globin: Secondary | ICD-10-CM | POA: Diagnosis not present

## 2020-09-09 DIAGNOSIS — M79631 Pain in right forearm: Secondary | ICD-10-CM | POA: Diagnosis not present

## 2020-09-09 HISTORY — DX: Chronic obstructive pulmonary disease, unspecified: J44.9

## 2020-09-09 HISTORY — PX: IRRIGATION AND DEBRIDEMENT ABSCESS: SHX5252

## 2020-09-09 HISTORY — DX: Type 2 diabetes mellitus without complications: E11.9

## 2020-09-09 HISTORY — DX: Cellulitis of right upper limb: L03.113

## 2020-09-09 HISTORY — DX: Bitten by dog, initial encounter: W54.0XXA

## 2020-09-09 LAB — LACTIC ACID, PLASMA
Lactic Acid, Venous: 2.4 mmol/L (ref 0.5–1.9)
Lactic Acid, Venous: 1.7 mmol/L (ref 0.5–1.9)

## 2020-09-09 LAB — CBC WITH DIFFERENTIAL/PLATELET
Abs Immature Granulocytes: 0.12 10*3/uL — ABNORMAL HIGH (ref 0.00–0.07)
Basophils Absolute: 0.1 10*3/uL (ref 0.0–0.1)
Basophils Relative: 0 %
Eosinophils Absolute: 0 10*3/uL (ref 0.0–0.5)
Eosinophils Relative: 0 %
HCT: 43.5 % (ref 39.0–52.0)
Hemoglobin: 14.9 g/dL (ref 13.0–17.0)
Immature Granulocytes: 1 %
Lymphocytes Relative: 10 %
Lymphs Abs: 1.4 10*3/uL (ref 0.7–4.0)
MCH: 33.3 pg (ref 26.0–34.0)
MCHC: 34.3 g/dL (ref 30.0–36.0)
MCV: 97.1 fL (ref 80.0–100.0)
Monocytes Absolute: 1.3 10*3/uL — ABNORMAL HIGH (ref 0.1–1.0)
Monocytes Relative: 10 %
Neutro Abs: 10.4 10*3/uL — ABNORMAL HIGH (ref 1.7–7.7)
Neutrophils Relative %: 79 %
Platelets: 311 10*3/uL (ref 150–400)
RBC: 4.48 MIL/uL (ref 4.22–5.81)
RDW: 12.6 % (ref 11.5–15.5)
WBC: 13.3 10*3/uL — ABNORMAL HIGH (ref 4.0–10.5)
nRBC: 0 % (ref 0.0–0.2)

## 2020-09-09 LAB — COMPREHENSIVE METABOLIC PANEL
ALT: 16 U/L (ref 0–44)
AST: 17 U/L (ref 15–41)
Albumin: 3.7 g/dL (ref 3.5–5.0)
Alkaline Phosphatase: 56 U/L (ref 38–126)
Anion gap: 11 (ref 5–15)
BUN: 19 mg/dL (ref 6–20)
CO2: 19 mmol/L — ABNORMAL LOW (ref 22–32)
Calcium: 9.1 mg/dL (ref 8.9–10.3)
Chloride: 101 mmol/L (ref 98–111)
Creatinine, Ser: 1.19 mg/dL (ref 0.61–1.24)
GFR, Estimated: >60 mL/min (ref >60)
Glucose, Bld: 225 mg/dL — ABNORMAL HIGH (ref 70–99)
Potassium: 3.7 mmol/L (ref 3.5–5.1)
Sodium: 131 mmol/L — ABNORMAL LOW (ref 135–145)
Total Bilirubin: 1.2 mg/dL (ref 0.3–1.2)
Total Protein: 7.6 g/dL (ref 6.5–8.1)

## 2020-09-09 LAB — PROTIME-INR
INR: 1.2 (ref 0.8–1.2)
Prothrombin Time: 15 seconds (ref 11.4–15.2)

## 2020-09-09 LAB — CBG MONITORING, ED
Glucose-Capillary: 99 mg/dL (ref 70–99)
Glucose-Capillary: 88 mg/dL (ref 70–99)

## 2020-09-09 LAB — RESP PANEL BY RT-PCR (FLU A&B, COVID) ARPGX2
Influenza A by PCR: NEGATIVE
Influenza B by PCR: NEGATIVE
SARS Coronavirus 2 by RT PCR: NEGATIVE

## 2020-09-09 LAB — C-REACTIVE PROTEIN: CRP: 11.5 mg/dL — ABNORMAL HIGH (ref <1.0)

## 2020-09-09 LAB — APTT: aPTT: 44 seconds — ABNORMAL HIGH (ref 24–36)

## 2020-09-09 LAB — GLUCOSE, CAPILLARY: Glucose-Capillary: 112 mg/dL — ABNORMAL HIGH (ref 70–99)

## 2020-09-09 LAB — BRAIN NATRIURETIC PEPTIDE: B Natriuretic Peptide: 30.8 pg/mL (ref 0.0–100.0)

## 2020-09-09 LAB — SEDIMENTATION RATE: Sed Rate: 24 mm/hr — ABNORMAL HIGH (ref 0–20)

## 2020-09-09 SURGERY — IRRIGATION AND DEBRIDEMENT ABSCESS
Anesthesia: General | Laterality: Right

## 2020-09-09 MED ORDER — PHENYLEPHRINE HCL (PRESSORS) 10 MG/ML IV SOLN
INTRAVENOUS | Status: DC | PRN
  Administered 2020-09-09 (×5): 100 ug via INTRAVENOUS

## 2020-09-09 MED ORDER — LACTATED RINGERS IV SOLN: INTRAVENOUS | Status: DC

## 2020-09-09 MED ORDER — LACTATED RINGERS IV BOLUS
1000.0000 mL | Freq: Once | INTRAVENOUS | Status: AC
  Administered 2020-09-09: 1000 mL via INTRAVENOUS

## 2020-09-09 MED ORDER — MORPHINE SULFATE (PF) 2 MG/ML IV SOLN
2.0000 mg | INTRAVENOUS | Status: DC | PRN
Start: 2020-09-09 — End: 2020-09-09
  Administered 2020-09-09: 2 mg via INTRAVENOUS
  Filled 2020-09-09: qty 1

## 2020-09-09 MED ORDER — OXYCODONE-ACETAMINOPHEN 5-325 MG PO TABS
1.0000 | ORAL_TABLET | ORAL | Status: DC | PRN
  Administered 2020-09-10 – 2020-09-14 (×17): 1 via ORAL
  Filled 2020-09-09 (×17): qty 1

## 2020-09-09 MED ORDER — MIDAZOLAM HCL 2 MG/2ML IJ SOLN
INTRAMUSCULAR | Status: AC
  Filled 2020-09-09: qty 2

## 2020-09-09 MED ORDER — TETANUS-DIPHTH-ACELL PERTUSSIS 5-2.5-18.5 LF-MCG/0.5 IM SUSY
0.5000 mL | PREFILLED_SYRINGE | Freq: Once | INTRAMUSCULAR | Status: AC
  Administered 2020-09-09: 0.5 mL via INTRAMUSCULAR
  Filled 2020-09-09: qty 0.5

## 2020-09-09 MED ORDER — ALBUTEROL SULFATE HFA 108 (90 BASE) MCG/ACT IN AERS
INHALATION_SPRAY | RESPIRATORY_TRACT | Status: DC | PRN
  Administered 2020-09-09: 4 via RESPIRATORY_TRACT

## 2020-09-09 MED ORDER — ACETAMINOPHEN 325 MG PO TABS: 650.0000 mg | ORAL_TABLET | Freq: Four times a day (QID) | ORAL | Status: DC | PRN

## 2020-09-09 MED ORDER — INSULIN ASPART 100 UNIT/ML IJ SOLN: 0.0000 [IU] | Freq: Every day | INTRAMUSCULAR | Status: DC

## 2020-09-09 MED ORDER — SODIUM CHLORIDE 0.9 % IV SOLN
2.0000 g | Freq: Once | INTRAVENOUS | Status: AC
  Administered 2020-09-09: 2 g via INTRAVENOUS
  Filled 2020-09-09: qty 20

## 2020-09-09 MED ORDER — ARFORMOTEROL TARTRATE 15 MCG/2ML IN NEBU
15.0000 ug | INHALATION_SOLUTION | Freq: Two times a day (BID) | RESPIRATORY_TRACT | Status: DC
  Administered 2020-09-09 – 2020-09-14 (×10): 15 ug via RESPIRATORY_TRACT
  Filled 2020-09-09 (×11): qty 2

## 2020-09-09 MED ORDER — GLYCOPYRROLATE 0.2 MG/ML IJ SOLN
INTRAMUSCULAR | Status: DC | PRN
  Administered 2020-09-09: .2 mg via INTRAVENOUS

## 2020-09-09 MED ORDER — ONDANSETRON HCL 4 MG/2ML IJ SOLN: 4.0000 mg | Freq: Three times a day (TID) | INTRAMUSCULAR | Status: DC | PRN

## 2020-09-09 MED ORDER — FLUTICASONE PROPIONATE HFA 220 MCG/ACT IN AERO: 2.0000 | INHALATION_SPRAY | Freq: Two times a day (BID) | RESPIRATORY_TRACT | Status: DC

## 2020-09-09 MED ORDER — FENTANYL CITRATE (PF) 100 MCG/2ML IJ SOLN
INTRAMUSCULAR | Status: AC
  Filled 2020-09-09: qty 2

## 2020-09-09 MED ORDER — VANCOMYCIN HCL 750 MG/150ML IV SOLN
750.0000 mg | Freq: Once | INTRAVENOUS | Status: AC
  Administered 2020-09-09: 750 mg via INTRAVENOUS
  Filled 2020-09-09: qty 150

## 2020-09-09 MED ORDER — FENTANYL CITRATE (PF) 100 MCG/2ML IJ SOLN
25.0000 ug | INTRAMUSCULAR | Status: DC | PRN
  Administered 2020-09-09 (×3): 25 ug via INTRAVENOUS

## 2020-09-09 MED ORDER — MORPHINE SULFATE (PF) 4 MG/ML IV SOLN
4.0000 mg | INTRAVENOUS | Status: DC | PRN
  Administered 2020-09-09 – 2020-09-10 (×3): 4 mg via INTRAVENOUS
  Filled 2020-09-09 (×3): qty 1

## 2020-09-09 MED ORDER — SODIUM CHLORIDE 0.9 % IV SOLN
INTRAVENOUS | Status: DC | PRN
  Administered 2020-09-09: 30 ug/min via INTRAVENOUS

## 2020-09-09 MED ORDER — ALBUTEROL SULFATE (2.5 MG/3ML) 0.083% IN NEBU: 3.0000 mL | INHALATION_SOLUTION | RESPIRATORY_TRACT | Status: DC | PRN

## 2020-09-09 MED ORDER — FERROUS SULFATE 325 (65 FE) MG PO TABS
325.0000 mg | ORAL_TABLET | Freq: Every day | ORAL | Status: DC
  Administered 2020-09-10 – 2020-09-14 (×5): 325 mg via ORAL
  Filled 2020-09-09 (×5): qty 1

## 2020-09-09 MED ORDER — CHLORHEXIDINE GLUCONATE 0.12 % MT SOLN
15.0000 mL | Freq: Once | OROMUCOSAL | Status: AC
  Administered 2020-09-09: 15 mL via OROMUCOSAL

## 2020-09-09 MED ORDER — RABIES IMMUNE GLOBULIN 150 UNIT/ML IM INJ
20.0000 [IU]/kg | INJECTION | Freq: Once | INTRAMUSCULAR | Status: AC
  Administered 2020-09-09: 1650 [IU] via INTRAMUSCULAR
  Filled 2020-09-09: qty 11

## 2020-09-09 MED ORDER — NICOTINE 21 MG/24HR TD PT24
21.0000 mg | MEDICATED_PATCH | Freq: Every day | TRANSDERMAL | Status: DC
  Filled 2020-09-09 (×3): qty 1

## 2020-09-09 MED ORDER — DEXAMETHASONE SODIUM PHOSPHATE 10 MG/ML IJ SOLN
INTRAMUSCULAR | Status: DC | PRN
  Administered 2020-09-09: 10 mg via INTRAVENOUS

## 2020-09-09 MED ORDER — DAKINS (1/4 STRENGTH) 0.125 % EX SOLN
CUTANEOUS | Status: DC | PRN
  Administered 2020-09-09: 1 via TOPICAL

## 2020-09-09 MED ORDER — BUDESONIDE 0.5 MG/2ML IN SUSP
0.5000 mg | Freq: Two times a day (BID) | RESPIRATORY_TRACT | Status: DC
  Administered 2020-09-09 – 2020-09-14 (×10): 0.5 mg via RESPIRATORY_TRACT
  Filled 2020-09-09 (×10): qty 2

## 2020-09-09 MED ORDER — VANCOMYCIN HCL 1750 MG/350ML IV SOLN
1750.0000 mg | INTRAVENOUS | Status: DC
  Filled 2020-09-09: qty 350

## 2020-09-09 MED ORDER — LIDOCAINE HCL (CARDIAC) PF 100 MG/5ML IV SOSY
PREFILLED_SYRINGE | INTRAVENOUS | Status: DC | PRN
  Administered 2020-09-09: 60 mg via INTRAVENOUS

## 2020-09-09 MED ORDER — ACETAMINOPHEN 10 MG/ML IV SOLN
INTRAVENOUS | Status: DC | PRN
  Administered 2020-09-09: 1000 mg via INTRAVENOUS

## 2020-09-09 MED ORDER — UMECLIDINIUM BROMIDE 62.5 MCG/INH IN AEPB
1.0000 | INHALATION_SPRAY | Freq: Every day | RESPIRATORY_TRACT | Status: DC
  Administered 2020-09-09 – 2020-09-14 (×6): 1 via RESPIRATORY_TRACT
  Filled 2020-09-09 (×2): qty 7

## 2020-09-09 MED ORDER — PROPOFOL 10 MG/ML IV BOLUS
INTRAVENOUS | Status: DC | PRN
  Administered 2020-09-09: 140 mg via INTRAVENOUS

## 2020-09-09 MED ORDER — VANCOMYCIN HCL IN DEXTROSE 1-5 GM/200ML-% IV SOLN
1000.0000 mg | Freq: Once | INTRAVENOUS | Status: AC
  Administered 2020-09-09: 1000 mg via INTRAVENOUS
  Filled 2020-09-09: qty 200

## 2020-09-09 MED ORDER — LACTATED RINGERS IV BOLUS (SEPSIS)
1000.0000 mL | Freq: Once | INTRAVENOUS | Status: AC
  Administered 2020-09-09: 1000 mL via INTRAVENOUS

## 2020-09-09 MED ORDER — MORPHINE SULFATE (PF) 4 MG/ML IV SOLN
4.0000 mg | Freq: Once | INTRAVENOUS | Status: AC
  Administered 2020-09-09: 4 mg via INTRAVENOUS
  Filled 2020-09-09: qty 1

## 2020-09-09 MED ORDER — SODIUM CHLORIDE 0.9 % IV SOLN
3.0000 g | Freq: Four times a day (QID) | INTRAVENOUS | Status: DC
  Administered 2020-09-09 – 2020-09-14 (×20): 3 g via INTRAVENOUS
  Filled 2020-09-09 (×6): qty 8
  Filled 2020-09-09: qty 3
  Filled 2020-09-09 (×3): qty 8
  Filled 2020-09-09: qty 3
  Filled 2020-09-09 (×8): qty 8
  Filled 2020-09-09: qty 3
  Filled 2020-09-09 (×3): qty 8

## 2020-09-09 MED ORDER — INSULIN ASPART 100 UNIT/ML IJ SOLN
0.0000 [IU] | Freq: Three times a day (TID) | INTRAMUSCULAR | Status: DC
  Administered 2020-09-10 (×2): 1 [IU] via SUBCUTANEOUS
  Administered 2020-09-10: 2 [IU] via SUBCUTANEOUS
  Filled 2020-09-09 (×3): qty 1

## 2020-09-09 MED ORDER — EPHEDRINE SULFATE 50 MG/ML IJ SOLN
INTRAMUSCULAR | Status: DC | PRN
  Administered 2020-09-09: 10 mg via INTRAVENOUS

## 2020-09-09 MED ORDER — ONDANSETRON HCL 4 MG/2ML IJ SOLN
INTRAMUSCULAR | Status: DC | PRN
  Administered 2020-09-09: 4 mg via INTRAVENOUS

## 2020-09-09 MED ORDER — ONDANSETRON HCL 4 MG/2ML IJ SOLN: 4.0000 mg | Freq: Once | INTRAMUSCULAR | Status: DC | PRN

## 2020-09-09 MED ORDER — RABIES VACCINE, PCEC IM SUSR
1.0000 mL | Freq: Once | INTRAMUSCULAR | Status: AC
  Administered 2020-09-09: 1 mL via INTRAMUSCULAR
  Filled 2020-09-09: qty 1

## 2020-09-09 MED ORDER — ACETAMINOPHEN 10 MG/ML IV SOLN
INTRAVENOUS | Status: AC
  Filled 2020-09-09: qty 100

## 2020-09-09 MED ORDER — MIDAZOLAM HCL 2 MG/2ML IJ SOLN
INTRAMUSCULAR | Status: DC | PRN
  Administered 2020-09-09: 2 mg via INTRAVENOUS

## 2020-09-09 MED ORDER — ALBUTEROL SULFATE HFA 108 (90 BASE) MCG/ACT IN AERS
INHALATION_SPRAY | RESPIRATORY_TRACT | Status: AC
  Filled 2020-09-09: qty 6.7

## 2020-09-09 MED ORDER — FENTANYL CITRATE (PF) 100 MCG/2ML IJ SOLN
INTRAMUSCULAR | Status: DC | PRN
  Administered 2020-09-09 (×2): 50 ug via INTRAVENOUS

## 2020-09-09 MED ORDER — FENTANYL CITRATE (PF) 100 MCG/2ML IJ SOLN
INTRAMUSCULAR | Status: AC
  Administered 2020-09-09: 25 ug via INTRAVENOUS
  Filled 2020-09-09: qty 2

## 2020-09-09 MED ORDER — ONDANSETRON HCL 4 MG/2ML IJ SOLN
4.0000 mg | Freq: Once | INTRAMUSCULAR | Status: AC
  Administered 2020-09-09: 4 mg via INTRAVENOUS
  Filled 2020-09-09: qty 2

## 2020-09-09 MED ORDER — LORATADINE 10 MG PO TABS: 10.0000 mg | ORAL_TABLET | Freq: Every day | ORAL | Status: DC | PRN

## 2020-09-09 MED ORDER — LACTATED RINGERS IV BOLUS (SEPSIS)
500.0000 mL | Freq: Once | INTRAVENOUS | Status: AC
  Administered 2020-09-09: 500 mL via INTRAVENOUS

## 2020-09-09 SURGICAL SUPPLY — 34 items
APL PRP STRL LF DISP 70% ISPRP (MISCELLANEOUS) ×1
BLADE CLIPPER SURG (BLADE) IMPLANT
BLADE SURG 15 STRL LF DISP TIS (BLADE) ×1 IMPLANT
BLADE SURG 15 STRL SS (BLADE) ×2
BNDG GAUZE 4.5X4.1 6PLY STRL (MISCELLANEOUS) IMPLANT
BNDG GAUZE ELAST 4 BULKY (GAUZE/BANDAGES/DRESSINGS) ×1 IMPLANT
CHLORAPREP W/TINT 26 (MISCELLANEOUS) ×2 IMPLANT
COVER WAND RF STERILE (DRAPES) ×2 IMPLANT
DRAIN PENROSE 12X.25 LTX STRL (MISCELLANEOUS) ×1 IMPLANT
DRAIN PENROSE 5/8X18 LTX STRL (DRAIN) IMPLANT
DRAPE LAPAROTOMY 77X122 PED (DRAPES) ×2 IMPLANT
ELECT REM PT RETURN 9FT ADLT (ELECTROSURGICAL) ×2
ELECTRODE REM PT RTRN 9FT ADLT (ELECTROSURGICAL) ×1 IMPLANT
GAUZE SPONGE 4X4 12PLY STRL (GAUZE/BANDAGES/DRESSINGS) ×2 IMPLANT
GLOVE SURG ENC MOIS LTX SZ6.5 (GLOVE) ×2 IMPLANT
GLOVE SURG UNDER POLY LF SZ6.5 (GLOVE) ×2 IMPLANT
GOWN STRL REUS W/ TWL LRG LVL3 (GOWN DISPOSABLE) ×2 IMPLANT
GOWN STRL REUS W/TWL LRG LVL3 (GOWN DISPOSABLE) ×4
KIT TURNOVER KIT A (KITS) ×2 IMPLANT
MANIFOLD NEPTUNE II (INSTRUMENTS) ×2 IMPLANT
NEEDLE HYPO 22GX1.5 SAFETY (NEEDLE) ×1 IMPLANT
NS IRRIG 1000ML POUR BTL (IV SOLUTION) ×2 IMPLANT
PACK BASIN MINOR ARMC (MISCELLANEOUS) ×2 IMPLANT
PAD ABD DERMACEA PRESS 5X9 (GAUZE/BANDAGES/DRESSINGS) IMPLANT
SOL PREP PVP 2OZ (MISCELLANEOUS) ×2
SOLUTION PREP PVP 2OZ (MISCELLANEOUS) ×2 IMPLANT
SPONGE LAP 18X18 RF (DISPOSABLE) ×2 IMPLANT
SUT ETHILON 3-0 FS-10 30 BLK (SUTURE)
SUT VIC AB 2-0 SH 27 (SUTURE) ×2
SUT VIC AB 2-0 SH 27XBRD (SUTURE) IMPLANT
SUTURE EHLN 3-0 FS-10 30 BLK (SUTURE) IMPLANT
SWAB CULTURE AMIES ANAERIB BLU (MISCELLANEOUS) ×1 IMPLANT
SYR 10ML LL (SYRINGE) ×1 IMPLANT
SYR BULB IRRIG 60ML STRL (SYRINGE) ×1 IMPLANT

## 2020-09-09 NOTE — Consult Note (Signed)
PHARMACY -  BRIEF ANTIBIOTIC NOTE   Pharmacy has received consult(s) for Vancomycin from an ED provider.  The patient's profile has been reviewed for ht/wt/allergies/indication/available labs.    One time order(s) placed for Vancomycin 1g IV x 1 dose.  Further antibiotics/pharmacy consults should be ordered by admitting physician if indicated.                       Thank you, Pearla Dubonnet 09/09/2020  11:45 AM

## 2020-09-09 NOTE — Transfer of Care (Signed)
Immediate Anesthesia Transfer of Care Note  Patient: Jimmy Bishop  Procedure(s) Performed: IRRIGATION AND DEBRIDEMENT ABSCESS-RIGHT FOREARM (Right)  Patient Location: PACU  Anesthesia Type:General  Level of Consciousness: awake  Airway & Oxygen Therapy: Patient Spontanous Breathing and Patient connected to face mask oxygen  Post-op Assessment: Report given to RN  Post vital signs: stable  Last Vitals:  Vitals Value Taken Time  BP 98/73 09/09/20 1857  Temp    Pulse 103 09/09/20 1859  Resp 17 09/09/20 1859  SpO2 91 % 09/09/20 1859  Vitals shown include unvalidated device data.  Last Pain:  Vitals:   09/09/20 1706  TempSrc: Temporal  PainSc: 9          Complications: No notable events documented.

## 2020-09-09 NOTE — Consult Note (Signed)
SURGICAL CONSULTATION NOTE   HISTORY OF PRESENT ILLNESS (HPI):  60 y.o. male presented to [facility] ED for evaluation of pain on the right forearm since 2 days ago. Patient reports a dog bite him in the right forearm 2 days ago.  He reported that he has been trying to keep the wound clean but is getting more red, painful and with purulent drainage.  He denies any fever or chills.  Pain localized to the right forearm.  No pain radiation.  Aggravating factor is applying pressure.  There has been no alleviating factors.  At the ED he was found with elevated lactic acid, elevated white blood cell count.  On physical exam he is found with right forearm cellulitis with purulence coming out of the wound.  Surgery is consulted by Dr. Blaine Hamper in this context for evaluation and management of right forearm abscess due to dog bite.  PAST MEDICAL HISTORY (PMH):  Past Medical History:  Diagnosis Date   Arthritis    left knee   Hypertension      PAST SURGICAL HISTORY ([place]):  Past Surgical History:  Procedure Laterality Date   ANKLE SURGERY     COLONOSCOPY WITH PROPOFOL N/A 07/20/2017   Procedure: COLONOSCOPY WITH PROPOFOL;  Surgeon: Toledo, Benay Pike, MD;  Location: [facility] ENDOSCOPY;  Service: Gastroenterology;  Laterality: N/A;   POSTERIOR CERVICAL FUSION/FORAMINOTOMY N/A 04/18/2015   Procedure: Posterior cervical fusion with lateral mass fixation - Cervical three-Thoracic one, cervical laminectomy Cervical three-Cervical seven;  Surgeon: Eustace Moore, MD;  Location: [place] [facility];  Service: Neurosurgery;  Laterality: N/A;     MEDICATIONS:  Prior to Admission medications   Medication Sig Start Date End Date Taking? Authorizing Provider  albuterol (PROVENTIL HFA;VENTOLIN HFA) 108 (90 Base) MCG/ACT inhaler Inhale 2 puffs into the lungs every 6 (six) hours as needed for wheezing or shortness of breath. 05/27/17  Yes Letitia Neri L, PA-C  amLODipine (NORVASC) 10 MG tablet Take 1 tablet by mouth daily. 08/27/20  Yes  [provider]  cetirizine (ZYRTEC) 10 MG tablet Take 10 mg by mouth daily.   Yes [provider]  ferrous sulfate 325 (65 FE) MG tablet Take 1 tablet by mouth daily.   Yes [provider]  FLOVENT HFA 220 MCG/ACT inhaler Inhale 2 puffs into the lungs 2 (two) times daily. 04/16/20  Yes [provider]  lisinopril (ZESTRIL) 20 MG tablet Take 1 tablet by mouth daily. 08/11/20  Yes [provider]  amoxicillin-clavulanate (AUGMENTIN) 875-125 MG tablet Take 1 tablet by mouth 2 (two) times daily. 09/09/20   [provider]  aspirin 81 MG EC tablet Take 1 tablet by mouth daily. Patient not taking: No sig reported    [provider]  Cholecalciferol (VITAMIN D3) 1.25 MG (50000 UT) CAPS Take 1 capsule by mouth once a week. 08/09/20   [provider]  lisinopril (ZESTRIL) 10 MG tablet Take 20 mg by mouth daily. Patient not taking: No sig reported 10/18/19   [provider]  meloxicam (MOBIC) 15 MG tablet Take 1 tablet (15 mg total) by mouth daily. Patient not taking: No sig reported 07/31/17   Cuthriell, Charline Bills, PA-C  oxyCODONE-acetaminophen (PERCOCET/ROXICET) 5-325 MG tablet Take 1 tablet by mouth every 6 (six) hours as needed for severe pain. Patient not taking: No sig reported 07/31/17   Cuthriell, Charline Bills, PA-C  pravastatin (PRAVACHOL) 40 MG tablet Take 40 mg by mouth daily. Patient not taking: Reported on 09/09/2020 10/19/19   [provider]  STIOLTO RESPIMAT 2.5-2.5 MCG/ACT AERS SMARTSIG:2 Puff(s) Via Inhaler Every Morning 08/22/19   [provider]     ALLERGIES:  Allergies  Allergen Reactions   Vicodin [Hydrocodone-Acetaminophen] Hives and Rash     SOCIAL HISTORY:  Social History   Socioeconomic History   Marital status: Single    Spouse name: Not on file   Number of children: Not on file   Years of education: Not on file   Highest education level: Not on file  Occupational History    Not on file  Tobacco Use   Smoking status: Every Day    Packs/day: 1.00    Years: 40.00    Pack years: 40.00    Types: Cigarettes   Smokeless tobacco: Never  Substance and Sexual Activity   Alcohol use: Yes    Comment: occ   Drug use: No   Sexual activity: Not on file  Other Topics Concern   Not on file  Social History Narrative   Not on file   Social Determinants of Health   Financial Resource Strain: Not on file  Food Insecurity: Not on file  Transportation Needs: Not on file  Physical Activity: Not on file  Stress: Not on file  Social Connections: Not on file  Intimate Partner Violence: Not on file      FAMILY HISTORY:  No family history on file.   REVIEW OF SYSTEMS:  Constitutional: denies weight loss, fever, chills, or sweats  Eyes: denies any other vision changes, history of eye injury  ENT: denies sore throat, hearing problems  Respiratory: denies shortness of breath, wheezing  Cardiovascular: denies chest pain, palpitations  Gastrointestinal: abdominal pain, nausea and vomiting Genitourinary: denies burning with urination or urinary frequency Musculoskeletal: Positive for joint pains or cramps  Skin: denies any other rashes or skin discolorations  Neurological: denies any other headache, dizziness, weakness  Psychiatric: denies any other depression, anxiety   All other review of systems were negative   VITAL SIGNS:  Temp:  [97.8 F (36.6 C)] 97.8 F (36.6 C) (06/21 1038) Pulse Rate:  [82-104] 82 (06/21 1228) Resp:  [16-17] 16 (06/21 1228) BP: (89-118)/(59-76) 118/76 (06/21 1228) SpO2:  [97 %-98 %] 98 % (06/21 1228) Weight:  [82.6 kg] 82.6 kg (06/21 1039)     Height: 5\' 9"  (175.3 cm) Weight: 82.6 kg BMI (Calculated): 26.86   INTAKE/OUTPUT:  This shift: No intake/output data recorded.  Last 2 shifts: @IOLAST2SHIFTS @   PHYSICAL EXAM:  Constitutional:  -- Normal body habitus  -- Awake, alert, and oriented x3  Eyes:  -- Pupils equally round and  reactive to light  -- No scleral icterus  Ear, nose, and throat:  -- No jugular venous distension  Pulmonary:  -- No crackles  -- Equal breath sounds bilaterally -- Breathing non-labored at rest Cardiovascular:  -- S1, S2 present  -- No pericardial rubs Gastrointestinal:  -- Abdomen soft, nontender, non-distended, no guarding or rebound tenderness -- No abdominal masses appreciated, pulsatile or otherwise  Musculoskeletal and Integumentary:  -- Wounds: Right forearm multiple wounds with cellulitis and drainage.  There is fluctuance with crepitus.  Neurologic:  -- Motor function: intact and symmetric -- Sensation: intact and symmetric   Labs:  CBC Latest Ref Rng & Units 09/09/2020 12/01/2019 04/10/2015  WBC 4.0 - 10.5 K/uL 13.3(H) 5.9 7.3  Hemoglobin 13.0 - 17.0 g/dL 14.9 17.2(H) 17.1(H)  Hematocrit 39.0 - 52.0 % 43.5 52.7(H) 50.5  Platelets 150 - 400 K/uL 311 222 283  CMP Latest Ref Rng & Units 09/09/2020 12/01/2019 04/10/2015  Glucose 70 - 99 mg/dL 225(H) 100(H) 111(H)  BUN 6 - 20 mg/dL 19 11 7   Creatinine 0.61 - 1.24 mg/dL 1.19 1.02 0.83  Sodium 135 - 145 mmol/L 131(L) 147(H) 138  Potassium 3.5 - 5.1 mmol/L 3.7 4.1 3.4(L)  Chloride 98 - 111 mmol/L 101 109 102  CO2 22 - 32 mmol/L 19(L) 25 23  Calcium 8.9 - 10.3 mg/dL 9.1 8.8(L) 9.5  Total Protein 6.5 - 8.1 g/dL 7.6 - -  Total Bilirubin 0.3 - 1.2 mg/dL 1.2 - -  Alkaline Phos 38 - 126 U/L 56 - -  AST 15 - 41 U/L 17 - -  ALT 0 - 44 U/L 16 - -   Coagulation:  Lab Results  Component Value Date   INR 1.2 09/09/2020   APTT 44 (H) 09/09/2020    Imaging studies:  I personally evaluated the x-ray of the right forearm.  There is no fracture.  Assessment/Plan:  60 y.o. male with right forearm cellulitis and abscess, complicated by pertinent comorbidities including diabetes, hypertension.  Patient presented to the emergency room with worsening right forearm dog bite wound with cellulitis, purulence and crepitus.  Elevated  lactic acid elevated white blood cell count.  I discussed with the patient the recommendation of continued debridement for sepsis control.  Patient agreed to proceed.  He was started on antibiotic therapy already.  Patient admitted by hospitalist.  We will take patient to the operating room for cleansing and debridement.  Arnold Long, MD

## 2020-09-09 NOTE — Anesthesia Procedure Notes (Addendum)
Procedure Name: LMA Insertion Date/Time: 09/09/2020 6:17 PM Performed by: Lerry Liner, CRNA Pre-anesthesia Checklist: Patient identified, Emergency Drugs available, Suction available and Patient being monitored Patient Re-evaluated:Patient Re-evaluated prior to induction Oxygen Delivery Method: Circle system utilized Preoxygenation: Pre-oxygenation with 100% oxygen Induction Type: IV induction Ventilation: Mask ventilation without difficulty LMA: LMA inserted LMA Size: 4.0 Number of attempts: 1 Tube secured with: Tape Dental Injury: Teeth and Oropharynx as per pre-operative assessment

## 2020-09-09 NOTE — Progress Notes (Signed)
CODE SEPSIS - PHARMACY COMMUNICATION  **Broad Spectrum Antibiotics should be administered within 1 hour of Sepsis diagnosis**  Time Code Sepsis Called/Page Received: 1137  Antibiotics Ordered: Rocephin, Vancomycin  Time of 1st antibiotic administration: 1157  Additional action taken by pharmacy: none  If necessary, Name of Provider/Nurse Contacted: n/a    Pearla Dubonnet ,PharmD Clinical Pharmacist  09/09/2020  12:16 PM

## 2020-09-09 NOTE — Consult Note (Signed)
Pharmacy Antibiotic Note  Jimmy Bishop is a 60 y.o. male admitted on 09/09/2020 with right arm cellulitis afer dog bite.   Pharmacy has been consulted for Unasyn/Vancomcyin dosing.  Plan: Will dose Vancomycin 1750mg  q24h (Starting with 750mg  added to 1000mg  for total load of 1750mg ) Goal AUC 400-550. Expected AUC: 497 SCr used: 1.19  Will dose Unasyn 3g q6h  Height: 5\' 9"  (175.3 cm) Weight: 82.6 kg (182 lb) IBW/kg (Calculated) : 70.7  Temp (24hrs), Avg:97.8 F (36.6 C), Min:97.8 F (36.6 C), Max:97.8 F (36.6 C)  Recent Labs  Lab 09/09/20 1043 09/09/20 1146  WBC 13.3*  --   CREATININE 1.19  --   LATICACIDVEN 2.4* 1.7    Estimated Creatinine Clearance: 66 mL/min (by C-G formula based on SCr of 1.19 mg/dL).    Allergies  Allergen Reactions   Vicodin [Hydrocodone-Acetaminophen] Hives and Rash    Antimicrobials this admission: Ceftriaxone 2g IV x 1  Unasyn 6/21 >> Vancomycin 6/21 >>  Dose adjustments this admission N/A  Microbiology results: 6/21 BCx: pending   Thank you for allowing pharmacy to be a part of this patient's care.  Lu Duffel, PharmD, BCPS Clinical Pharmacist 09/09/2020 2:30 PM

## 2020-09-09 NOTE — ED Triage Notes (Signed)
Pt states he was bit on the right FA on Sunday by a stray dog, states they called animal control but never found the dog, pt has noted redness with swelling of the arm.

## 2020-09-09 NOTE — ED Notes (Signed)
Surgery at bedside, patient to go to OR later this afternoon.

## 2020-09-09 NOTE — H&P (Signed)
History and Physical    NERO SAWATZKY BWI:[id number] DOB: 09/13/1960 DOA: 09/09/2020  Referring MD/NP/PA:   PCP: [place]   Patient coming from:  The patient is coming from home.  At baseline, pt is independent for most of ADL.        Chief Complaint: right arm pain after dog bite  HPI: Jimmy Bishop is a 60 y.o. male with medical history significant of HTN, HLD, DM, COPD, tobacco abuse, anemia, who presents with right arm pain after dog bite.  Pt states that he was bitten by a stray dog on Sunday, then developed pain and swelling in right forearm, which has been progressively worsening.  He also developed redness and pus drainage from the 2 bite sites. He has used peroxide, mertholiate, and alcohol to clean the wounds without improvement.  He denies fever or chills.  No nausea, vomiting, diarrhea or abdominal pain.  No symptoms of UTI.  Denies chest pain, cough, shortness of breath.  Patient was given Augmentin prescription, but has not started taking this medication yet.  Patient was initially hypotensive with a blood pressure 89/59, which improved to 118/76 after giving 2.5 L LR bolus in ED.   ED Course: pt was found to have WBC 13.3, lactic acid of 2.4, INR 1.2, negative COVID PCR, renal function okay, temperature normal, heart rate of 104, RR 17, oxygen saturation 97% on room air.  Chest x-ray negative.  Patient is admitted to [place] bed as inpatient.  Dr. Peyton Najjar of surgery is consulted.  X-ray of right forearm showed: 1. Approximately 3 mm geometric radiodensity along the volar and radial aspect of the proximal forearm, suspicious for a foreign body. Additional punctate, faint more distal densities most likely represent vascular calcifications. 2. Extensive surrounding subcutaneous and muscular edema of the forearm, which could represent cellulitis/myositis and/or contusion/hematoma. 3. No evidence of acute fracture.   Review of Systems:   General: no  fevers, chills, no body weight gain, has fatigue HEENT: no blurry vision, hearing changes or sore throat Respiratory: no dyspnea, coughing, wheezing CV: no chest pain, no palpitations GI: no nausea, vomiting, abdominal pain, diarrhea, constipation GU: no dysuria, burning on urination, increased urinary frequency, hematuria  Ext: no leg edema. Has pain, swelling, erythema in right forearm, with pus drainage. Neuro: no unilateral weakness, numbness, or tingling, no vision change or hearing loss Skin: no rash, no skin tear. MSK: No muscle spasm, no deformity, no limitation of range of movement in spin Heme: No easy bruising.  Travel history: No recent long distant travel.  Allergy:  Allergies  Allergen Reactions   Vicodin [Hydrocodone-Acetaminophen] Hives and Rash    Past Medical History:  Diagnosis Date   Arthritis    left knee   COPD (chronic obstructive pulmonary disease) (HCC)    Diabetes mellitus without complication ([place])    Hypertension     Past Surgical History:  Procedure Laterality Date   ANKLE SURGERY     COLONOSCOPY WITH PROPOFOL N/A 07/20/2017   Procedure: COLONOSCOPY WITH PROPOFOL;  Surgeon: Toledo, Benay Pike, MD;  Location: [facility] ENDOSCOPY;  Service: Gastroenterology;  Laterality: N/A;   POSTERIOR CERVICAL FUSION/FORAMINOTOMY N/A 04/18/2015   Procedure: Posterior cervical fusion with lateral mass fixation - Cervical three-Thoracic one, cervical laminectomy Cervical three-Cervical seven;  Surgeon: Eustace Moore, MD;  Location: [facility];  Service: Neurosurgery;  Laterality: N/A;    Social History:  reports that he has been smoking cigarettes. He has a 40.00 pack-year smoking history.  He has never used smokeless tobacco. He reports current alcohol use. He reports that he does not use drugs.  Family History:  Family History  Problem Relation Age of Onset   Lung cancer Mother    CAD Mother    Lung cancer Sister      Prior to Admission medications   Medication  Sig Start Date End Date Taking? Authorizing Provider  albuterol (PROVENTIL HFA;VENTOLIN HFA) 108 (90 Base) MCG/ACT inhaler Inhale 2 puffs into the lungs every 6 (six) hours as needed for wheezing or shortness of breath. 05/27/17  Yes Letitia Neri L, PA-C  amLODipine (NORVASC) 10 MG tablet Take 1 tablet by mouth daily. 08/27/20  Yes [provider]  cetirizine (ZYRTEC) 10 MG tablet Take 10 mg by mouth daily.   Yes [provider]  ferrous sulfate 325 (65 FE) MG tablet Take 1 tablet by mouth daily.   Yes [provider]  FLOVENT HFA 220 MCG/ACT inhaler Inhale 2 puffs into the lungs 2 (two) times daily. 04/16/20  Yes [provider]  lisinopril (ZESTRIL) 20 MG tablet Take 1 tablet by mouth daily. 08/11/20  Yes [provider]  amoxicillin-clavulanate (AUGMENTIN) 875-125 MG tablet Take 1 tablet by mouth 2 (two) times daily. 09/09/20   [provider]  aspirin 81 MG EC tablet Take 1 tablet by mouth daily. Patient not taking: No sig reported    [provider]  Cholecalciferol (VITAMIN D3) 1.25 MG (50000 UT) CAPS Take 1 capsule by mouth once a week. 08/09/20   [provider]  lisinopril (ZESTRIL) 10 MG tablet Take 20 mg by mouth daily. Patient not taking: No sig reported 10/18/19   [provider]  meloxicam (MOBIC) 15 MG tablet Take 1 tablet (15 mg total) by mouth daily. Patient not taking: No sig reported 07/31/17   Cuthriell, Charline Bills, PA-C  oxyCODONE-acetaminophen (PERCOCET/ROXICET) 5-325 MG tablet Take 1 tablet by mouth every 6 (six) hours as needed for severe pain. Patient not taking: No sig reported 07/31/17   Cuthriell, Charline Bills, PA-C  pravastatin (PRAVACHOL) 40 MG tablet Take 40 mg by mouth daily. Patient not taking: Reported on 09/09/2020 10/19/19   [provider]  [place] 2.5-2.5 MCG/ACT AERS SMARTSIG:2 Puff(s) Via Inhaler Every Morning 08/22/19   [provider]    Physical Exam: Vitals:    09/09/20 1500 09/09/20 1530 09/09/20 1600 09/09/20 1706  BP: 119/61 112/61 100/60 112/67  Pulse: 73 74 74 76  Resp:    18  Temp:    (!) 97 F (36.1 C)  TempSrc:    Temporal  SpO2: 99% 99% 96% 97%  Weight:    82.6 kg  Height:    '5\' 9"'$  (1.753 m)   General: Not in acute distress HEENT:       Eyes: PERRL, EOMI, no scleral icterus.       ENT: No discharge from the ears and nose, no pharynx injection, no tonsillar enlargement.        Neck: No JVD, no bruit, no mass felt. Heme: No neck lymph node enlargement. Cardiac: S1/S2, RRR, No murmurs, No gallops or rubs. Respiratory: No rales, wheezing, rhonchi or rubs. GI: Soft, nondistended, nontender, no rebound pain, no organomegaly, BS present. GU: No hematuria Ext: No pitting leg edema bilaterally. 1+DP/PT pulse bilaterally. Musculoskeletal: No joint deformities, No joint redness or warmth, no limitation of ROM in spin. Skin: Has pain, swelling, erythema in right forearm, with pus drainage from 2 small wound.  Neuro: Alert, oriented X3, cranial nerves II-XII grossly intact, moves all extremities normally. Psych: Patient is not psychotic, no suicidal or hemocidal ideation.  Labs on Admission: I have personally reviewed following labs and imaging studies  CBC: Recent Labs  Lab 09/09/20 1043  WBC 13.3*  NEUTROABS 10.4*  HGB 14.9  HCT 43.5  MCV 97.1  PLT 478   Basic Metabolic Panel: Recent Labs  Lab 09/09/20 1043  NA 131*  K 3.7  CL 101  CO2 19*  GLUCOSE 225*  BUN 19  CREATININE 1.19  CALCIUM 9.1   GFR: Estimated Creatinine Clearance: 66 mL/min (by C-G formula based on SCr of 1.19 mg/dL). Liver Function Tests: Recent Labs  Lab 09/09/20 1043  AST 17  ALT 16  ALKPHOS 56  BILITOT 1.2  PROT 7.6  ALBUMIN 3.7   No results for input(s): LIPASE, AMYLASE in the last 168 hours. No results for input(s): AMMONIA in the last 168 hours. Coagulation Profile: Recent Labs  Lab 09/09/20 1146  INR 1.2   Cardiac  Enzymes: No results for input(s): CKTOTAL, CKMB, CKMBINDEX, TROPONINI in the last 168 hours. BNP (last 3 results) No results for input(s): PROBNP in the last 8760 hours. HbA1C: No results for input(s): HGBA1C in the last 72 hours. CBG: Recent Labs  Lab 09/09/20 1656  GLUCAP 99   Lipid Profile: No results for input(s): CHOL, HDL, LDLCALC, TRIG, CHOLHDL, LDLDIRECT in the last 72 hours. Thyroid Function Tests: No results for input(s): TSH, T4TOTAL, FREET4, T3FREE, THYROIDAB in the last 72 hours. Anemia Panel: No results for input(s): VITAMINB12, FOLATE, FERRITIN, TIBC, IRON, RETICCTPCT in the last 72 hours. Urine analysis: No results found for: COLORURINE, APPEARANCEUR, LABSPEC, PHURINE, GLUCOSEU, HGBUR, BILIRUBINUR, KETONESUR, PROTEINUR, UROBILINOGEN, NITRITE, LEUKOCYTESUR Sepsis Labs: $[email or web address]'[id number]$ (procalcitonin:4,lacticidven:4) ) Recent Results (from the past 240 hour(s))  Resp Panel by RT-PCR (Flu A&B, Covid) Nasopharyngeal Swab     Status: None   Collection Time: 09/09/20 11:46 AM   Specimen: Nasopharyngeal Swab; Nasopharyngeal(NP) swabs in vial transport medium  Result Value Ref Range Status   SARS Coronavirus 2 by RT PCR NEGATIVE NEGATIVE Final    Comment: (NOTE) SARS-CoV-2 target nucleic acids are NOT DETECTED.  The SARS-CoV-2 RNA is generally detectable in upper respiratory specimens during the acute phase of infection. The lowest concentration of SARS-CoV-2 viral copies this assay can detect is 138 copies/mL. A negative result does not preclude SARS-Cov-2 infection and should not be used as the sole basis for treatment or other patient management decisions. A negative result may occur with  improper specimen collection/handling, submission of specimen other than nasopharyngeal swab, presence of viral mutation(s) within the areas targeted by this assay, and inadequate number of viral copies(<138 copies/mL). A negative result must be combined with clinical observations,  patient history, and epidemiological information. The expected result is Negative.  Fact Sheet for Patients:  EntrepreneurPulse.com.au  Fact Sheet for Healthcare Providers:  IncredibleEmployment.be  This test is no t yet approved or cleared by the [place] FDA and  has been authorized for detection and/or diagnosis of SARS-CoV-2 by FDA under an Emergency Use Authorization (EUA). This EUA will remain  in effect (meaning this test can be used) for the duration of the COVID-19 declaration under Section 564(b)(1) of the Act, 21 U.S.C.section 360bbb-3(b)(1), unless the authorization is terminated  or revoked sooner.       Influenza A by PCR NEGATIVE NEGATIVE Final   Influenza B by PCR NEGATIVE NEGATIVE Final    Comment: (NOTE) The Xpert Xpress  SARS-CoV-2/FLU/RSV plus assay is intended as an aid in the diagnosis of influenza from Nasopharyngeal swab specimens and should not be used as a sole basis for treatment. Nasal washings and aspirates are unacceptable for Xpert Xpress SARS-CoV-2/FLU/RSV testing.  Fact Sheet for Patients: EntrepreneurPulse.com.au  Fact Sheet for Healthcare Providers: IncredibleEmployment.be  This test is not yet approved or cleared by the [place] FDA and has been authorized for detection and/or diagnosis of SARS-CoV-2 by FDA under an Emergency Use Authorization (EUA). This EUA will remain in effect (meaning this test can be used) for the duration of the COVID-19 declaration under Section 564(b)(1) of the Act, 21 U.S.C. section 360bbb-3(b)(1), unless the authorization is terminated or revoked.  Performed at [facility], [address] [place], [place] [address]      Radiological Exams on Admission: DG Forearm Right  Result Date: 09/09/2020 CLINICAL DATA:  Pain and swelling after dog bite 2 days ago. EXAM: RIGHT FOREARM - 2 VIEW COMPARISON:  None. FINDINGS: No  evidence of acute fracture. Approximately 3 mm geometric radiodensity along the volar and radial aspect of the proximal forearm. Additional punctate, faint densities more distally. Extensive surrounding subcutaneous and muscular edema of the forearm. No bony erosive change. IMPRESSION: 1. Approximately 3 mm geometric radiodensity along the volar and radial aspect of the proximal forearm, suspicious for a foreign body. Additional punctate, faint more distal densities most likely represent vascular calcifications. 2. Extensive surrounding subcutaneous and muscular edema of the forearm, which could represent cellulitis/myositis and/or contusion/hematoma. 3. No evidence of acute fracture. Electronically Signed   By: Margaretha Sheffield MD   On: 09/09/2020 12:37   DG Chest Port 1 View  Result Date: 09/09/2020 CLINICAL DATA:  Dog bite, possible sepsis EXAM: PORTABLE CHEST 1 VIEW COMPARISON:  12/01/2019 FINDINGS: No consolidation or edema. No pleural effusion or pneumothorax. Normal heart size. Partially imaged cervicothoracic fusion hardware. IMPRESSION: No acute process in the chest. Electronically Signed   By: Macy Mis M.D.   On: 09/09/2020 12:33     EKG: I have personally reviewed.  Sinus rhythm, QTC 415, low voltage, poor R wave progression  Assessment/Plan Principal Problem:   Right forearm cellulitis Active Problems:   Dog bite   Hypertension   Severe sepsis (HCC)   HLD (hyperlipidemia)   Tobacco abuse   Diabetes mellitus without complication (HCC)   COPD (chronic obstructive pulmonary disease) (HCC)   Abscess of right forearm  Severe sepsis due to right forearm cellulitis and abscess: Patient meets criteria for severe sepsis with leukocytosis with WBC 13.3, tachycardia with heart rate of 104.  Lactic acid is elevated 2.4.  Initially hypotensive which responded to IV fluid resuscitation.  Dr. Peyton Najjar of general surgery is consulted for possible surgical debridement.  - Admitted to MedSurg  bed as inpatient - Placed on MedSurg bed for observation - Empiric antimicrobial treatment with vancomycin and unasyn (patient received 1 dose of Rocephin in ED) - PRN Zofran for nausea, morphine and Percocet for pain - Blood cultures x 2  - ESR and CRP - will get Procalcitonin and trend lactic acid levels per sepsis protocol. - IVF: 3.5 L of NS bolus in ED, followed by 125 cc/h  Dog bite: -Patient was given rabies immunoglobulin and rabies vaccine in ED  Hypertension -Hold all blood pressure medications due to hypotension and severe sepsis  HLD (hyperlipidemia): Patient is currently not taking pravastatin. -Follow-up with PCP  Tobacco abuse -Nicotine patch  Diabetes mellitus without complication [facility]): Patient taking metformin  at home -Adding scale insulin  COPD (chronic obstructive pulmonary disease) ([place]): Stable -Bronchodilators        DVT ppx: SCD Code Status: Full code Family Communication: I called his father's number, his stepmother picked up the phone who will inform family members about patient status. Disposition Plan:  Anticipate discharge back to previous environment Consults called:  Dr. Peyton Najjar of general surgery Admission status and Level of care: Med-Surg:   as inpt      Status is: Inpatient  Remains inpatient appropriate because:Inpatient level of care appropriate due to severity of illness  Dispo: The patient is from: Home              Anticipated d/c is to: Home              Patient currently is not medically stable to d/c.   Difficult to place patient No          Date of Service 09/09/2020    [facility]   If 7PM-7AM, please contact night-coverage [email or web address] 09/09/2020, 6:35 PM

## 2020-09-09 NOTE — Anesthesia Postprocedure Evaluation (Signed)
Anesthesia Post Note  Patient: Jimmy Bishop  Procedure(s) Performed: IRRIGATION AND DEBRIDEMENT ABSCESS-RIGHT FOREARM (Right)  Patient location during evaluation: PACU Anesthesia Type: General Level of consciousness: awake and alert Pain management: pain level controlled Vital Signs Assessment: post-procedure vital signs reviewed and stable Respiratory status: spontaneous breathing and respiratory function stable Cardiovascular status: stable Anesthetic complications: no   No notable events documented.   Last Vitals:  Vitals:   09/09/20 1706 09/09/20 1855  BP: 112/67 98/73  Pulse: 76 90  Resp: 18 16  Temp: (!) 36.1 C 36.8 C  SpO2: 97% 97%    Last Pain:  Vitals:   09/09/20 1706  TempSrc: Temporal  PainSc: 9                  Quavion Boule,Kento K

## 2020-09-09 NOTE — Progress Notes (Signed)
Elink following Code Sepsis. 

## 2020-09-09 NOTE — ED Provider Notes (Signed)
[facility] Provider Note  ____________________________________________  Time seen: Approximately 11:12 AM  I have reviewed the triage vital signs and the nursing notes.   HISTORY  Chief Complaint Animal Bite   HPI Jimmy Bishop is a 60 y.o. male who presents to the emergency department for treatment and evaluation of wound to the right forearm. He was bitten by a stray dog on Sunday and has had increase in swelling, pain, redness, and now drainage. He has used peroxide, mertholiate, and alcohol to clean the wounds without improvement. He is diabetic on metformin and hypertensive on 2 medications. Last Tdap was about 7 years ago. He had not seen the dog before and has not seen it since. Bite was unprovoked.   Past Medical History:  Diagnosis Date   Arthritis    left knee   COPD (chronic obstructive pulmonary disease) ([place])    Diabetes mellitus without complication ([place])    Hypertension     Patient Active Problem List   Diagnosis Date Noted   Right forearm cellulitis 09/09/2020   Dog bite 09/09/2020   COPD (chronic obstructive pulmonary disease) ([place]) 09/09/2020   Abscess of right forearm 09/09/2020   Hypertension    Severe sepsis ([place])    HLD (hyperlipidemia)    Tobacco abuse    Diabetes mellitus without complication ([place])    S/P cervical spinal fusion 04/18/2015    Past Surgical History:  Procedure Laterality Date   ANKLE SURGERY     COLONOSCOPY WITH PROPOFOL N/A 07/20/2017   Procedure: COLONOSCOPY WITH PROPOFOL;  Surgeon: Toledo, Benay Pike, MD;  Location: [facility] ENDOSCOPY;  Service: Gastroenterology;  Laterality: N/A;   IRRIGATION AND DEBRIDEMENT ABSCESS Right 09/09/2020   Procedure: IRRIGATION AND DEBRIDEMENT ABSCESS-RIGHT FOREARM;  Surgeon: [name], MD;  Location: [facility] ORS;  Service: General;  Laterality: Right;   POSTERIOR CERVICAL FUSION/FORAMINOTOMY N/A 04/18/2015   Procedure: Posterior cervical fusion with  lateral mass fixation - Cervical three-Thoracic one, cervical laminectomy Cervical three-Cervical seven;  Surgeon: Eustace Moore, MD;  Location: [place];  Service: Neurosurgery;  Laterality: N/A;    Prior to Admission medications   Medication Sig Start Date End Date Taking? Authorizing Provider  albuterol (PROVENTIL HFA;VENTOLIN HFA) 108 (90 Base) MCG/ACT inhaler Inhale 2 puffs into the lungs every 6 (six) hours as needed for wheezing or shortness of breath. 05/27/17  Yes Letitia Neri L, PA-C  amLODipine (NORVASC) 10 MG tablet Take 1 tablet by mouth daily. 08/27/20  Yes [provider]  cetirizine (ZYRTEC) 10 MG tablet Take 10 mg by mouth daily.   Yes [provider]  ferrous sulfate 325 (65 FE) MG tablet Take 1 tablet by mouth daily.   Yes [provider]  FLOVENT HFA 220 MCG/ACT inhaler Inhale 2 puffs into the lungs 2 (two) times daily. 04/16/20  Yes [provider]  lisinopril (ZESTRIL) 20 MG tablet Take 1 tablet by mouth daily. 08/11/20  Yes [provider]  metFORMIN (GLUCOPHAGE) 500 MG tablet Take 500 mg by mouth 2 (two) times daily with a meal.   Yes [provider]  amoxicillin-clavulanate (AUGMENTIN) 875-125 MG tablet Take 1 tablet by mouth 2 (two) times daily. 09/09/20   [provider]  aspirin 81 MG EC tablet Take 1 tablet by mouth daily. Patient not taking: No sig reported    [provider]  Cholecalciferol (VITAMIN D3) 1.25 MG (50000 UT) CAPS Take 1 capsule by mouth once a week. 08/09/20   [provider]  lisinopril (ZESTRIL) 10 MG tablet Take 20 mg by mouth daily. Patient not taking: No sig reported 10/18/19   [provider]  meloxicam (MOBIC) 15 MG tablet Take 1 tablet (15 mg total) by mouth daily. Patient not taking: No sig reported 07/31/17   Cuthriell, Charline Bills, PA-C  oxyCODONE-acetaminophen (PERCOCET/ROXICET) 5-325 MG tablet Take 1 tablet by mouth every 6 (six) hours as needed for  severe pain. Patient not taking: No sig reported 07/31/17   Cuthriell, Charline Bills, PA-C  pravastatin (PRAVACHOL) 40 MG tablet Take 40 mg by mouth daily. Patient not taking: Reported on 09/09/2020 10/19/19   [provider]  STIOLTO RESPIMAT 2.5-2.5 MCG/ACT AERS SMARTSIG:2 Puff(s) Via Inhaler Every Morning 08/22/19   [provider]    Allergies Vicodin [hydrocodone-acetaminophen]  Family History  Problem Relation Age of Onset   Lung cancer Mother    CAD Mother    Lung cancer Sister     Social History Social History   Tobacco Use   Smoking status: Every Day    Packs/day: 1.00    Years: 40.00    Pack years: 40.00    Types: Cigarettes   Smokeless tobacco: Never  Substance Use Topics   Alcohol use: Yes    Comment: occ   Drug use: No    Review of Systems  Constitutional: Negative for fever. Respiratory: Negative for cough or shortness of breath.  Musculoskeletal: Negative for myalgias Skin: Positive for wounds, swelling, drainage, erythema, and edema. Neurological: Negative for numbness or paresthesias. ____________________________________________   PHYSICAL EXAM:  VITAL SIGNS: ED Triage Vitals  Enc Vitals Group     BP 09/09/20 1038 (!) 89/59     Pulse Rate 09/09/20 1038 (!) 104     Resp 09/09/20 1038 17     Temp 09/09/20 1038 97.8 F (36.6 C)     Temp Source 09/09/20 1038 Oral     SpO2 09/09/20 1038 97 %     Weight 09/09/20 1039 182 lb (82.6 kg)     Height 09/09/20 1039 5\' 9"  (1.753 m)     Head Circumference --      Peak Flow --      Pain Score 09/09/20 1039 9     Pain Loc --      Pain Edu? --      Excl. in [place]? --      Constitutional: Overall well appearing. Eyes: Conjunctivae are clear without discharge or drainage. Nose: No rhinorrhea noted. Mouth/Throat: Airway is patent.  Neck: No stridor. Unrestricted range of motion observed. Cardiovascular: Capillary refill is <3 seconds.  Respiratory: Respirations are even and  unlabored.. Musculoskeletal: Able to flex and extend right elbow. FROM of right wrist and hand. Neurologic: Awake, alert, and oriented x 4.  Skin:  Right forearm with 2 puncture wounds with purulent drainage. Surrounding skin is edematous and erythematous.   ____________________________________________   LABS (all labs ordered are listed, but only abnormal results are displayed)  Labs Reviewed  LACTIC ACID, PLASMA - Abnormal; Notable for the following components:      Result Value   Lactic Acid, Venous 2.4 (*)    All other components within normal limits  COMPREHENSIVE METABOLIC PANEL - Abnormal; Notable for the following components:   Sodium 131 (*)    CO2 19 (*)    Glucose, Bld 225 (*)    All other components within normal limits  CBC WITH DIFFERENTIAL/PLATELET - Abnormal; Notable for the following components:   WBC 13.3 (*)  Neutro Abs 10.4 (*)    Monocytes Absolute 1.3 (*)    Abs Immature Granulocytes 0.12 (*)    All other components within normal limits  APTT - Abnormal; Notable for the following components:   aPTT 44 (*)    All other components within normal limits  SEDIMENTATION RATE - Abnormal; Notable for the following components:   Sed Rate 24 (*)    All other components within normal limits  C-REACTIVE PROTEIN - Abnormal; Notable for the following components:   CRP 11.5 (*)    All other components within normal limits  BASIC METABOLIC PANEL - Abnormal; Notable for the following components:   Glucose, Bld 145 (*)    Calcium 8.8 (*)    All other components within normal limits  CBC - Abnormal; Notable for the following components:   RBC 4.10 (*)    All other components within normal limits  GLUCOSE, CAPILLARY - Abnormal; Notable for the following components:   Glucose-Capillary 112 (*)    All other components within normal limits  GLUCOSE, CAPILLARY - Abnormal; Notable for the following components:   Glucose-Capillary 141 (*)    All other components within  normal limits  GLUCOSE, CAPILLARY - Abnormal; Notable for the following components:   Glucose-Capillary 153 (*)    All other components within normal limits  GLUCOSE, CAPILLARY - Abnormal; Notable for the following components:   Glucose-Capillary 146 (*)    All other components within normal limits  CBC WITH DIFFERENTIAL/PLATELET - Abnormal; Notable for the following components:   RBC 4.09 (*)    Neutro Abs 7.8 (*)    All other components within normal limits  BASIC METABOLIC PANEL - Abnormal; Notable for the following components:   Glucose, Bld 112 (*)    All other components within normal limits  GLUCOSE, CAPILLARY - Abnormal; Notable for the following components:   Glucose-Capillary 117 (*)    All other components within normal limits  GLUCOSE, CAPILLARY - Abnormal; Notable for the following components:   Glucose-Capillary 117 (*)    All other components within normal limits  GLUCOSE, CAPILLARY - Abnormal; Notable for the following components:   Glucose-Capillary 120 (*)    All other components within normal limits  RESP PANEL BY RT-PCR (FLU A&B, COVID) ARPGX2  CULTURE, BLOOD (ROUTINE X 2)  CULTURE, BLOOD (ROUTINE X 2)  AEROBIC/ANAEROBIC CULTURE W GRAM STAIN (SURGICAL/DEEP WOUND)  FUNGUS CULTURE WITH STAIN  LACTIC ACID, PLASMA  PROTIME-INR  BRAIN NATRIURETIC PEPTIDE  HEMOGLOBIN A1C  HIV ANTIBODY (ROUTINE TESTING W REFLEX)  CBG MONITORING, ED  CBG MONITORING, ED   ____________________________________________  EKG  ED ECG REPORT I, Tyyne Cliett, FNP-BC personally viewed and interpreted this ECG.   Date: 09/09/2020  EKG Time: 1219  Rate: 81  Rhythm: normal sinus rhythm, PAC's noted  Axis: normal  Intervals:none  ST&T Change: no ST elevation  ____________________________________________  RADIOLOGY  84mm retained foreign body in  forearm. ____________________________________________   PROCEDURES  Procedures ____________________________________________   INITIAL IMPRESSION / ASSESSMENT AND PLAN / ED COURSE  GAVINO FOUCH is a 60 y.o. male presenting to the emergency department 2 days after being bitten by a stray dog. See HPI for further details. Concern for sepsis due to vital signs and obvious source of infection. Will start protocol.  ----------------------------------------- 12:19 PM on 09/09/2020 ----------------------------------------- Image of the right forearm appears to have a foreign body at/near the site of the bite wound. Will consult surgery for possible wash out especially since he  is septic and diabetic with draining wound and foreign body embedded within.  ----------------------------------------- 12:46 PM on 09/09/2020 -----------------------------------------  Hospitalist service requested. Patient aware and agrees to plan. Dr. Peyton Najjar aware of patient.  CRITICAL CARE Performed by: Sherrie George   Total critical care time: 45 minutes  Critical care time was exclusive of separately billable procedures and treating other patients.  Critical care was necessary to treat or prevent imminent or life-threatening deterioration.  Critical care was time spent personally by me on the following activities: development of treatment plan with patient and/or surrogate as well as nursing, discussions with consultants, evaluation of patient's response to treatment, examination of patient, obtaining history from patient or surrogate, ordering and performing treatments and interventions, ordering and review of laboratory studies, ordering and review of radiographic studies, pulse oximetry and re-evaluation of patient's condition.   Medications  ondansetron (ZOFRAN) injection 4 mg ( Intravenous MAR Unhold 09/09/20 2007)  albuterol (PROVENTIL) (2.5 MG/3ML) 0.083% nebulizer solution 3 mL ( Inhalation  MAR Unhold 09/09/20 2007)  nicotine (NICODERM CQ - dosed in mg/24 hours) patch 21 mg (21 mg Transdermal Not Given 09/11/20 1000)  insulin aspart (novoLOG) injection 0-9 Units (0 Units Subcutaneous Not Given 09/11/20 1200)  insulin aspart (novoLOG) injection 0-5 Units (0 Units Subcutaneous Not Given 09/10/20 2159)  ferrous sulfate tablet 325 mg (325 mg Oral Given 09/11/20 1018)  loratadine (CLARITIN) tablet 10 mg (has no administration in time range)  arformoterol (BROVANA) nebulizer solution 15 mcg (15 mcg Nebulization Given 09/11/20 0740)    And  umeclidinium bromide (INCRUSE ELLIPTA) 62.5 MCG/INH 1 puff (1 puff Inhalation Given 09/11/20 1019)  oxyCODONE-acetaminophen (PERCOCET/ROXICET) 5-325 MG per tablet 1 tablet (1 tablet Oral Given 09/11/20 1018)  acetaminophen (TYLENOL) tablet 650 mg ( Oral MAR Unhold 09/09/20 2007)  Ampicillin-Sulbactam (UNASYN) 3 g in sodium chloride 0.9 % 100 mL IVPB (3 g Intravenous New Bag/Given 09/11/20 1505)  budesonide (PULMICORT) nebulizer solution 0.5 mg (0.5 mg Nebulization Given 09/11/20 0740)  vancomycin (VANCOREADY) IVPB 750 mg/150 mL ( Intravenous MAR Unhold 09/09/20 2007)    Followed by  vancomycin (VANCOREADY) IVPB 1250 mg/250 mL (1,250 mg Intravenous New Bag/Given 09/11/20 1231)  COVID-19 mRNA vaccine (Moderna) injection 0.5 mL (has no administration in time range)  enoxaparin (LOVENOX) injection 40 mg (40 mg Subcutaneous Given 09/10/20 1752)  0.9 %  sodium chloride infusion (has no administration in time range)  morphine 2 MG/ML injection 2 mg (has no administration in time range)  vancomycin (VANCOCIN) IVPB 1000 mg/200 mL premix (0 mg Intravenous Stopped 09/09/20 1342)  cefTRIAXone (ROCEPHIN) 2 g in sodium chloride 0.9 % 100 mL IVPB (0 g Intravenous Stopped 09/09/20 1228)  rabies vaccine (RABAVERT) injection 1 mL (1 mL Intramuscular Given 09/09/20 1158)  rabies immune globulin (HYPERAB/KEDRAB) injection 1,650 Units (1,650 Units Intramuscular Given 09/09/20 1213)  Tdap  (BOOSTRIX) injection 0.5 mL (0.5 mLs Intramuscular Given 09/09/20 1157)  lactated ringers bolus 1,000 mL (0 mLs Intravenous Stopped 09/09/20 1247)    And  lactated ringers bolus 1,000 mL (0 mLs Intravenous Stopped 09/09/20 1358)    And  lactated ringers bolus 500 mL (0 mLs Intravenous Stopped 09/09/20 1312)  morphine 4 MG/ML injection 4 mg (4 mg Intravenous Given 09/09/20 1226)  ondansetron (ZOFRAN) injection 4 mg (4 mg Intravenous Given 09/09/20 1225)  lactated ringers bolus 1,000 mL (1,000 mLs Intravenous New Bag/Given 09/09/20 2037)  chlorhexidine (PERIDEX) 0.12 % solution 15 mL (15 mLs Mouth/Throat Given 09/09/20 1722)     Pertinent labs & imaging  results that were available during my care of the patient were reviewed by me and considered in my medical decision making (see chart for details).  ____________________________________________   FINAL CLINICAL IMPRESSION(S) / ED DIAGNOSES  Final diagnoses:  Sepsis without acute organ dysfunction, due to unspecified organism [facility])  Dog bite of right forearm, initial encounter    ED Discharge Orders     None        Note:  This document was prepared using Dragon voice recognition software and may include unintentional dictation errors.   Victorino Dike, FNP 09/11/20 1629    Merlyn Lot, MD 09/13/20 [phone]

## 2020-09-09 NOTE — Anesthesia Procedure Notes (Signed)
Procedure Name: LMA Insertion Date/Time: 09/09/2020 6:17 PM Performed by: Lerry Liner, CRNA Pre-anesthesia Checklist: Patient identified, Emergency Drugs available, Suction available and Patient being monitored Patient Re-evaluated:Patient Re-evaluated prior to induction Oxygen Delivery Method: Circle system utilized Preoxygenation: Pre-oxygenation with 100% oxygen Induction Type: IV induction Ventilation: Mask ventilation without difficulty LMA: LMA inserted LMA Size: 4.0 Placement Confirmation: positive ETCO2 and CO2 detector Tube secured with: Tape

## 2020-09-09 NOTE — Op Note (Signed)
ATTENDING Surgeon(s): [name], MD   ANESTHESIA: General   PRE-OPERATIVE DIAGNOSIS: Right forearm deep abscess   POST-OPERATIVE DIAGNOSIS: Right forearm necrotizing fasciitis   PROCEDURE(S):  1.) Sharp excisional debridement of right forearm fascia and muscle with drainage of deep muscular abscess    INTRAOPERATIVE FINDINGS:  A 10 cm x 6 cm (60 sq cm) of necrotizing fasciitis of the brachioradialis muscle with abscess deeper to the brachioradialis muscle   ESTIMATED BLOOD LOSS: 25 mL   SPECIMENS: Abscess culture   COMPLICATIONS: None apparent   CONDITION AT END OF PROCEDURE: Hemodynamically stable and awake   INDICATIONS FOR PROCEDURE:  Patient is a 60 y.o. male with right forearm cellulitis and abscess after dog bite 2 days before.  Presented to the ED with elevated lactic acid and leukocytosis with crepitus on physical exam.  Debridement and drainage was indicated for sepsis control.    DETAILS OF PROCEDURE: After informed consent,patient was taken to the OR. Time out performed. General anesthesia induced. Patient placed on supine position. The right forearm was cleaned and draped in sterile fashion.  2 small round skin wounds from the dog by was initially identified.  With #15 blade, elliptical incision was done connecting the 2 dog bites wounds.  Immediately abundant amount of purulent tissue started to drain.  Deep fascia covering the brachioradialis muscle was found to be necrotic and was divided.  Superficial muscle was also necrotic and debrided with electrocautery as well.  There was an abscess identified deep to the brachioradialis.  The brachioradialis was separated from the forearm flexors and deep abscess was drained.  No further necrotic tissue was identified.  The compartment was thoroughly irrigated with saline.  The wound was packed with Dakin's solution wet-to-dry.  Patient tolerated the procedure well.

## 2020-09-09 NOTE — Anesthesia Preprocedure Evaluation (Signed)
Anesthesia Evaluation  Patient identified by MRN, date of birth, ID band Patient awake    Reviewed: Allergy & Precautions, NPO status , Patient's Chart, lab work & pertinent test results  History of Anesthesia Complications Negative for: history of anesthetic complications  Airway Mallampati: II       Dental   Pulmonary neg sleep apnea, COPD,  COPD inhaler, Current Smoker and Patient abstained from smoking.,           Cardiovascular hypertension, Pt. on medications (-) Past MI and (-) CHF (-) dysrhythmias (-) Valvular Problems/Murmurs     Neuro/Psych neg Seizures    GI/Hepatic Neg liver ROS, neg GERD  ,  Endo/Other  diabetes, Type 2, Oral Hypoglycemic Agents  Renal/GU negative Renal ROS     Musculoskeletal   Abdominal   Peds  Hematology   Anesthesia Other Findings   Reproductive/Obstetrics                             Anesthesia Physical Anesthesia Plan  ASA: 3  Anesthesia Plan: General   Post-op Pain Management:    Induction: Intravenous  PONV Risk Score and Plan: 1 and Ondansetron  Airway Management Planned: LMA  Additional Equipment:   Intra-op Plan:   Post-operative Plan:   Informed Consent: I have reviewed the patients History and Physical, chart, labs and discussed the procedure including the risks, benefits and alternatives for the proposed anesthesia with the patient or authorized representative who has indicated his/her understanding and acceptance.       Plan Discussed with:   Anesthesia Plan Comments:         Anesthesia Quick Evaluation

## 2020-09-10 ENCOUNTER — Encounter: Payer: Self-pay | Admitting: General Surgery

## 2020-09-10 DIAGNOSIS — W540XXA Bitten by dog, initial encounter: Secondary | ICD-10-CM | POA: Diagnosis not present

## 2020-09-10 DIAGNOSIS — A419 Sepsis, unspecified organism: Secondary | ICD-10-CM | POA: Diagnosis not present

## 2020-09-10 DIAGNOSIS — L02413 Cutaneous abscess of right upper limb: Secondary | ICD-10-CM | POA: Diagnosis not present

## 2020-09-10 DIAGNOSIS — L03113 Cellulitis of right upper limb: Secondary | ICD-10-CM | POA: Diagnosis not present

## 2020-09-10 DIAGNOSIS — R6520 Severe sepsis without septic shock: Secondary | ICD-10-CM | POA: Diagnosis not present

## 2020-09-10 LAB — CBC
HCT: 40.2 % (ref 39.0–52.0)
Hemoglobin: 13.7 g/dL (ref 13.0–17.0)
MCH: 33.4 pg (ref 26.0–34.0)
MCHC: 34.1 g/dL (ref 30.0–36.0)
MCV: 98 fL (ref 80.0–100.0)
Platelets: 290 10*3/uL (ref 150–400)
RBC: 4.1 MIL/uL — ABNORMAL LOW (ref 4.22–5.81)
RDW: 12.4 % (ref 11.5–15.5)
WBC: 7.9 10*3/uL (ref 4.0–10.5)
nRBC: 0 % (ref 0.0–0.2)

## 2020-09-10 LAB — BASIC METABOLIC PANEL
Anion gap: 5 (ref 5–15)
BUN: 13 mg/dL (ref 6–20)
CO2: 27 mmol/L (ref 22–32)
Calcium: 8.8 mg/dL — ABNORMAL LOW (ref 8.9–10.3)
Chloride: 103 mmol/L (ref 98–111)
Creatinine, Ser: 0.68 mg/dL (ref 0.61–1.24)
GFR, Estimated: >60 mL/min (ref >60)
Glucose, Bld: 145 mg/dL — ABNORMAL HIGH (ref 70–99)
Potassium: 4.6 mmol/L (ref 3.5–5.1)
Sodium: 135 mmol/L (ref 135–145)

## 2020-09-10 LAB — GLUCOSE, CAPILLARY
Glucose-Capillary: 141 mg/dL — ABNORMAL HIGH (ref 70–99)
Glucose-Capillary: 153 mg/dL — ABNORMAL HIGH (ref 70–99)
Glucose-Capillary: 146 mg/dL — ABNORMAL HIGH (ref 70–99)
Glucose-Capillary: 117 mg/dL — ABNORMAL HIGH (ref 70–99)

## 2020-09-10 LAB — HEMOGLOBIN A1C
Hgb A1c MFr Bld: 5.6 % (ref 4.8–5.6)
Mean Plasma Glucose: 114.02 mg/dL

## 2020-09-10 LAB — HIV ANTIBODY (ROUTINE TESTING W REFLEX): HIV Screen 4th Generation wRfx: NONREACTIVE

## 2020-09-10 MED ORDER — LACTATED RINGERS IV SOLN: INTRAVENOUS | Status: DC

## 2020-09-10 MED ORDER — COVID-19 MRNA VACC (MODERNA) 100 MCG/0.5ML IM SUSP
0.5000 mL | Freq: Once | INTRAMUSCULAR | Status: AC
  Administered 2020-09-12: 0.5 mL via INTRAMUSCULAR
  Filled 2020-09-10 (×2): qty 0.5

## 2020-09-10 MED ORDER — VANCOMYCIN HCL 1250 MG/250ML IV SOLN
1250.0000 mg | Freq: Two times a day (BID) | INTRAVENOUS | Status: DC
  Administered 2020-09-10 – 2020-09-12 (×5): 1250 mg via INTRAVENOUS
  Filled 2020-09-10 (×7): qty 250

## 2020-09-10 MED ORDER — ENOXAPARIN SODIUM 40 MG/0.4ML IJ SOSY
40.0000 mg | PREFILLED_SYRINGE | INTRAMUSCULAR | Status: DC
  Administered 2020-09-10 – 2020-09-13 (×4): 40 mg via SUBCUTANEOUS
  Filled 2020-09-10 (×4): qty 0.4

## 2020-09-10 NOTE — Progress Notes (Signed)
PROGRESS NOTE  Jimmy Bishop SAY:[id number] DOB: 1960/08/07 DOA: 09/09/2020 PCP: [place]  HPI/Recap of past 24 hours: HPI from Dr Terie Purser is a 60 y.o. male with medical history significant of HTN, HLD, DM, COPD, tobacco abuse, anemia, who presents with right arm pain after dog bite. Pt states that he was bitten by a stray dog, then developed pain and swelling in right forearm, which has been progressively worsening.  He also developed redness and pus drainage from the 2 bite sites. Patient was given Augmentin prescription, but has not started taking this medication yet. In the ED, pt was initially hypotensive with a blood pressure 89/59, which improved to 118/76 after giving 2.5 L LR bolus, HR 104, WBC 13.3, lactic acid of 2.4, negative COVID PCR, renal function stable.  Chest x-ray negative.  Patient admitted for further management. Gen surgery Dr Peyton Najjar consulted, pt is s/p debridement of R forearm on 09/09/20.    Today, patient still complaining of postop pain on the right forearm, the other new complaints.  No fever/chills noted, denies any chest pain, shortness of breath, abdominal pain, nausea/vomiting.   Assessment/Plan: Principal Problem:   Right forearm cellulitis Active Problems:   Dog bite   Hypertension   Severe sepsis (HCC)   HLD (hyperlipidemia)   Tobacco abuse   Diabetes mellitus without complication (HCC)   COPD (chronic obstructive pulmonary disease) (HCC)   Abscess of right forearm  Severe sepsis due to right forearm necrotizing fasciitis s/p debridement and drainage of deep muscular abscess on 09/09/2020 Severe sepsis with leukocytosis, tachycardia, LA 2.4, initially hypotensive, responded to IVF Currently afebrile, with no leukocytosis BC x2 NGTD General surgery consulted, s/p debridement on 09/09/2020 Surgical cultures pending, gram stain showing moderate gram-positive cocci in pairs, abundant gram-negative rods Continue IV Unasyn,  vancomycin pending final culture Continue IVF Monitor closely   Dog bite Patient was given rabies immunoglobulin and rabies vaccine in ED   Hypertension Continue to hold all blood pressure medications due to severe sepsis and soft BP   Tobacco abuse Advised to quit Nicotine patch   Diabetes mellitus without complication Patient taking metformin at home SSI, accuchecks, hypoglycemic protocol   COPD Stable Bronchodilators   Estimated body mass index is 26.88 kg/m as calculated from the following:   Height as of this encounter: 5' 9.02" (1.753 m).   Weight as of this encounter: 82.6 kg.     Code Status: Full  Family Communication: None at bedside  Disposition Plan: Status is: Inpatient  Remains inpatient appropriate because:Inpatient level of care appropriate due to severity of illness  Dispo: The patient is from: Home              Anticipated d/c is to: Home              Patient currently is not medically stable to d/c.   Difficult to place patient No    Consultants: Gen surg  Procedures: debridement and drainage of deep muscular abscess on 09/09/2020  Antimicrobials: Vancomycin Unasyn  DVT prophylaxis: lovenox   Objective: Vitals:   09/09/20 2314 09/10/20 0405 09/10/20 0828 09/10/20 1115  BP: 110/62 107/63 121/71 113/86  Pulse: 68 71 91 (!) 51  Resp: 18 18 18 16   Temp: 97.7 F (36.5 C) 98 F (36.7 C) 97.6 F (36.4 C) 97.6 F (36.4 C)  TempSrc: Oral  Oral Oral  SpO2: 94% 95% 96% 96%  Weight:      Height:  Intake/Output Summary (Last 24 hours) at 09/10/2020 1359 Last data filed at 09/10/2020 1021 Gross per 24 hour  Intake 2137.37 ml  Output 3 ml  Net 2134.37 ml   Filed Weights   09/09/20 1039 09/09/20 1706 09/09/20 2151  Weight: 82.6 kg 82.6 kg 82.6 kg    Exam: General: NAD  Cardiovascular: S1, S2 present Respiratory: CTAB Abdomen: Soft, nontender, nondistended, bowel sounds present Musculoskeletal: No bilateral pedal edema  noted, R forearm dressing c/d/i Skin: Normal Psychiatry: Normal mood     Data Reviewed: CBC: Recent Labs  Lab 09/09/20 1043 09/10/20 0408  WBC 13.3* 7.9  NEUTROABS 10.4*  --   HGB 14.9 13.7  HCT 43.5 40.2  MCV 97.1 98.0  PLT 311 622   Basic Metabolic Panel: Recent Labs  Lab 09/09/20 1043 09/10/20 0408  NA 131* 135  K 3.7 4.6  CL 101 103  CO2 19* 27  GLUCOSE 225* 145*  BUN 19 13  CREATININE 1.19 0.68  CALCIUM 9.1 8.8*   GFR: Estimated Creatinine Clearance: 98.2 mL/min (by C-G formula based on SCr of 0.68 mg/dL). Liver Function Tests: Recent Labs  Lab 09/09/20 1043  AST 17  ALT 16  ALKPHOS 56  BILITOT 1.2  PROT 7.6  ALBUMIN 3.7   No results for input(s): LIPASE, AMYLASE in the last 168 hours. No results for input(s): AMMONIA in the last 168 hours. Coagulation Profile: Recent Labs  Lab 09/09/20 1146  INR 1.2   Cardiac Enzymes: No results for input(s): CKTOTAL, CKMB, CKMBINDEX, TROPONINI in the last 168 hours. BNP (last 3 results) No results for input(s): PROBNP in the last 8760 hours. HbA1C: Recent Labs    09/09/20 2031  HGBA1C 5.6   CBG: Recent Labs  Lab 09/09/20 1656 09/09/20 1904 09/09/20 2051 09/10/20 0734 09/10/20 1112  GLUCAP 99 88 112* 141* 153*   Lipid Profile: No results for input(s): CHOL, HDL, LDLCALC, TRIG, CHOLHDL, LDLDIRECT in the last 72 hours. Thyroid Function Tests: No results for input(s): TSH, T4TOTAL, FREET4, T3FREE, THYROIDAB in the last 72 hours. Anemia Panel: No results for input(s): VITAMINB12, FOLATE, FERRITIN, TIBC, IRON, RETICCTPCT in the last 72 hours. Urine analysis: No results found for: COLORURINE, APPEARANCEUR, LABSPEC, PHURINE, GLUCOSEU, HGBUR, BILIRUBINUR, KETONESUR, PROTEINUR, UROBILINOGEN, NITRITE, LEUKOCYTESUR Sepsis Labs: @LABRCNTIP (procalcitonin:4,lacticidven:4)  ) Recent Results (from the past 240 hour(s))  Resp Panel by RT-PCR (Flu A&B, Covid) Nasopharyngeal Swab     Status: None   Collection  Time: 09/09/20 11:46 AM   Specimen: Nasopharyngeal Swab; Nasopharyngeal(NP) swabs in vial transport medium  Result Value Ref Range Status   SARS Coronavirus 2 by RT PCR NEGATIVE NEGATIVE Final    Comment: (NOTE) SARS-CoV-2 target nucleic acids are NOT DETECTED.  The SARS-CoV-2 RNA is generally detectable in upper respiratory specimens during the acute phase of infection. The lowest concentration of SARS-CoV-2 viral copies this assay can detect is 138 copies/mL. A negative result does not preclude SARS-Cov-2 infection and should not be used as the sole basis for treatment or other patient management decisions. A negative result may occur with  improper specimen collection/handling, submission of specimen other than nasopharyngeal swab, presence of viral mutation(s) within the areas targeted by this assay, and inadequate number of viral copies(<138 copies/mL). A negative result must be combined with clinical observations, patient history, and epidemiological information. The expected result is Negative.  Fact Sheet for Patients:  EntrepreneurPulse.com.au  Fact Sheet for Healthcare Providers:  IncredibleEmployment.be  This test is no t yet approved or cleared by the [place]  States FDA and  has been authorized for detection and/or diagnosis of SARS-CoV-2 by FDA under an Emergency Use Authorization (EUA). This EUA will remain  in effect (meaning this test can be used) for the duration of the COVID-19 declaration under Section 564(b)(1) of the Act, 21 U.S.C.section 360bbb-3(b)(1), unless the authorization is terminated  or revoked sooner.       Influenza A by PCR NEGATIVE NEGATIVE Final   Influenza B by PCR NEGATIVE NEGATIVE Final    Comment: (NOTE) The Xpert Xpress SARS-CoV-2/FLU/RSV plus assay is intended as an aid in the diagnosis of influenza from Nasopharyngeal swab specimens and should not be used as a sole basis for treatment. Nasal washings  and aspirates are unacceptable for Xpert Xpress SARS-CoV-2/FLU/RSV testing.  Fact Sheet for Patients: EntrepreneurPulse.com.au  Fact Sheet for Healthcare Providers: IncredibleEmployment.be  This test is not yet approved or cleared by the [place] FDA and has been authorized for detection and/or diagnosis of SARS-CoV-2 by FDA under an Emergency Use Authorization (EUA). This EUA will remain in effect (meaning this test can be used) for the duration of the COVID-19 declaration under Section 564(b)(1) of the Act, 21 U.S.C. section 360bbb-3(b)(1), unless the authorization is terminated or revoked.  Performed at [facility], [address] [place], [place] [address]   Blood Culture (routine x 2)     Status: None (Preliminary result)   Collection Time: 09/09/20 11:46 AM   Specimen: BLOOD  Result Value Ref Range Status   Specimen Description BLOOD LEFT ANTECUBITAL  Final   Special Requests   Final    BOTTLES DRAWN AEROBIC AND ANAEROBIC Blood Culture adequate volume   Culture   Final    NO GROWTH < 24 HOURS Performed at [facility], [address] [place], [place] [address]    Report Status PENDING  Incomplete  Blood Culture (routine x 2)     Status: None (Preliminary result)   Collection Time: 09/09/20 11:46 AM   Specimen: BLOOD  Result Value Ref Range Status   Specimen Description BLOOD BLOOD LEFT FOREARM  Final   Special Requests   Final    BOTTLES DRAWN AEROBIC AND ANAEROBIC Blood Culture results may not be optimal due to an inadequate volume of blood received in culture bottles   Culture   Final    NO GROWTH < 24 HOURS Performed at [facility], [address] [place], [place] [address]    Report Status PENDING  Incomplete  Aerobic/Anaerobic Culture w Gram Stain (surgical/deep wound)     Status: None (Preliminary result)   Collection Time: 09/09/20  6:30 PM   Specimen: Abscess  Result Value Ref  Range Status   Specimen Description   Final    ABSCESS RIGHT FOREARM Performed at [facility], [place] [address] [place], [place] [address]    Special Requests   Final    NONE Performed at [facility], [place], [place] [address]    Gram Stain   Final    MODERATE WBC PRESENT, PREDOMINANTLY PMN MODERATE GRAM POSITIVE COCCI IN PAIRS IN CLUSTERS ABUNDANT GRAM NEGATIVE RODS    Culture   Final    NO GROWTH < 24 HOURS Performed at [facility], [place] [address] [place], [place] [address]    Report Status PENDING  Incomplete      Studies: No results found.  Scheduled Meds:  arformoterol  15 mcg Nebulization BID   And   umeclidinium bromide  1 puff Inhalation Daily   budesonide (PULMICORT) nebulizer solution  0.5 mg Nebulization BID   ferrous sulfate  325 mg Oral Daily   insulin aspart  0-5 Units Subcutaneous QHS   insulin aspart  0-9 Units Subcutaneous TID WC   nicotine  21 mg Transdermal Daily    Continuous Infusions:  ampicillin-sulbactam (UNASYN) IV 3 g (09/10/20 9597)   lactated ringers 125 mL/hr at 09/10/20 0816   vancomycin 1,250 mg (09/10/20 1116)     LOS: 1 day     Alma Friendly, MD [facility]  If 7PM-7AM, please contact night-coverage [email or web address] 09/10/2020, 1:59 PM

## 2020-09-10 NOTE — Plan of Care (Signed)
  Problem: Clinical Measurements: Goal: Ability to maintain clinical measurements within normal limits will improve Outcome: Progressing Goal: Will remain free from infection Outcome: Progressing Goal: Diagnostic test results will improve Outcome: Progressing Goal: Respiratory complications will improve Outcome: Progressing Goal: Cardiovascular complication will be avoided Outcome: Progressing   Problem: Pain Managment: Goal: General experience of comfort will improve Outcome: Progressing   Pt is involved in and agrees with the plan for care. V/S stable. Complained of pain on his R arm; morphine IV given with relief. Kerlex dressing changed.

## 2020-09-10 NOTE — Progress Notes (Signed)
Patient ID: Jimmy Bishop, male   DOB: 11/29/1960, 60 y.o.   MRN: [id number]     [place] Hospital Day(s): 1.   Post op day(s): 1 Day Post-Op.   Interval History: Patient seen and examined, no acute events or new complaints overnight. Patient reports feeling better.  He complains of pain in the right forearm.  Pain improved compared to yesterday.  Pain localized to the right forearm.  No pain radiation.  Aggravating factor is dressing changes.  There is no alleviating factors.   Vital signs in last 24 hours: [min-max] current  Temp:  [97 F (36.1 C)-98.2 F (36.8 C)] 97.6 F (36.4 C) (06/22 1115) Pulse Rate:  [51-103] 51 (06/22 1115) Resp:  [11-28] 16 (06/22 1115) BP: (91-121)/(58-96) 113/86 (06/22 1115) SpO2:  [91 %-100 %] 96 % (06/22 1115) Weight:  [82.6 kg] 82.6 kg (06/21 2151)     Height: 5' 9.02" (175.3 cm) Weight: 82.6 kg BMI (Calculated): 26.88   Physical Exam:  Constitutional: alert, cooperative and no distress  Extremity: Right forearm wound with healthy muscle tissue exposed.  No necrotic tissue.  No purulence.  Labs:  CBC Latest Ref Rng & Units 09/10/2020 09/09/2020 12/01/2019  WBC 4.0 - 10.5 K/uL 7.9 13.3(H) 5.9  Hemoglobin 13.0 - 17.0 g/dL 13.7 14.9 17.2(H)  Hematocrit 39.0 - 52.0 % 40.2 43.5 52.7(H)  Platelets 150 - 400 K/uL 290 311 222   CMP Latest Ref Rng & Units 09/10/2020 09/09/2020 12/01/2019  Glucose 70 - 99 mg/dL 145(H) 225(H) 100(H)  BUN 6 - 20 mg/dL 13 19 11   Creatinine 0.61 - 1.24 mg/dL 0.68 1.19 1.02  Sodium 135 - 145 mmol/L 135 131(L) 147(H)  Potassium 3.5 - 5.1 mmol/L 4.6 3.7 4.1  Chloride 98 - 111 mmol/L 103 101 109  CO2 22 - 32 mmol/L 27 19(L) 25  Calcium 8.9 - 10.3 mg/dL 8.8(L) 9.1 8.8(L)  Total Protein 6.5 - 8.1 g/dL - 7.6 -  Total Bilirubin 0.3 - 1.2 mg/dL - 1.2 -  Alkaline Phos 38 - 126 U/L - 56 -  AST 15 - 41 U/L - 17 -  ALT 0 - 44 U/L - 16 -    Imaging studies: No new pertinent imaging studies   Assessment/Plan:  60  y.o. male with right forearm necrotizing fasciitis and abscess 1 Day Post-Op s/p cleansing and debridement, complicated by pertinent comorbidities including diabetes and hypertension.  Patient significant improvement.  The wound looks healthy.  White blood cell count decreased to 7.  This is good to be a very difficult wound for the patient to take care by himself.  I initially discussed the case with case managers due to patient insurance he will be difficult to coordinate home health for wound care.  I will try to see if a wound VAC can be approved even if I am the one taking care of it in the office.  Cultures are pending.  Transition to oral antibiotic therapy as per hospitalist.  We will continue to follow daily.   Arnold Long, MD

## 2020-09-10 NOTE — TOC Initial Note (Addendum)
Transition of Care [facility]) - Initial/Assessment Note    Patient Details  Name: Jimmy Bishop MRN: [id number] Date of Birth: March 11, 1961  [facility] CM/SW Contact:    Beverly Sessions, RN Phone Number: 09/10/2020, 2:49 PM  Clinical Narrative:                  Patient admitted from home with cellulitis due to dog bite.  Discussed case with MD.  Patient will require daily wet to dry wound care, or wound vac placement.  I have notified that due to patients insurance it would be a barrier to obtain home health services.  Per MD patient could come in to the office twice a week for wound care  Disposition discussed with patient.  Patient lives at home with brother and mother. PCP [facility].   [facility] issues [facility] States that his mother or brother provide transportation   Discussed  options with patient. Patient states that his nephew would be able to learn the wound care. He is to call family to see when nephew is available to come to the hospital for wound care educations.  Notified bedside RN and requested that she follow up with patient when nephew would be available.   MD updated   [facility], [facility], [facility], [facility], [facility], and [facility] unable to accept patient for home health  Expected Discharge Plan: Home/Self Care Barriers to Discharge: Continued Medical Work up   Patient Goals and CMS Choice        Expected Discharge Plan and Services Expected Discharge Plan: Home/Self Care                                              Prior Living Arrangements/Services   Lives with:: Relatives Patient language and need for interpreter reviewed:: Yes Do you feel safe going back to the place where you live?: Yes      Need for Family Participation in Patient Care: Yes (Comment) Care giver support system in place?: Yes (comment)   Criminal Activity/Legal Involvement Pertinent to Current  Situation/Hospitalization: No - Comment as needed  Activities of Daily Living Home Assistive Devices/Equipment: None ADL Screening (condition at time of admission) Patient's cognitive ability adequate to safely complete daily activities?: Yes Is the patient deaf or have difficulty hearing?: No Does the patient have difficulty seeing, even when wearing glasses/contacts?: No Does the patient have difficulty concentrating, remembering, or making decisions?: No Patient able to express need for assistance with ADLs?: Yes Does the patient have difficulty dressing or bathing?: No Independently performs ADLs?: Yes (appropriate for developmental age) Does the patient have difficulty walking or climbing stairs?: No Weakness of Legs: None Weakness of Arms/Hands: None  Permission Sought/Granted                  Emotional Assessment       Orientation: : Oriented to Self, Oriented to Place, Oriented to  Time, Oriented to Situation Alcohol / Substance Use: Not Applicable Psych Involvement: No (comment)  Admission diagnosis:  Right forearm cellulitis [L03.113] Dog bite of right forearm, initial encounter [I14.431V, W54.0XXA] Sepsis without acute organ dysfunction, due to unspecified organism [facility]) [A41.9] Patient Active Problem List   Diagnosis Date Noted   Right forearm cellulitis 09/09/2020   Dog bite 09/09/2020   COPD (chronic obstructive pulmonary disease) ([place]) 09/09/2020  Abscess of right forearm 09/09/2020   Hypertension    Severe sepsis ([place])    HLD (hyperlipidemia)    Tobacco abuse    Diabetes mellitus without complication ([place])    S/P cervical spinal fusion 04/18/2015   PCP:  [place] [facility]   [id number] [place] [place] - [address] [place] [address] Phone: [phone] Fax: [phone]     Social Determinants of Health (SDOH) Interventions    Readmission Risk Interventions No  flowsheet data found.

## 2020-09-10 NOTE — Consult Note (Signed)
Pharmacy Antibiotic Note  Jimmy Bishop is a 60 y.o. male admitted on 09/09/2020 with right arm cellulitis afer dog bite. Pharmacy has been consulted for Unasyn and vancomcyin dosing. He is now 1 Day Post-Op s/p cleansing and debridement  Plan:  1) adjust vancomycin dose to 1250mg  IV every 12 hours Goal AUC 400-550 Expected AUC: 489 SCr used: 0.80 mg/dL (rounded up) T1/2: 8.1 h, Ke: 0.086 h-1 Css (calculated): 32.7 / 13.3 mcg/mL Daily renal function assessment while on IV vancomycin  2) continue Unasyn 3 grams IV every 6 hours  Height: 5' 9.02" (175.3 cm) Weight: 82.6 kg (182 lb 1.6 oz) IBW/kg (Calculated) : 70.74  Temp (24hrs), Avg:97.7 F (36.5 C), Min:97 F (36.1 C), Max:98.2 F (36.8 C)  Recent Labs  Lab 09/09/20 1043 09/09/20 1146 09/10/20 0408  WBC 13.3*  --  7.9  CREATININE 1.19  --  0.68  LATICACIDVEN 2.4* 1.7  --      Estimated Creatinine Clearance: 98.2 mL/min (by C-G formula based on SCr of 0.68 mg/dL).    Allergies  Allergen Reactions   Vicodin [Hydrocodone-Acetaminophen] Hives and Rash    Antimicrobials this admission: Unasyn 6/21 >> Vancomycin 6/21 >>  Microbiology results: 6/21 BCx: NG < 24 h 6/21 WCx: mod GPC in pairs/clusters, GNR 6/21 SARS CoV-2: negative 6/21 influenza A/B: negative  Thank you for allowing pharmacy to be a part of this patient's care.  Dallie Piles, PharmD, BCPS Clinical Pharmacist 09/10/2020 7:50 AM

## 2020-09-11 DIAGNOSIS — L03113 Cellulitis of right upper limb: Secondary | ICD-10-CM | POA: Diagnosis not present

## 2020-09-11 DIAGNOSIS — I10 Essential (primary) hypertension: Secondary | ICD-10-CM | POA: Diagnosis not present

## 2020-09-11 DIAGNOSIS — A419 Sepsis, unspecified organism: Secondary | ICD-10-CM | POA: Diagnosis not present

## 2020-09-11 DIAGNOSIS — W540XXA Bitten by dog, initial encounter: Secondary | ICD-10-CM | POA: Diagnosis not present

## 2020-09-11 DIAGNOSIS — L02413 Cutaneous abscess of right upper limb: Secondary | ICD-10-CM

## 2020-09-11 DIAGNOSIS — R6520 Severe sepsis without septic shock: Secondary | ICD-10-CM | POA: Diagnosis not present

## 2020-09-11 DIAGNOSIS — Z720 Tobacco use: Secondary | ICD-10-CM

## 2020-09-11 DIAGNOSIS — E119 Type 2 diabetes mellitus without complications: Secondary | ICD-10-CM | POA: Diagnosis not present

## 2020-09-11 LAB — CBC WITH DIFFERENTIAL/PLATELET
Abs Immature Granulocytes: 0.06 10*3/uL (ref 0.00–0.07)
Basophils Absolute: 0 10*3/uL (ref 0.0–0.1)
Basophils Relative: 0 %
Eosinophils Absolute: 0 10*3/uL (ref 0.0–0.5)
Eosinophils Relative: 0 %
HCT: 39.8 % (ref 39.0–52.0)
Hemoglobin: 13.4 g/dL (ref 13.0–17.0)
Immature Granulocytes: 1 %
Lymphocytes Relative: 11 %
Lymphs Abs: 1.1 10*3/uL (ref 0.7–4.0)
MCH: 32.8 pg (ref 26.0–34.0)
MCHC: 33.7 g/dL (ref 30.0–36.0)
MCV: 97.3 fL (ref 80.0–100.0)
Monocytes Absolute: 0.9 10*3/uL (ref 0.1–1.0)
Monocytes Relative: 9 %
Neutro Abs: 7.8 10*3/uL — ABNORMAL HIGH (ref 1.7–7.7)
Neutrophils Relative %: 79 %
Platelets: 307 10*3/uL (ref 150–400)
RBC: 4.09 MIL/uL — ABNORMAL LOW (ref 4.22–5.81)
RDW: 12.3 % (ref 11.5–15.5)
WBC: 9.9 10*3/uL (ref 4.0–10.5)
nRBC: 0 % (ref 0.0–0.2)

## 2020-09-11 LAB — BASIC METABOLIC PANEL WITH GFR
Anion gap: 6 (ref 5–15)
BUN: 15 mg/dL (ref 6–20)
CO2: 28 mmol/L (ref 22–32)
Calcium: 9.2 mg/dL (ref 8.9–10.3)
Chloride: 104 mmol/L (ref 98–111)
Creatinine, Ser: 0.64 mg/dL (ref 0.61–1.24)
GFR, Estimated: >60 mL/min
Glucose, Bld: 112 mg/dL — ABNORMAL HIGH (ref 70–99)
Potassium: 4 mmol/L (ref 3.5–5.1)
Sodium: 138 mmol/L (ref 135–145)

## 2020-09-11 LAB — GLUCOSE, CAPILLARY
Glucose-Capillary: 117 mg/dL — ABNORMAL HIGH (ref 70–99)
Glucose-Capillary: 120 mg/dL — ABNORMAL HIGH (ref 70–99)
Glucose-Capillary: 118 mg/dL — ABNORMAL HIGH (ref 70–99)
Glucose-Capillary: 115 mg/dL — ABNORMAL HIGH (ref 70–99)

## 2020-09-11 MED ORDER — SODIUM CHLORIDE 0.9 % IV SOLN: INTRAVENOUS | Status: DC | PRN

## 2020-09-11 MED ORDER — MORPHINE SULFATE (PF) 2 MG/ML IV SOLN: 2.0000 mg | INTRAVENOUS | Status: DC | PRN

## 2020-09-11 NOTE — Discharge Instructions (Signed)
Wound Care: Daily local care: Remove the dressing (gauze) with saline. After the gauze if removed cleanse the wound with the saline and then apply a moist gauze in the wound. Apply a dry gauze on top and then cover/wrap the wound.

## 2020-09-11 NOTE — Progress Notes (Signed)
Patient's nephew will not be available to the dressing change education today and has decided he will not be able to the dressing changes going forward. Pe the patient's Mother, her niece, Corinne Ports [phone] can assist the patient with dressing changes, but she is not able to physically come to the hospital for a demonstration. She will only available by phone. Dr. Windell Moment notified of change.   Fuller Mandril, RN

## 2020-09-11 NOTE — Progress Notes (Signed)
PROGRESS NOTE  MCDANIEL OHMS RKY:[id number] DOB: [birth year] DOA: 09/09/2020 PCP: [place]  HPI/Recap of past 24 hours: HPI from Dr Terie Purser is a 60 y.o. male with medical history significant of HTN, HLD, DM, COPD, tobacco abuse, anemia, who presents with right arm pain after dog bite. Pt states that he was bitten by a stray dog, then developed pain and swelling in right forearm, which has been progressively worsening.  He also developed redness and pus drainage from the 2 bite sites. Patient was given Augmentin prescription, but has not started taking this medication yet. In the ED, pt was initially hypotensive with a blood pressure 89/59, which improved to 118/76 after giving 2.5 L LR bolus, HR 104, WBC 13.3, lactic acid of 2.4, negative COVID PCR, renal function stable.  Chest x-ray negative.  Patient admitted for further management. Gen surgery Dr Peyton Najjar consulted, pt is s/p debridement of R forearm on 09/09/20.    Patient denies any new complaints, denies worsening right forearm pain, fever/chills.   Assessment/Plan: Principal Problem:   Right forearm cellulitis Active Problems:   Dog bite   Hypertension   Severe sepsis (HCC)   HLD (hyperlipidemia)   Tobacco abuse   Diabetes mellitus without complication (HCC)   COPD (chronic obstructive pulmonary disease) (HCC)   Abscess of right forearm  Severe sepsis due to right forearm necrotizing fasciitis s/p debridement and drainage of deep muscular abscess on 09/09/2020 Severe sepsis with leukocytosis, tachycardia, LA 2.4, initially hypotensive, responded to IVF Currently afebrile, with no leukocytosis BC x2 NGTD General surgery consulted, s/p debridement on 09/09/2020 Surgical cultures pending, gram stain showing moderate gram-positive cocci in pairs, abundant gram-negative rods Continue IV Unasyn, vancomycin pending final culture Discontinue IVF   Dog bite Patient was given rabies immunoglobulin and rabies  vaccine in ED   Hypertension Continue to hold all blood pressure medications due to soft BP   Tobacco abuse Advised to quit Nicotine patch   Diabetes mellitus without complication Last B1D was 5.6 on 09/09/2020 Patient taking metformin at home SSI, accuchecks, hypoglycemic protocol   COPD Stable Bronchodilators   Estimated body mass index is 26.88 kg/m as calculated from the following:   Height as of this encounter: 5' 9.02" (1.753 m).   Weight as of this encounter: 82.6 kg.     Code Status: Full  Family Communication: None at bedside  Disposition Plan: Status is: Inpatient  Remains inpatient appropriate because:Inpatient level of care appropriate due to severity of illness  Dispo: The patient is from: Home              Anticipated d/c is to: Home              Patient currently is not medically stable to d/c.   Difficult to place patient No    Consultants: Gen surg  Procedures: debridement and drainage of deep muscular abscess on 09/09/2020  Antimicrobials: Vancomycin Unasyn  DVT prophylaxis: lovenox   Objective: Vitals:   09/10/20 2053 09/11/20 0518 09/11/20 0740 09/11/20 1135  BP:  127/72  (!) 126/97  Pulse:  71  72  Resp:  16  18  Temp:  97.7 F (36.5 C)  (!) 97.5 F (36.4 C)  TempSrc:  Oral  Oral  SpO2: 95% 96% 95% 94%  Weight:      Height:        Intake/Output Summary (Last 24 hours) at 09/11/2020 1524 Last data filed at 09/11/2020 1300 Gross per 24 hour  Intake 2538.18 ml  Output 1225 ml  Net 1313.18 ml   Filed Weights   09/09/20 1039 09/09/20 1706 09/09/20 2151  Weight: 82.6 kg 82.6 kg 82.6 kg    Exam: General: NAD  Cardiovascular: S1, S2 present Respiratory: CTAB Abdomen: Soft, nontender, nondistended, bowel sounds present Musculoskeletal: No bilateral pedal edema noted, R forearm dressing c/d/i Skin: Normal Psychiatry: Normal mood     Data Reviewed: CBC: Recent Labs  Lab 09/09/20 1043 09/10/20 0408 09/11/20 0431   WBC 13.3* 7.9 9.9  NEUTROABS 10.4*  --  7.8*  HGB 14.9 13.7 13.4  HCT 43.5 40.2 39.8  MCV 97.1 98.0 97.3  PLT 311 290 628   Basic Metabolic Panel: Recent Labs  Lab 09/09/20 1043 09/10/20 0408 09/11/20 0431  NA 131* 135 138  K 3.7 4.6 4.0  CL 101 103 104  CO2 19* 27 28  GLUCOSE 225* 145* 112*  BUN 19 13 15   CREATININE 1.19 0.68 0.64  CALCIUM 9.1 8.8* 9.2   GFR: Estimated Creatinine Clearance: 98.2 mL/min (by C-G formula based on SCr of 0.64 mg/dL). Liver Function Tests: Recent Labs  Lab 09/09/20 1043  AST 17  ALT 16  ALKPHOS 56  BILITOT 1.2  PROT 7.6  ALBUMIN 3.7   No results for input(s): LIPASE, AMYLASE in the last 168 hours. No results for input(s): AMMONIA in the last 168 hours. Coagulation Profile: Recent Labs  Lab 09/09/20 1146  INR 1.2   Cardiac Enzymes: No results for input(s): CKTOTAL, CKMB, CKMBINDEX, TROPONINI in the last 168 hours. BNP (last 3 results) No results for input(s): PROBNP in the last 8760 hours. HbA1C: Recent Labs    09/09/20 2031  HGBA1C 5.6   CBG: Recent Labs  Lab 09/10/20 1112 09/10/20 1615 09/10/20 2150 09/11/20 0750 09/11/20 1133  GLUCAP 153* 146* 117* 117* 120*   Lipid Profile: No results for input(s): CHOL, HDL, LDLCALC, TRIG, CHOLHDL, LDLDIRECT in the last 72 hours. Thyroid Function Tests: No results for input(s): TSH, T4TOTAL, FREET4, T3FREE, THYROIDAB in the last 72 hours. Anemia Panel: No results for input(s): VITAMINB12, FOLATE, FERRITIN, TIBC, IRON, RETICCTPCT in the last 72 hours. Urine analysis: No results found for: COLORURINE, APPEARANCEUR, LABSPEC, PHURINE, GLUCOSEU, HGBUR, BILIRUBINUR, KETONESUR, PROTEINUR, UROBILINOGEN, NITRITE, LEUKOCYTESUR Sepsis Labs: @LABRCNTIP (procalcitonin:4,lacticidven:4)  ) Recent Results (from the past 240 hour(s))  Resp Panel by RT-PCR (Flu A&B, Covid) Nasopharyngeal Swab     Status: None   Collection Time: 09/09/20 11:46 AM   Specimen: Nasopharyngeal Swab;  Nasopharyngeal(NP) swabs in vial transport medium  Result Value Ref Range Status   SARS Coronavirus 2 by RT PCR NEGATIVE NEGATIVE Final    Comment: (NOTE) SARS-CoV-2 target nucleic acids are NOT DETECTED.  The SARS-CoV-2 RNA is generally detectable in upper respiratory specimens during the acute phase of infection. The lowest concentration of SARS-CoV-2 viral copies this assay can detect is 138 copies/mL. A negative result does not preclude SARS-Cov-2 infection and should not be used as the sole basis for treatment or other patient management decisions. A negative result may occur with  improper specimen collection/handling, submission of specimen other than nasopharyngeal swab, presence of viral mutation(s) within the areas targeted by this assay, and inadequate number of viral copies(<138 copies/mL). A negative result must be combined with clinical observations, patient history, and epidemiological information. The expected result is Negative.  Fact Sheet for Patients:  EntrepreneurPulse.com.au  Fact Sheet for Healthcare Providers:  IncredibleEmployment.be  This test is no t yet approved or cleared by the [place]  FDA and  has been authorized for detection and/or diagnosis of SARS-CoV-2 by FDA under an Emergency Use Authorization (EUA). This EUA will remain  in effect (meaning this test can be used) for the duration of the COVID-19 declaration under Section 564(b)(1) of the Act, 21 U.S.C.section 360bbb-3(b)(1), unless the authorization is terminated  or revoked sooner.       Influenza A by PCR NEGATIVE NEGATIVE Final   Influenza B by PCR NEGATIVE NEGATIVE Final    Comment: (NOTE) The Xpert Xpress SARS-CoV-2/FLU/RSV plus assay is intended as an aid in the diagnosis of influenza from Nasopharyngeal swab specimens and should not be used as a sole basis for treatment. Nasal washings and aspirates are unacceptable for Xpert Xpress  SARS-CoV-2/FLU/RSV testing.  Fact Sheet for Patients: EntrepreneurPulse.com.au  Fact Sheet for Healthcare Providers: IncredibleEmployment.be  This test is not yet approved or cleared by the [place] FDA and has been authorized for detection and/or diagnosis of SARS-CoV-2 by FDA under an Emergency Use Authorization (EUA). This EUA will remain in effect (meaning this test can be used) for the duration of the COVID-19 declaration under Section 564(b)(1) of the Act, 21 U.S.C. section 360bbb-3(b)(1), unless the authorization is terminated or revoked.  Performed at [facility], [address] [place] [place], [place] [address]   Blood Culture (routine x 2)     Status: None (Preliminary result)   Collection Time: 09/09/20 11:46 AM   Specimen: BLOOD  Result Value Ref Range Status   Specimen Description BLOOD LEFT ANTECUBITAL  Final   Special Requests   Final    BOTTLES DRAWN AEROBIC AND ANAEROBIC Blood Culture adequate volume   Culture   Final    NO GROWTH 2 DAYS Performed at [facility], [address] [place], [place] [address]    Report Status PENDING  Incomplete  Blood Culture (routine x 2)     Status: None (Preliminary result)   Collection Time: 09/09/20 11:46 AM   Specimen: BLOOD  Result Value Ref Range Status   Specimen Description BLOOD BLOOD LEFT FOREARM  Final   Special Requests   Final    BOTTLES DRAWN AEROBIC AND ANAEROBIC Blood Culture results may not be optimal due to an inadequate volume of blood received in culture bottles   Culture   Final    NO GROWTH 2 DAYS Performed at [facility], [address] [place], [place] [address]    Report Status PENDING  Incomplete  Aerobic/Anaerobic Culture w Gram Stain (surgical/deep wound)     Status: None (Preliminary result)   Collection Time: 09/09/20  6:30 PM   Specimen: Abscess  Result Value Ref Range Status   Specimen Description   Final    ABSCESS  RIGHT FOREARM Performed at [facility], [place] [address], [place], [place] [address]    Special Requests   Final    NONE Performed at [facility], [place], [place] [address]    Gram Stain   Final    MODERATE WBC PRESENT, PREDOMINANTLY PMN MODERATE GRAM POSITIVE COCCI IN PAIRS IN CLUSTERS ABUNDANT GRAM NEGATIVE RODS    Culture   Final    CULTURE REINCUBATED FOR BETTER GROWTH Performed at [facility], [place] [address] [place], [place] [address]    Report Status PENDING  Incomplete      Studies: No results found.  Scheduled Meds:  arformoterol  15 mcg Nebulization BID   And   umeclidinium bromide  1 puff  Inhalation Daily   budesonide (PULMICORT) nebulizer solution  0.5 mg Nebulization BID   COVID-19 mRNA vaccine (Moderna)  0.5 mL Intramuscular ONCE-1600   enoxaparin (LOVENOX) injection  40 mg Subcutaneous Q24H   ferrous sulfate  325 mg Oral Daily   insulin aspart  0-5 Units Subcutaneous QHS   insulin aspart  0-9 Units Subcutaneous TID WC   nicotine  21 mg Transdermal Daily    Continuous Infusions:  sodium chloride     ampicillin-sulbactam (UNASYN) IV 3 g (09/11/20 1505)   vancomycin 1,250 mg (09/11/20 1231)     LOS: 2 days     Alma Friendly, MD [facility]  If 7PM-7AM, please contact night-coverage [email or web address] 09/11/2020, 3:24 PM

## 2020-09-11 NOTE — Progress Notes (Signed)
Chaplain Maggie made initial visit with patient at bedside. Patient expressed he has pain in his right arm due to wound and swelling of his hand. Prayer was offered for relief and healing. Chaplain also made room for storytelling and listening with empathy.

## 2020-09-11 NOTE — TOC Progression Note (Signed)
Transition of Care [facility]) - Progression Note    Patient Details  Name: DECKARD STUBER MRN: [id number] Date of Birth: [birth year]  Transition of Care [facility]) CM/SW Contact  Beverly Sessions, RN Phone Number: 09/11/2020, 1:47 PM  Clinical Narrative:     Alanson Puls to come to bedside today, and surgeon to provide wound care education   Expected Discharge Plan: Home/Self Care Barriers to Discharge: Continued Medical Work up  Expected Discharge Plan and Services Expected Discharge Plan: Home/Self Care                                               Social Determinants of Health (SDOH) Interventions    Readmission Risk Interventions No flowsheet data found.

## 2020-09-11 NOTE — Progress Notes (Signed)
Patient ID: Jimmy Bishop, male   DOB: 09-15-1960, 60 y.o.   MRN: [id number]     [place] Hospital Day(s): 2.   Post op day(s): 2 Days Post-Op.   Interval History: Patient seen and examined, no acute events or new complaints overnight. Patient reports feeling well.  He reports that he has been feeling better.  He denies significant pain.  No pain radiation.  Alleviating factor was oxycodone.  Aggravating factor is dressing changes.  No fever.  Vital signs in last 24 hours: [min-max] current  Temp:  [97.6 F (36.4 C)-98 F (36.7 C)] 97.7 F (36.5 C) (06/23 0518) Pulse Rate:  [51-88] 71 (06/23 0518) Resp:  [16-20] 16 (06/23 0518) BP: (105-127)/(64-86) 127/72 (06/23 0518) SpO2:  [95 %-96 %] 95 % (06/23 0740)     Height: 5' 9.02" (175.3 cm) Weight: 82.6 kg BMI (Calculated): 26.88   Physical Exam:  Constitutional: alert, cooperative and no distress  Extremity: Right forearm wound with healthy muscle tissue and fat tissue.  No purulence.  No necrotic tissue.  Improved cellulitis.  Labs:  CBC Latest Ref Rng & Units 09/11/2020 09/10/2020 09/09/2020  WBC 4.0 - 10.5 K/uL 9.9 7.9 13.3(H)  Hemoglobin 13.0 - 17.0 g/dL 13.4 13.7 14.9  Hematocrit 39.0 - 52.0 % 39.8 40.2 43.5  Platelets 150 - 400 K/uL 307 290 311   CMP Latest Ref Rng & Units 09/11/2020 09/10/2020 09/09/2020  Glucose 70 - 99 mg/dL 112(H) 145(H) 225(H)  BUN 6 - 20 mg/dL 15 13 19   Creatinine 0.61 - 1.24 mg/dL 0.64 0.68 1.19  Sodium 135 - 145 mmol/L 138 135 131(L)  Potassium 3.5 - 5.1 mmol/L 4.0 4.6 3.7  Chloride 98 - 111 mmol/L 104 103 101  CO2 22 - 32 mmol/L 28 27 19(L)  Calcium 8.9 - 10.3 mg/dL 9.2 8.8(L) 9.1  Total Protein 6.5 - 8.1 g/dL - - 7.6  Total Bilirubin 0.3 - 1.2 mg/dL - - 1.2  Alkaline Phos 38 - 126 U/L - - 56  AST 15 - 41 U/L - - 17  ALT 0 - 44 U/L - - 16    Imaging studies: No new pertinent imaging studies   Assessment/Plan:  60 y.o. male with right forearm necrotizing fasciitis 2 Days  Post-Op s/p cleansing and debridement, complicated by pertinent comorbidities including diabetes and hypertension.  Patient continue to improve clinically.  Wounds healing well.  I personally did the dressing change today.  No sign of deeper abscesses.  No necrotic tissue.  White blood cell is still within normal limits.  If patient has support for assistant with dressing changes there will be no contraindication for discharge if there is oral therapy for his antibiotic therapy.   Arnold Long, MD

## 2020-09-12 ENCOUNTER — Encounter: Payer: Self-pay | Admitting: *Deleted

## 2020-09-12 ENCOUNTER — Other Ambulatory Visit: Payer: Self-pay | Admitting: *Deleted

## 2020-09-12 DIAGNOSIS — I10 Essential (primary) hypertension: Secondary | ICD-10-CM | POA: Diagnosis not present

## 2020-09-12 DIAGNOSIS — L03113 Cellulitis of right upper limb: Secondary | ICD-10-CM | POA: Diagnosis not present

## 2020-09-12 DIAGNOSIS — R6520 Severe sepsis without septic shock: Secondary | ICD-10-CM | POA: Diagnosis not present

## 2020-09-12 DIAGNOSIS — E119 Type 2 diabetes mellitus without complications: Secondary | ICD-10-CM | POA: Diagnosis not present

## 2020-09-12 DIAGNOSIS — L02413 Cutaneous abscess of right upper limb: Secondary | ICD-10-CM | POA: Diagnosis not present

## 2020-09-12 DIAGNOSIS — A419 Sepsis, unspecified organism: Secondary | ICD-10-CM | POA: Diagnosis not present

## 2020-09-12 DIAGNOSIS — W540XXA Bitten by dog, initial encounter: Secondary | ICD-10-CM | POA: Diagnosis not present

## 2020-09-12 LAB — BASIC METABOLIC PANEL
Anion gap: 7 (ref 5–15)
BUN: 16 mg/dL (ref 6–20)
CO2: 26 mmol/L (ref 22–32)
Calcium: 9.6 mg/dL (ref 8.9–10.3)
Chloride: 105 mmol/L (ref 98–111)
Creatinine, Ser: 0.65 mg/dL (ref 0.61–1.24)
GFR, Estimated: >60 mL/min (ref >60)
Glucose, Bld: 86 mg/dL (ref 70–99)
Potassium: 4 mmol/L (ref 3.5–5.1)
Sodium: 138 mmol/L (ref 135–145)

## 2020-09-12 LAB — GLUCOSE, CAPILLARY
Glucose-Capillary: 77 mg/dL (ref 70–99)
Glucose-Capillary: 108 mg/dL — ABNORMAL HIGH (ref 70–99)
Glucose-Capillary: 83 mg/dL (ref 70–99)
Glucose-Capillary: 94 mg/dL (ref 70–99)
Glucose-Capillary: 93 mg/dL (ref 70–99)

## 2020-09-12 LAB — CBC WITH DIFFERENTIAL/PLATELET
Abs Immature Granulocytes: 0.03 10*3/uL (ref 0.00–0.07)
Basophils Absolute: 0 10*3/uL (ref 0.0–0.1)
Basophils Relative: 1 %
Eosinophils Absolute: 0 10*3/uL (ref 0.0–0.5)
Eosinophils Relative: 1 %
HCT: 41.1 % (ref 39.0–52.0)
Hemoglobin: 13.8 g/dL (ref 13.0–17.0)
Immature Granulocytes: 1 %
Lymphocytes Relative: 33 %
Lymphs Abs: 2.1 10*3/uL (ref 0.7–4.0)
MCH: 32.9 pg (ref 26.0–34.0)
MCHC: 33.6 g/dL (ref 30.0–36.0)
MCV: 98.1 fL (ref 80.0–100.0)
Monocytes Absolute: 0.7 10*3/uL (ref 0.1–1.0)
Monocytes Relative: 10 %
Neutro Abs: 3.5 10*3/uL (ref 1.7–7.7)
Neutrophils Relative %: 54 %
Platelets: 330 10*3/uL (ref 150–400)
RBC: 4.19 MIL/uL — ABNORMAL LOW (ref 4.22–5.81)
RDW: 12.6 % (ref 11.5–15.5)
WBC: 6.4 10*3/uL (ref 4.0–10.5)
nRBC: 0 % (ref 0.0–0.2)

## 2020-09-12 MED ORDER — LISINOPRIL 10 MG PO TABS
10.0000 mg | ORAL_TABLET | Freq: Every day | ORAL | Status: DC
  Administered 2020-09-12 – 2020-09-14 (×3): 10 mg via ORAL
  Filled 2020-09-12 (×3): qty 1

## 2020-09-12 MED ORDER — BISACODYL 5 MG PO TBEC
5.0000 mg | DELAYED_RELEASE_TABLET | Freq: Every day | ORAL | Status: DC
  Administered 2020-09-12 – 2020-09-13 (×2): 5 mg via ORAL
  Filled 2020-09-12 (×3): qty 1

## 2020-09-12 NOTE — Patient Outreach (Signed)
  Medicaid Managed Care   Nurse Care Manager Note  09/12/2020 Name:  Jimmy Bishop MRN:  [id number] DOB:  July 06, 1960  Jimmy Bishop is an 60 y.o. year old male who is a primary patient of [place].  The [facility] Managed Care Coordination team was consulted for assistance with:    Post d/c wound care needs.  Mr. Butt mother/dpr Jimmy Bishop [phone] was given information about [facility] Managed Care Coordination team services today. Jimmy Bishop's mother/dpr Jimmy Bishop [phone] agreed to services and verbal consent obtained.  Engaged with patient's mother/DPR by telephone for  consultation via [facility] Medicaid health plan representative  in response to [facility] health plan referral for case management and/or care coordination services.   Assessments/Interventions:  Review of past medical history, allergies, medications, health status, including review of consultants reports, laboratory and other test data, was performed as part of comprehensive evaluation and provision of chronic care management services.  SDOH (Social Determinants of Health) assessments and interventions performed:  SDOH Interventions    Flowsheet Row Most Recent Value  SDOH Interventions   Social Connections Interventions Other (Comment)  [collaboration with mother/dpr and niece re: post-hospital d/c needs,  mother indicates patient has limited assistance at [place]  Allergies  Allergen Reactions   Vicodin [Hydrocodone-Acetaminophen] Hives and Rash    Patient Active Problem List   Diagnosis Date Noted   Right forearm cellulitis 09/09/2020   Dog bite 09/09/2020   COPD (chronic obstructive pulmonary disease) ([place]) 09/09/2020   Abscess of right forearm 09/09/2020   Hypertension    Severe sepsis ([place])    HLD (hyperlipidemia)    Tobacco abuse    Diabetes mellitus without complication ([place])    S/P cervical spinal fusion 04/18/2015    Conditions to  be addressed/monitored per PCP order:   Wound care needs  See Clinical Care Plan  Follow Up:  Patient's mother/DPR agrees to Care Plan and Follow-up.  Plan: The Managed Medicaid care management team will reach out to the patient again over the next 7 days.  Date/time of next scheduled RN care management/care coordination outreach:  TBD post d/c  Janalyn Shy [place] Management Coordinator  High Risk Managed Medicaid Direct Dial:  [phone]  Fax: [phone]

## 2020-09-12 NOTE — Progress Notes (Signed)
Patient ID: Jimmy Bishop, male   DOB: Feb 05, 1961, 60 y.o.   MRN: [id number]     [place] Hospital Day(s): 3.   Post op day(s): 3 Days Post-Op.   Interval History: Patient seen and examined, no acute events or new complaints overnight. Patient reports feeling well.  He reports localized pain.  No pain radiation.  No issues with the dressing.  Vital signs in last 24 hours: [min-max] current  Temp:  [97.6 F (36.4 C)-97.8 F (36.6 C)] 97.6 F (36.4 C) (06/24 1531) Pulse Rate:  [68-76] 76 (06/24 1531) Resp:  [16-18] 16 (06/24 1531) BP: (128-149)/(60-102) 134/81 (06/24 1531) SpO2:  [94 %-99 %] 98 % (06/24 1531)     Height: 5' 9.02" (175.3 cm) Weight: 82.6 kg BMI (Calculated): 26.88   Physical Exam:  Constitutional: alert, cooperative and no distress  Extremity: Warm with healthy muscle and fat tissue.  No purulence or ischemia.  Labs:  CBC Latest Ref Rng & Units 09/12/2020 09/11/2020 09/10/2020  WBC 4.0 - 10.5 K/uL 6.4 9.9 7.9  Hemoglobin 13.0 - 17.0 g/dL 13.8 13.4 13.7  Hematocrit 39.0 - 52.0 % 41.1 39.8 40.2  Platelets 150 - 400 K/uL 330 307 290   CMP Latest Ref Rng & Units 09/12/2020 09/11/2020 09/10/2020  Glucose 70 - 99 mg/dL 86 112(H) 145(H)  BUN 6 - 20 mg/dL 16 15 13   Creatinine 0.61 - 1.24 mg/dL 0.65 0.64 0.68  Sodium 135 - 145 mmol/L 138 138 135  Potassium 3.5 - 5.1 mmol/L 4.0 4.0 4.6  Chloride 98 - 111 mmol/L 105 104 103  CO2 22 - 32 mmol/L 26 28 27   Calcium 8.9 - 10.3 mg/dL 9.6 9.2 8.8(L)  Total Protein 6.5 - 8.1 g/dL - - -  Total Bilirubin 0.3 - 1.2 mg/dL - - -  Alkaline Phos 38 - 126 U/L - - -  AST 15 - 41 U/L - - -  ALT 0 - 44 U/L - - -    Imaging studies: No new pertinent imaging studies   Assessment/Plan:  60 y.o. male with right forearm necrotizing fasciitis 3 Days Post-Op s/p cleansing and debridement, complicated by pertinent comorbidities including diabetes and hypertension.  Patient continues to improve clinically.  No clinical  deterioration of the wound.  I did a wet-to-dry dressing.  Patient does not have social support for wound care at home.  Also does not have a good insurance support.  They have not approved the best treatment for the patient that is a wound VAC system.  Since the patient does not help at home for complex dressing changes his best alternative is to get negative pressure dressing that can be changed in my office 2 times per week.  If not the patient will need to stay in the hospital for adequate local care to avoid complication of the infection or deterioration of the wound.  As soon as the wound VAC system is approved I will applied in the wound.  Arnold Long, MD

## 2020-09-12 NOTE — Progress Notes (Signed)
Mobility Specialist - Progress Note   09/12/20 1500  Mobility  Activity Ambulated in hall;Transferred:  Bed to chair  Level of Assistance Modified independent, requires aide device or extra time  Assistive Device None  Distance Ambulated (ft) 320 ft  Mobility Ambulated independently in hallway  Mobility Response Tolerated well  Mobility performed by Mobility specialist  $Mobility charge 1 Mobility    Pt ambulated in hallway without AD. ModI. Mild pain in RUE, requiring assistance to don socks while EOB. Dizzy upon sitting. Denied SOB on RA. No LOB. Pt ambulated to bathroom prior to return to recliner.    Jimmy Bishop Mobility Specialist 09/12/20, 3:30 PM

## 2020-09-12 NOTE — TOC Transition Note (Addendum)
Transition of Care [facility]) - CM/SW Discharge Note   Patient Details  Name: Jimmy Bishop MRN: [id number] Date of Birth: Jun 03, 1960  Transition of Care [facility]) CM/SW Contact:  Magnus Ivan, LCSW Phone Number: 09/12/2020, 11:45 AM   Clinical Narrative:   Patient will discharge home today with wound vac. Wound vac referral made to [employer] to be delivered to bedside. Patient will go into  Surgeon's Office 2x/week for vac changes, he has confirmed this with Attending MD and Surgeon. No other TOC needs identified prior to DC.  2:25- Asked Rhonda with [employer] to meet MD to get order form and deliver wound vac.  3:55- Asked for update on wound vac delivery to bedside. Per Suanne Marker, they are waiting on [employer]'s intake team to get everything arranged on their end, Suanne Marker is on standby to deliver the vac when this is complete. Notified RN and MD.  4:35- Per Suanne Marker, the wound vac will not be delivered to bedside today due to insurance authorization issues. They have escalated it to [place], Suanne Marker and Thedore Mins with [employer] are aware that patient is medically ready and should DC tomorrow.    Final next level of care: Home/Self Care Barriers to Discharge: Barriers Resolved   Patient Goals and CMS Choice Patient states their goals for this hospitalization and ongoing recovery are:: home CMS Medicare.gov Compare Post Acute Care list provided to:: Patient Choice offered to / list presented to : Patient  Discharge Placement                    Patient and family notified of of transfer: 09/12/20  Discharge Plan and Services                DME Arranged: Vac DME Agency: [facility] Date DME Agency Contacted: 09/12/20   Representative spoke with at DME Agency: Nash Shearer, Karena Addison            Social Determinants of Health ([place]) Interventions     Readmission Risk Interventions No flowsheet data found.

## 2020-09-12 NOTE — TOC Progression Note (Addendum)
Transition of Care [facility]) - Progression Note    Patient Details  Name: Jimmy Bishop MRN: [id number] Date of Birth: 04-21-1960  Transition [facility] Care [facility]) CM/SW [place], LCSW Phone Number: 09/12/2020, 9:24 AM  Clinical Narrative:   Per RN, nephew unable to do wound care but niece able to do, but not able to come in for wound care training. Asked medical team if they feel comfortable with wound care training being done virtually, or if not if we need to order wound vac and patient go into MD office 2x/week for changes. Will follow up. Confirmed with [employer] that Medicaid does cover wound vac. Also received VM from [place] stating she has spoken with patient's mother and they will follow for Case Management.  Left VM for niece Verdis Frederickson requesting return call.  10:10- Spoke to [place]. She states she can provide wound care once per day in patient's home. She states there is no way she can come to the hospital or talk via phone/video call for wound care training today or tomorrow. Updated MDs.  Expected Discharge Plan: Home/Self Care Barriers to Discharge: Continued Medical Work up  Expected Discharge Plan and Services Expected Discharge Plan: Home/Self Care                                               Social Determinants of Health (SDOH) Interventions    Readmission Risk Interventions No flowsheet data found.

## 2020-09-12 NOTE — Consult Note (Signed)
Pharmacy Antibiotic Note  Jimmy Bishop is a 60 y.o. male admitted on 09/09/2020 with right arm cellulitis afer dog bite. Pharmacy has been consulted for Unasyn and vancomcyin dosing. He is now 3 days Post-Op s/p cleansing and debridement  Plan:  1) continue vancomycin dose to 1250mg  IV every 12 hours Goal AUC 400-550 Expected AUC: 489 SCr used: 0.80 mg/dL (rounded up) T1/2: 8.1 h, Ke: 0.086 h-1 Css (calculated): 32.7 / 13.3 mcg/mL Daily renal function assessment while on IV vancomycin  2) continue Unasyn 3 grams IV every 6 hours  Height: 5' 9.02" (175.3 cm) Weight: 82.6 kg (182 lb 1.6 oz) IBW/kg (Calculated) : 70.74  Temp (24hrs), Avg:97.7 F (36.5 C), Min:97.5 F (36.4 C), Max:97.8 F (36.6 C)  Recent Labs  Lab 09/09/20 1043 09/09/20 1146 09/10/20 0408 09/11/20 0431 09/12/20 0545  WBC 13.3*  --  7.9 9.9 6.4  CREATININE 1.19  --  0.68 0.64 0.65  LATICACIDVEN 2.4* 1.7  --   --   --      Estimated Creatinine Clearance: 98.2 mL/min (by C-G formula based on SCr of 0.65 mg/dL).    Allergies  Allergen Reactions   Vicodin [Hydrocodone-Acetaminophen] Hives and Rash    Antimicrobials this admission: Unasyn 6/21 >> Vancomycin 6/21 >>  Microbiology results: 6/21 BCx: NG 2 days 6/21 WCx: mod GPC in pairs/clusters, GNR, Ab S constellatus 6/21 SARS CoV-2: negative 6/21 influenza A/B: negative  Thank you for allowing pharmacy to be a part of this patient's care.  Dallie Piles, PharmD, BCPS Clinical Pharmacist 09/12/2020 7:06 AM

## 2020-09-12 NOTE — Progress Notes (Signed)
PROGRESS NOTE  Jimmy Bishop PNT:[id number] DOB: 1961/02/18 DOA: 09/09/2020 PCP: [place]  HPI/Recap of past 24 hours: HPI from Dr Terie Purser is a 60 y.o. male with medical history significant of HTN, HLD, DM, COPD, tobacco abuse, anemia, who presents with right arm pain after dog bite. Pt states that he was bitten by a stray dog, then developed pain and swelling in right forearm, which has been progressively worsening.  He also developed redness and pus drainage from the 2 bite sites. Patient was given Augmentin prescription, but has not started taking this medication yet. In the ED, pt was initially hypotensive with a blood pressure 89/59, which improved to 118/76 after giving 2.5 L LR bolus, HR 104, WBC 13.3, lactic acid of 2.4, negative COVID PCR, renal function stable.  Chest x-ray negative.  Patient admitted for further management. Gen surgery Dr Peyton Najjar consulted, pt is s/p debridement of R forearm on 09/09/20.    Patient denies any new complaints   Assessment/Plan: Principal Problem:   Right forearm cellulitis Active Problems:   Dog bite   Hypertension   Severe sepsis (HCC)   HLD (hyperlipidemia)   Tobacco abuse   Diabetes mellitus without complication (HCC)   COPD (chronic obstructive pulmonary disease) (HCC)   Abscess of right forearm  Severe sepsis due to right forearm necrotizing fasciitis s/p debridement and drainage of deep muscular abscess on 09/09/2020 Severe sepsis with leukocytosis, tachycardia, LA 2.4, initially hypotensive, responded to IVF Currently afebrile, with no leukocytosis BC x2 NGTD General surgery consulted, s/p debridement on 09/09/2020 Surgical cultures pending final report, gram stain showing moderate gram-positive cocci in pairs, abundant gram-negative rods Continue IV Unasyn pending final culture, DC vancomycin Discontinue IVF   Dog bite Patient was given rabies immunoglobulin and rabies vaccine in ED    Hypertension Restart lisinopril   Tobacco abuse Advised to quit Nicotine patch   Diabetes mellitus without complication Last G8Q was 5.6 on 09/09/2020 Patient taking metformin at home SSI, accuchecks, hypoglycemic protocol   COPD Stable Bronchodilators   Estimated body mass index is 26.88 kg/m as calculated from the following:   Height as of this encounter: 5' 9.02" (1.753 m).   Weight as of this encounter: 82.6 kg.     Code Status: Full  Family Communication: None at bedside  Disposition Plan: Status is: Inpatient  Remains inpatient appropriate because:Inpatient level of care appropriate due to severity of illness  Dispo: The patient is from: Home              Anticipated d/c is to: Home              Patient currently is not medically stable to d/c.   Difficult to place patient No    Consultants: Gen surg  Procedures: debridement and drainage of deep muscular abscess on 09/09/2020  Antimicrobials: Unasyn  DVT prophylaxis: lovenox   Objective: Vitals:   09/12/20 0737 09/12/20 0816 09/12/20 0907 09/12/20 1531  BP:  (!) 149/78 128/83 134/81  Pulse:  72 71 76  Resp:  16  16  Temp:  97.8 F (36.6 C)  97.6 F (36.4 C)  TempSrc:  Oral  Oral  SpO2: 99% 94%  98%  Weight:      Height:        Intake/Output Summary (Last 24 hours) at 09/12/2020 1631 Last data filed at 09/12/2020 1622 Gross per 24 hour  Intake 1111.34 ml  Output 4700 ml  Net -3588.66 ml  Filed Weights   09/09/20 1039 09/09/20 1706 09/09/20 2151  Weight: 82.6 kg 82.6 kg 82.6 kg    Exam: General: NAD  Cardiovascular: S1, S2 present Respiratory: CTAB Abdomen: Soft, nontender, nondistended, bowel sounds present Musculoskeletal: No bilateral pedal edema noted, R forearm dressing c/d/i Skin: As noted above Psychiatry: Normal mood     Data Reviewed: CBC: Recent Labs  Lab 09/09/20 1043 09/10/20 0408 09/11/20 0431 09/12/20 0545  WBC 13.3* 7.9 9.9 6.4  NEUTROABS 10.4*  --   7.8* 3.5  HGB 14.9 13.7 13.4 13.8  HCT 43.5 40.2 39.8 41.1  MCV 97.1 98.0 97.3 98.1  PLT 311 290 307 932   Basic Metabolic Panel: Recent Labs  Lab 09/09/20 1043 09/10/20 0408 09/11/20 0431 09/12/20 0545  NA 131* 135 138 138  K 3.7 4.6 4.0 4.0  CL 101 103 104 105  CO2 19* 27 28 26   GLUCOSE 225* 145* 112* 86  BUN 19 13 15 16   CREATININE 1.19 0.68 0.64 0.65  CALCIUM 9.1 8.8* 9.2 9.6   GFR: Estimated Creatinine Clearance: 98.2 mL/min (by C-G formula based on SCr of 0.65 mg/dL). Liver Function Tests: Recent Labs  Lab 09/09/20 1043  AST 17  ALT 16  ALKPHOS 56  BILITOT 1.2  PROT 7.6  ALBUMIN 3.7   No results for input(s): LIPASE, AMYLASE in the last 168 hours. No results for input(s): AMMONIA in the last 168 hours. Coagulation Profile: Recent Labs  Lab 09/09/20 1146  INR 1.2   Cardiac Enzymes: No results for input(s): CKTOTAL, CKMB, CKMBINDEX, TROPONINI in the last 168 hours. BNP (last 3 results) No results for input(s): PROBNP in the last 8760 hours. HbA1C: Recent Labs    09/09/20 2031  HGBA1C 5.6   CBG: Recent Labs  Lab 09/11/20 1710 09/11/20 2141 09/12/20 0814 09/12/20 0843 09/12/20 1148  GLUCAP 118* 115* 77 108* 83   Lipid Profile: No results for input(s): CHOL, HDL, LDLCALC, TRIG, CHOLHDL, LDLDIRECT in the last 72 hours. Thyroid Function Tests: No results for input(s): TSH, T4TOTAL, FREET4, T3FREE, THYROIDAB in the last 72 hours. Anemia Panel: No results for input(s): VITAMINB12, FOLATE, FERRITIN, TIBC, IRON, RETICCTPCT in the last 72 hours. Urine analysis: No results found for: COLORURINE, APPEARANCEUR, LABSPEC, PHURINE, GLUCOSEU, HGBUR, BILIRUBINUR, KETONESUR, PROTEINUR, UROBILINOGEN, NITRITE, LEUKOCYTESUR Sepsis Labs: @LABRCNTIP (procalcitonin:4,lacticidven:4)  ) Recent Results (from the past 240 hour(s))  Resp Panel by RT-PCR (Flu A&B, Covid) Nasopharyngeal Swab     Status: None   Collection Time: 09/09/20 11:46 AM   Specimen:  Nasopharyngeal Swab; Nasopharyngeal(NP) swabs in vial transport medium  Result Value Ref Range Status   SARS Coronavirus 2 by RT PCR NEGATIVE NEGATIVE Final    Comment: (NOTE) SARS-CoV-2 target nucleic acids are NOT DETECTED.  The SARS-CoV-2 RNA is generally detectable in upper respiratory specimens during the acute phase of infection. The lowest concentration of SARS-CoV-2 viral copies this assay can detect is 138 copies/mL. A negative result does not preclude SARS-Cov-2 infection and should not be used as the sole basis for treatment or other patient management decisions. A negative result may occur with  improper specimen collection/handling, submission of specimen other than nasopharyngeal swab, presence of viral mutation(s) within the areas targeted by this assay, and inadequate number of viral copies(<138 copies/mL). A negative result must be combined with clinical observations, patient history, and epidemiological information. The expected result is Negative.  Fact Sheet for Patients:  EntrepreneurPulse.com.au  Fact Sheet for Healthcare Providers:  IncredibleEmployment.be  This test is no t yet  approved or cleared by the [place] and  has been authorized for detection and/or diagnosis of SARS-CoV-2 by FDA under an Emergency Use Authorization (EUA). This EUA will remain  in effect (meaning this test can be used) for the duration of the COVID-19 declaration under Section 564(b)(1) of the Act, 21 U.S.C.section 360bbb-3(b)(1), unless the authorization is terminated  or revoked sooner.       Influenza A by PCR NEGATIVE NEGATIVE Final   Influenza B by PCR NEGATIVE NEGATIVE Final    Comment: (NOTE) The Xpert Xpress SARS-CoV-2/FLU/RSV plus assay is intended as an aid in the diagnosis of influenza from Nasopharyngeal swab specimens and should not be used as a sole basis for treatment. Nasal washings and aspirates are unacceptable for  Xpert Xpress SARS-CoV-2/FLU/RSV testing.  Fact Sheet for Patients: EntrepreneurPulse.com.au  Fact Sheet for Healthcare Providers: IncredibleEmployment.be  This test is not yet approved or cleared by the [place] FDA and has been authorized for detection and/or diagnosis of SARS-CoV-2 by FDA under an Emergency Use Authorization (EUA). This EUA will remain in effect (meaning this test can be used) for the duration of the COVID-19 declaration under Section 564(b)(1) of the Act, 21 U.S.C. section 360bbb-3(b)(1), unless the authorization is terminated or revoked.  Performed at [facility], [address] [place], [place] [address]   Blood Culture (routine x 2)     Status: None (Preliminary result)   Collection Time: 09/09/20 11:46 AM   Specimen: BLOOD  Result Value Ref Range Status   Specimen Description BLOOD LEFT ANTECUBITAL  Final   Special Requests   Final    BOTTLES DRAWN AEROBIC AND ANAEROBIC Blood Culture adequate volume   Culture   Final    NO GROWTH 2 DAYS Performed at [facility], [address] [place], [place] [address]    Report Status PENDING  Incomplete  Blood Culture (routine x 2)     Status: None (Preliminary result)   Collection Time: 09/09/20 11:46 AM   Specimen: BLOOD  Result Value Ref Range Status   Specimen Description BLOOD BLOOD LEFT FOREARM  Final   Special Requests   Final    BOTTLES DRAWN AEROBIC AND ANAEROBIC Blood Culture results may not be optimal due to an inadequate volume of blood received in culture bottles   Culture   Final    NO GROWTH 2 DAYS Performed at [facility], [address], [place], [place] [address]    Report Status PENDING  Incomplete  Aerobic/Anaerobic Culture w Gram Stain (surgical/deep wound)     Status: None (Preliminary result)   Collection Time: 09/09/20  6:30 PM   Specimen: Abscess  Result Value Ref Range Status   Specimen Description   Final     ABSCESS RIGHT FOREARM Performed at [facility], [place] [address] [place], [place] [address]    Special Requests   Final    NONE Performed at [facility], [address] [place], [place] [address]    Gram Stain   Final    MODERATE WBC PRESENT, PREDOMINANTLY PMN MODERATE GRAM POSITIVE COCCI IN PAIRS IN CLUSTERS ABUNDANT GRAM NEGATIVE RODS    Culture   Final    ABUNDANT STREPTOCOCCUS CONSTELLATUS SUSCEPTIBILITIES TO FOLLOW HOLDING FOR POSSIBLE ANAEROBE Performed at [facility], [place] [address], [place], [place] [address]    Report Status PENDING  Incomplete      Studies: No results found.  Scheduled Meds:  arformoterol  15 mcg  Nebulization BID   And   umeclidinium bromide  1 puff Inhalation Daily   budesonide (PULMICORT) nebulizer solution  0.5 mg Nebulization BID   enoxaparin (LOVENOX) injection  40 mg Subcutaneous Q24H   ferrous sulfate  325 mg Oral Daily   insulin aspart  0-5 Units Subcutaneous QHS   insulin aspart  0-9 Units Subcutaneous TID WC   nicotine  21 mg Transdermal Daily    Continuous Infusions:  sodium chloride     ampicillin-sulbactam (UNASYN) IV 3 g (09/12/20 1622)   vancomycin 1,250 mg (09/12/20 1245)     LOS: 3 days     Alma Friendly, MD [facility]  If 7PM-7AM, please contact night-coverage [email or web address] 09/12/2020, 4:31 PM

## 2020-09-13 DIAGNOSIS — L02413 Cutaneous abscess of right upper limb: Secondary | ICD-10-CM | POA: Diagnosis not present

## 2020-09-13 DIAGNOSIS — I10 Essential (primary) hypertension: Secondary | ICD-10-CM | POA: Diagnosis not present

## 2020-09-13 DIAGNOSIS — E119 Type 2 diabetes mellitus without complications: Secondary | ICD-10-CM | POA: Diagnosis not present

## 2020-09-13 DIAGNOSIS — A419 Sepsis, unspecified organism: Secondary | ICD-10-CM | POA: Diagnosis not present

## 2020-09-13 DIAGNOSIS — W540XXA Bitten by dog, initial encounter: Secondary | ICD-10-CM | POA: Diagnosis not present

## 2020-09-13 DIAGNOSIS — R6520 Severe sepsis without septic shock: Secondary | ICD-10-CM | POA: Diagnosis not present

## 2020-09-13 DIAGNOSIS — T8189XA Other complications of procedures, not elsewhere classified, initial encounter: Secondary | ICD-10-CM | POA: Diagnosis not present

## 2020-09-13 DIAGNOSIS — L03113 Cellulitis of right upper limb: Secondary | ICD-10-CM | POA: Diagnosis not present

## 2020-09-13 LAB — CREATININE, SERUM
Creatinine, Ser: 0.69 mg/dL (ref 0.61–1.24)
GFR, Estimated: >60 mL/min (ref >60)

## 2020-09-13 LAB — GLUCOSE, CAPILLARY
Glucose-Capillary: 58 mg/dL — ABNORMAL LOW (ref 70–99)
Glucose-Capillary: 138 mg/dL — ABNORMAL HIGH (ref 70–99)
Glucose-Capillary: 81 mg/dL (ref 70–99)
Glucose-Capillary: 142 mg/dL — ABNORMAL HIGH (ref 70–99)

## 2020-09-13 NOTE — Progress Notes (Signed)
PROGRESS NOTE  Jimmy Bishop ZOX:[id number] DOB: February 23, 1961 DOA: 09/09/2020 PCP: [place]  HPI/Recap of past 24 hours: HPI from Dr Terie Purser is a 60 y.o. male with medical history significant of HTN, HLD, DM, COPD, tobacco abuse, anemia, who presents with right arm pain after dog bite. Pt states that he was bitten by a stray dog, then developed pain and swelling in right forearm, which has been progressively worsening.  He also developed redness and pus drainage from the 2 bite sites. Patient was given Augmentin prescription, but has not started taking this medication yet. In the ED, pt was initially hypotensive with a blood pressure 89/59, which improved to 118/76 after giving 2.5 L LR bolus, HR 104, WBC 13.3, lactic acid of 2.4, negative COVID PCR, renal function stable.  Chest x-ray negative.  Patient admitted for further management. Gen surgery Dr Peyton Najjar consulted, pt is s/p debridement of R forearm on 09/09/20.    Patient denies any complaints.   Assessment/Plan: Principal Problem:   Right forearm cellulitis Active Problems:   Dog bite   Hypertension   Severe sepsis (HCC)   HLD (hyperlipidemia)   Tobacco abuse   Diabetes mellitus without complication (HCC)   COPD (chronic obstructive pulmonary disease) (HCC)   Abscess of right forearm  Severe sepsis due to right forearm necrotizing fasciitis s/p debridement and drainage of deep muscular abscess on 09/09/2020 Severe sepsis with leukocytosis, tachycardia, LA 2.4, initially hypotensive, responded to IVF Currently afebrile, with no leukocytosis BC x2 NGTD General surgery consulted, s/p debridement on 09/09/2020 Surgical cultures pending final report Gram stain showing moderate gram-positive cocci in pairs, abundant gram-negative rods Continue IV Unasyn pending final culture, DC vancomycin Discontinue IVF   Dog bite Patient was given rabies immunoglobulin and rabies vaccine in ED    Hypertension Restart lisinopril   Tobacco abuse Advised to quit Nicotine patch   Diabetes mellitus without complication Last W1X was 5.6 on 09/09/2020 Patient taking metformin at home SSI, accuchecks, hypoglycemic protocol   COPD Stable Bronchodilators   Estimated body mass index is 26.88 kg/m as calculated from the following:   Height as of this encounter: 5' 9.02" (1.753 m).   Weight as of this encounter: 82.6 kg.     Code Status: Full  Family Communication: None at bedside  Disposition Plan: Status is: Inpatient  Remains inpatient appropriate because:Inpatient level of care appropriate due to severity of illness  Dispo: The patient is from: Home              Anticipated d/c is to: Home              Patient currently is not medically stable to d/c.   Difficult to place patient No    Consultants: Gen surg  Procedures: debridement and drainage of deep muscular abscess on 09/09/2020  Antimicrobials: Unasyn  DVT prophylaxis: lovenox   Objective: Vitals:   09/13/20 0426 09/13/20 0743 09/13/20 0800 09/13/20 1135  BP: 125/78 (!) 155/83  115/85  Pulse: 70 78  69  Resp: 14 18  16   Temp: (!) 97.4 F (36.3 C) 97.7 F (36.5 C)  97.8 F (36.6 C)  TempSrc: Oral   Oral  SpO2: 94%  96% 96%  Weight:      Height:        Intake/Output Summary (Last 24 hours) at 09/13/2020 1536 Last data filed at 09/13/2020 1403 Gross per 24 hour  Intake 1570 ml  Output 1500 ml  Net 70  ml   Filed Weights   09/09/20 1039 09/09/20 1706 09/09/20 2151  Weight: 82.6 kg 82.6 kg 82.6 kg    Exam: General: NAD  Cardiovascular: S1, S2 present Respiratory: CTAB Abdomen: Soft, nontender, nondistended, bowel sounds present Musculoskeletal: No bilateral pedal edema noted, R forearm dressing c/d/i Skin: As noted above Psychiatry: Normal mood     Data Reviewed: CBC: Recent Labs  Lab 09/09/20 1043 09/10/20 0408 09/11/20 0431 09/12/20 0545  WBC 13.3* 7.9 9.9 6.4  NEUTROABS  10.4*  --  7.8* 3.5  HGB 14.9 13.7 13.4 13.8  HCT 43.5 40.2 39.8 41.1  MCV 97.1 98.0 97.3 98.1  PLT 311 290 307 196   Basic Metabolic Panel: Recent Labs  Lab 09/09/20 1043 09/10/20 0408 09/11/20 0431 09/12/20 0545 09/13/20 0606  NA 131* 135 138 138  --   K 3.7 4.6 4.0 4.0  --   CL 101 103 104 105  --   CO2 19* 27 28 26   --   GLUCOSE 225* 145* 112* 86  --   BUN 19 13 15 16   --   CREATININE 1.19 0.68 0.64 0.65 0.69  CALCIUM 9.1 8.8* 9.2 9.6  --    GFR: Estimated Creatinine Clearance: 98.2 mL/min (by C-G formula based on SCr of 0.69 mg/dL). Liver Function Tests: Recent Labs  Lab 09/09/20 1043  AST 17  ALT 16  ALKPHOS 56  BILITOT 1.2  PROT 7.6  ALBUMIN 3.7   No results for input(s): LIPASE, AMYLASE in the last 168 hours. No results for input(s): AMMONIA in the last 168 hours. Coagulation Profile: Recent Labs  Lab 09/09/20 1146  INR 1.2   Cardiac Enzymes: No results for input(s): CKTOTAL, CKMB, CKMBINDEX, TROPONINI in the last 168 hours. BNP (last 3 results) No results for input(s): PROBNP in the last 8760 hours. HbA1C: No results for input(s): HGBA1C in the last 72 hours.  CBG: Recent Labs  Lab 09/12/20 1148 09/12/20 1641 09/12/20 2039 09/13/20 0745 09/13/20 1136  GLUCAP 83 94 93 58* 138*   Lipid Profile: No results for input(s): CHOL, HDL, LDLCALC, TRIG, CHOLHDL, LDLDIRECT in the last 72 hours. Thyroid Function Tests: No results for input(s): TSH, T4TOTAL, FREET4, T3FREE, THYROIDAB in the last 72 hours. Anemia Panel: No results for input(s): VITAMINB12, FOLATE, FERRITIN, TIBC, IRON, RETICCTPCT in the last 72 hours. Urine analysis: No results found for: COLORURINE, APPEARANCEUR, LABSPEC, PHURINE, GLUCOSEU, HGBUR, BILIRUBINUR, KETONESUR, PROTEINUR, UROBILINOGEN, NITRITE, LEUKOCYTESUR Sepsis Labs: @LABRCNTIP (procalcitonin:4,lacticidven:4)  ) Recent Results (from the past 240 hour(s))  Resp Panel by RT-PCR (Flu A&B, Covid) Nasopharyngeal Swab      Status: None   Collection Time: 09/09/20 11:46 AM   Specimen: Nasopharyngeal Swab; Nasopharyngeal(NP) swabs in vial transport medium  Result Value Ref Range Status   SARS Coronavirus 2 by RT PCR NEGATIVE NEGATIVE Final    Comment: (NOTE) SARS-CoV-2 target nucleic acids are NOT DETECTED.  The SARS-CoV-2 RNA is generally detectable in upper respiratory specimens during the acute phase of infection. The lowest concentration of SARS-CoV-2 viral copies this assay can detect is 138 copies/mL. A negative result does not preclude SARS-Cov-2 infection and should not be used as the sole basis for treatment or other patient management decisions. A negative result may occur with  improper specimen collection/handling, submission of specimen other than nasopharyngeal swab, presence of viral mutation(s) within the areas targeted by this assay, and inadequate number of viral copies(<138 copies/mL). A negative result must be combined with clinical observations, patient history, and epidemiological information. The  expected result is Negative.  Fact Sheet for Patients:  EntrepreneurPulse.com.au  Fact Sheet for Healthcare Providers:  IncredibleEmployment.be  This test is no t yet approved or cleared by the [place] FDA and  has been authorized for detection and/or diagnosis of SARS-CoV-2 by FDA under an Emergency Use Authorization (EUA). This EUA will remain  in effect (meaning this test can be used) for the duration of the COVID-19 declaration under Section 564(b)(1) of the Act, 21 U.S.C.section 360bbb-3(b)(1), unless the authorization is terminated  or revoked sooner.       Influenza A by PCR NEGATIVE NEGATIVE Final   Influenza B by PCR NEGATIVE NEGATIVE Final    Comment: (NOTE) The Xpert Xpress SARS-CoV-2/FLU/RSV plus assay is intended as an aid in the diagnosis of influenza from Nasopharyngeal swab specimens and should not be used as a sole basis  for treatment. Nasal washings and aspirates are unacceptable for Xpert Xpress SARS-CoV-2/FLU/RSV testing.  Fact Sheet for Patients: EntrepreneurPulse.com.au  Fact Sheet for Healthcare Providers: IncredibleEmployment.be  This test is not yet approved or cleared by the [place] FDA and has been authorized for detection and/or diagnosis of SARS-CoV-2 by FDA under an Emergency Use Authorization (EUA). This EUA will remain in effect (meaning this test can be used) for the duration of the COVID-19 declaration under Section 564(b)(1) of the Act, 21 U.S.C. section 360bbb-3(b)(1), unless the authorization is terminated or revoked.  Performed at [facility], [address] [place], [place] [address]   Blood Culture (routine x 2)     Status: None (Preliminary result)   Collection Time: 09/09/20 11:46 AM   Specimen: BLOOD  Result Value Ref Range Status   Specimen Description BLOOD LEFT ANTECUBITAL  Final   Special Requests   Final    BOTTLES DRAWN AEROBIC AND ANAEROBIC Blood Culture adequate volume   Culture   Final    NO GROWTH 4 DAYS Performed at [facility], [address], [place], [place] [address]    Report Status PENDING  Incomplete  Blood Culture (routine x 2)     Status: None (Preliminary result)   Collection Time: 09/09/20 11:46 AM   Specimen: BLOOD  Result Value Ref Range Status   Specimen Description BLOOD BLOOD LEFT FOREARM  Final   Special Requests   Final    BOTTLES DRAWN AEROBIC AND ANAEROBIC Blood Culture results may not be optimal due to an inadequate volume of blood received in culture bottles   Culture   Final    NO GROWTH 4 DAYS Performed at [facility], [address], [place], [place] [address]    Report Status PENDING  Incomplete  Aerobic/Anaerobic Culture w Gram Stain (surgical/deep wound)     Status: None (Preliminary result)   Collection Time: 09/09/20  6:30 PM   Specimen: Abscess   Result Value Ref Range Status   Specimen Description   Final    ABSCESS RIGHT FOREARM Performed at [facility], [place] [address], [place], [place] [address]    Special Requests   Final    NONE Performed at [facility], [address] [place],  [address]    Gram Stain   Final    MODERATE WBC PRESENT, PREDOMINANTLY PMN MODERATE GRAM POSITIVE COCCI IN PAIRS IN CLUSTERS ABUNDANT GRAM NEGATIVE RODS    Culture   Final    ABUNDANT STREPTOCOCCUS CONSTELLATUS SUSCEPTIBILITIES TO FOLLOW HOLDING FOR POSSIBLE ANAEROBE Performed at [facility], [place] [address] [place], [place]  [address]    Report Status PENDING  Incomplete  Fungus Culture With Stain     Status: None (Preliminary result)   Collection Time: 09/09/20  6:38 PM   Specimen: Abscess  Result Value Ref Range Status   Fungus Stain Final report  Final    Comment: (NOTE) Performed At: [facility] [place], [place] [id number] Rush Farmer MD [id number]    Fungus (Mycology) Culture PENDING  Incomplete   Fungal Source ABSCESS  Final    Comment: Performed at [facility], [address] [place], [place] [address]  Fungus Culture Result     Status: None   Collection Time: 09/09/20  6:38 PM  Result Value Ref Range Status   Result 1 Comment  Final    Comment: (NOTE) KOH/Calcofluor preparation:  no fungus observed. Performed At: [facility] [place], [place] [id number] Rush Farmer MD GB:[id number]       Studies: No results found.  Scheduled Meds:  arformoterol  15 mcg Nebulization BID   And   umeclidinium bromide  1 puff Inhalation Daily   bisacodyl  5 mg Oral Daily   budesonide (PULMICORT) nebulizer solution  0.5 mg Nebulization BID   enoxaparin (LOVENOX) injection  40 mg Subcutaneous Q24H   ferrous sulfate  325 mg Oral Daily   insulin aspart  0-5 Units Subcutaneous QHS   insulin aspart  0-9 Units Subcutaneous TID WC    lisinopril  10 mg Oral Daily   nicotine  21 mg Transdermal Daily    Continuous Infusions:  sodium chloride     ampicillin-sulbactam (UNASYN) IV 3 g (09/13/20 0757)     LOS: 4 days     Alma Friendly, MD [facility]  If 7PM-7AM, please contact night-coverage [email or web address] 09/13/2020, 3:36 PM

## 2020-09-13 NOTE — Progress Notes (Signed)
Patient ID: Jimmy Bishop, male   DOB: 03-17-1961, 60 y.o.   MRN: [id number]     [facility] Day(s): 4.   Interval History: Patient seen and examined, no acute events or new complaints overnight. Patient reports feeling well.  Patient denies severe pain.  Pain controlled with current pain medications.  Denies any issues with the dressing.  Vital signs in last 24 hours: [min-max] current  Temp:  [97.4 F (36.3 C)-97.8 F (36.6 C)] 97.8 F (36.6 C) (06/25 1536) Pulse Rate:  [66-78] 66 (06/25 1536) Resp:  [14-18] 15 (06/25 1536) BP: (115-155)/(68-85) 125/68 (06/25 1536) SpO2:  [94 %-100 %] 100 % (06/25 1536)     Height: 5' 9.02" (175.3 cm) Weight: 82.6 kg BMI (Calculated): 26.88   Physical Exam:  Constitutional: alert, cooperative and no distress  Extremity: Wound with healthy muscle and fat layer.  No necrotic or purulent secretions the wound measures 2.5 cm deep, 6 cm long and 3 cm wide.  Imaging studies: No new pertinent imaging studies   Assessment/Plan:  60 y.o. male with right forearm necrotizing fasciitis 4 Days Post-Op s/p cleansing and debridement, complicated by pertinent comorbidities including diabetes and hypertension.  There is no sign of infection on the wound.  Due to the complexity of the wound and deafness and the lack of assistant for the local care of the wound at home I proceeded to place a negative pressure dressing.  I personally put the negative pressure dressing today.  Negative pressure machine working properly.  Patient tolerated procedure well.    Arnold Long, MD

## 2020-09-13 NOTE — Progress Notes (Signed)
Hypoglycemic Event  CBG: 58  Treatment: 8 oz juice/soda  Symptoms: Hungry  Follow-up CBG: Time:  CBG Result: 138  Possible Reasons for Event: Unknown    Fuller Mandril, RN

## 2020-09-13 NOTE — TOC Progression Note (Addendum)
Transition of Care [facility]) - Progression Note    Patient Details  Name: ABDIRAHMAN CHITTUM MRN: [id number] Date of Birth: 01-21-1961  Transition of Care [facility]) CM/SW [place], LCSW Phone Number: 09/13/2020, 8:42 AM  Clinical Narrative:   CSW reached out to [facility] with [employer], asked about status of wound vac. Waiting on response.  10:50- Met with patient at bedside. Assisted patient with calling Zach with [employer] to put card on file for wound vac. Per Thedore Mins, patient will not be charged it is just for liability purposes. Vac to be delivered to bedside later today. Patient again confirms he has family to transport him to Dr. Deniece Ree office 2x/week for vac changes. Patient denies questions/needs.  11:50- Requested ETA for wound vac.  1:30- Requested update on wound vac delivery status.  2:00- Spoke to [employer]. Informed him per RN wound vac still has not been delivered. He reported it is next on the driver's delivery schedule so should be here soon. Updated RN and MD.  3:20- Wound vac still not delivered. Lane Hacker with [employer], he will call their driver. Per Dr. Horris Latino, culture is still pending so DC will now be tomorrow.   Expected Discharge Plan: Home/Self Care Barriers to Discharge: Barriers Resolved  Expected Discharge Plan and Services Expected Discharge Plan: Home/Self Care                         DME Arranged: Vac DME Agency: [employer] Date DME Agency Contacted: 09/12/20   Representative spoke with at DME Agency: Nash Shearer, Karena Addison             Social Determinants of Health (SDOH) Interventions    Readmission Risk Interventions No flowsheet data found.

## 2020-09-14 DIAGNOSIS — L03113 Cellulitis of right upper limb: Secondary | ICD-10-CM | POA: Diagnosis not present

## 2020-09-14 DIAGNOSIS — E119 Type 2 diabetes mellitus without complications: Secondary | ICD-10-CM | POA: Diagnosis not present

## 2020-09-14 DIAGNOSIS — I10 Essential (primary) hypertension: Secondary | ICD-10-CM | POA: Diagnosis not present

## 2020-09-14 DIAGNOSIS — W540XXA Bitten by dog, initial encounter: Secondary | ICD-10-CM | POA: Diagnosis not present

## 2020-09-14 DIAGNOSIS — L02413 Cutaneous abscess of right upper limb: Secondary | ICD-10-CM | POA: Diagnosis not present

## 2020-09-14 DIAGNOSIS — R6520 Severe sepsis without septic shock: Secondary | ICD-10-CM | POA: Diagnosis not present

## 2020-09-14 DIAGNOSIS — A419 Sepsis, unspecified organism: Secondary | ICD-10-CM | POA: Diagnosis not present

## 2020-09-14 LAB — GLUCOSE, CAPILLARY
Glucose-Capillary: 89 mg/dL (ref 70–99)
Glucose-Capillary: 94 mg/dL (ref 70–99)

## 2020-09-14 LAB — AEROBIC/ANAEROBIC CULTURE W GRAM STAIN (SURGICAL/DEEP WOUND)

## 2020-09-14 LAB — CULTURE, BLOOD (ROUTINE X 2)
Culture: NO GROWTH
Culture: NO GROWTH
Special Requests: ADEQUATE

## 2020-09-14 MED ORDER — OXYCODONE-ACETAMINOPHEN 5-325 MG PO TABS: 1.0000 | ORAL_TABLET | Freq: Four times a day (QID) | ORAL | 0 refills | Status: AC | PRN

## 2020-09-14 MED ORDER — CETIRIZINE HCL 10 MG PO TABS: 10.0000 mg | ORAL_TABLET | Freq: Every day | ORAL | 0 refills | Status: DC

## 2020-09-14 MED ORDER — AMOXICILLIN-POT CLAVULANATE 875-125 MG PO TABS: 1.0000 | ORAL_TABLET | Freq: Two times a day (BID) | ORAL | 0 refills | Status: AC

## 2020-09-14 MED ORDER — BISACODYL 5 MG PO TBEC: 5.0000 mg | DELAYED_RELEASE_TABLET | Freq: Every day | ORAL | 0 refills | Status: AC

## 2020-09-14 NOTE — Plan of Care (Signed)
Patient has supplies for outpatient wound vac.  IV's will be removed at discharge, and discharge instructions reviewed

## 2020-09-14 NOTE — TOC Transition Note (Signed)
Transition of Care [facility]) - CM/SW Discharge Note   Patient Details  Name: Jimmy Bishop MRN: [id number] Date of Birth: 02/23/1961  Transition of Care [facility]) CM/SW Contact:  Beverly Sessions, RN Phone Number: 09/14/2020, 12:42 PM   Clinical Narrative:    Wound vac has been delivered to room and applied to patient Patient has follow up schedule 6/28 for vac change in the office    Final next level of care: Home/Self Care Barriers to Discharge: Barriers Resolved   Patient Goals and CMS Choice Patient states their goals for this hospitalization and ongoing recovery are:: home CMS Medicare.gov Compare Post Acute Care list provided to:: Patient Choice offered to / list presented to : Patient  Discharge Placement                    Patient and family notified of of transfer: 09/12/20  Discharge Plan and Services                DME Arranged: Vac DME Agency: [employer] Date DME Agency Contacted: 09/12/20   Representative spoke with at DME Agency: Nash Shearer, Karena Addison            Social Determinants of Health ([place]) Interventions     Readmission Risk Interventions No flowsheet data found.

## 2020-09-14 NOTE — Progress Notes (Signed)
Patient ID: Jimmy Bishop, male   DOB: 12/13/1960, 60 y.o.   MRN: [id number]     [facility] Day(s): 5.   Interval History: Patient seen and examined, no acute events or new complaints overnight. Patient reports feeling well.  He denies any problem with the negative pressure dressing.  Pain localized to the wound.  No radiation.  Alleviating factor is Percocet.  No aggravating factor.  Vital signs in last 24 hours: [min-max] current  Temp:  [97.5 F (36.4 C)-98.6 F (37 C)] 98.6 F (37 C) (06/26 0814) Pulse Rate:  [66-79] 79 (06/26 0814) Resp:  [15-20] 15 (06/26 0814) BP: (115-137)/(67-97) 137/97 (06/26 0814) SpO2:  [94 %-100 %] 100 % (06/26 0814)     Height: 5' 9.02" (175.3 cm) Weight: 82.6 kg BMI (Calculated): 26.88   Physical Exam:  Constitutional: alert, cooperative and no distress  Extremity: Right forearm wound covered with negative pressure dressing without any complications.   Imaging studies: No new pertinent imaging studies   Assessment/Plan:  60 y.o. male with right forearm necrotizing fasciitis 5 Days Post-Op s/p cleansing and debridement, complicated by pertinent comorbidities including diabetes and hypertension.  Patient with negative pressure dressing placed yesterday.  No issues with the negative pressure dressing.  From surgical standpoint patient can be discharge when medically stable.  I discussed with the patient to follow-up in my office in 2 days for negative pressure dressing change.  Pain medication prescription sent to pharmacy.  Arnold Long, MD

## 2020-09-14 NOTE — Discharge Summary (Signed)
Discharge Summary  Jimmy Bishop VEH:[id number] DOB: 1960-12-11  PCP: [place] [place]  Admit date: 09/09/2020 Discharge date: 09/14/2020  Time spent: 40 mins  Recommendations for Outpatient Follow-up:  Follow-up with general surgery as scheduled Follow-up with ID as scheduled Follow-up with PCP in 1 week  Discharge Diagnoses:  Active Hospital Problems   Diagnosis Date Noted   Right forearm cellulitis 09/09/2020   Dog bite 09/09/2020   COPD (chronic obstructive pulmonary disease) ([place]) 09/09/2020   Abscess of right forearm 09/09/2020   Hypertension    Severe sepsis (HCC)    HLD (hyperlipidemia)    Tobacco abuse    Diabetes mellitus without complication [facility])     Resolved Hospital Problems  No resolved problems to display.    Discharge Condition: Stable  Diet recommendation: Heart healthy, moderate carb  Vitals:   09/14/20 0741 09/14/20 0814  BP:  (!) 137/97  Pulse:  79  Resp:  15  Temp:  98.6 F (37 C)  SpO2: 94% 100%    History of present illness:  Jimmy Bishop is a 60 y.o. male with medical history significant of HTN, HLD, DM, COPD, tobacco abuse, anemia, who presents with right arm pain after dog bite. Pt states that he was bitten by a stray dog, then developed pain and swelling in right forearm, which has been progressively worsening.  He also developed redness and pus drainage from the 2 bite sites. Patient was given Augmentin prescription, but has not started taking this medication yet. In the ED, pt was initially hypotensive with a blood pressure 89/59, which improved to 118/76 after giving 2.5 L LR bolus, HR 104, WBC 13.3, lactic acid of 2.4, negative COVID PCR, renal function stable.  Chest x-ray negative.  Patient admitted for further management. Gen surgery Dr Peyton Najjar consulted, pt is s/p debridement of R forearm on 09/09/20.    Today, patient denies any new complaints, educated on need to be compliant with his appointments especially general  surgery to evaluate wound.  Patient verbalized understanding, reports that he will be able to go to general surgery twice a week as recommended.  Hospital Course:  Principal Problem:   Right forearm cellulitis Active Problems:   Dog bite   Hypertension   Severe sepsis (HCC)   HLD (hyperlipidemia)   Tobacco abuse   Diabetes mellitus without complication (HCC)   COPD (chronic obstructive pulmonary disease) (HCC)   Abscess of right forearm   Severe sepsis due to right forearm necrotizing fasciitis s/p debridement and drainage of deep muscular abscess on 09/09/2020 with wound VAC placement on 09/13/2020 Severe sepsis with leukocytosis, tachycardia, LA 2.4, initially hypotensive, responded to IVF Currently afebrile, with no leukocytosis BC x2 NGTD General surgery consulted, s/p debridement on 09/09/2020 Gram stain showing moderate gram-positive cocci in pairs, abundant gram-negative rods Surgical cultures grew abundant Streptococcus constellatus, Bacteroides uniformis, discussed with Dr. Gale Journey on 09/14/2020, recommend Augmentin twice daily for 3 to 4 weeks Follow-up with general surgery for wound check twice weekly with wound VAC Follow-up with ID on 09/18/2020 S/P IV Unasyn, switched to p.o. Augmentin for about 3 to 4 weeks   Dog bite Patient was given rabies immunoglobulin and rabies vaccine in ED   Hypertension Restart lisinopril   Tobacco abuse Advised to quit Nicotine patch   Diabetes mellitus without complication Last E3M was 5.6 on 09/09/2020 Continue metformin at home   COPD Stable Bronchodilators     Estimated body mass index is 26.88 kg/m as calculated from the following:  Height as of this encounter: 5' 9.02" (1.753 m).   Weight as of this encounter: 82.6 kg.    Procedures: Debridement and drainage of deep muscular abscess on 09/09/2020 S/p wound VAC on 09/13/2020  Consultations: General surgery  Discharge Exam: BP (!) 137/97 (BP Location: Left Arm)   Pulse  79   Temp 98.6 F (37 C) (Oral)   Resp 15   Ht 5' 9.02" (1.753 m)   Wt 82.6 kg   SpO2 100%   BMI 26.88 kg/m   General: NAD  Cardiovascular: S1, S2 present Respiratory: CTAB Abdomen: Soft, nontender, nondistended, bowel sounds present Musculoskeletal: No bilateral pedal edema noted, right forearm dressing C/D/I Skin: As noted above Psychiatry: Normal mood     Discharge Instructions You were cared for by a hospitalist during your hospital stay. If you have any questions about your discharge medications or the care you received while you were in the hospital after you are discharged, you can call the unit and asked to speak with the hospitalist on call if the hospitalist that took care of you is not available. Once you are discharged, your primary care physician will handle any further medical issues. Please note that NO REFILLS for any discharge medications will be authorized once you are discharged, as it is imperative that you return to your primary care physician (or establish a relationship with a primary care physician if you do not have one) for your aftercare needs so that they can reassess your need for medications and monitor your lab values.  Discharge Instructions     Diet - low sodium heart healthy   Complete by: As directed    Discharge wound care:   Complete by: As directed    Negative pressure wound therapy  Every 3 days     Comments: Negative pressure dressing changes will be Tuesdays and Thursdays. Provide 4 weeks supply Amount of suction? 125 mm/Hg   Suction Type? Continuous   Increase activity slowly   Complete by: As directed       Allergies as of 09/14/2020       Reactions   Vicodin [hydrocodone-acetaminophen] Hives, Rash        Medication List     STOP taking these medications    aspirin 81 MG EC tablet   meloxicam 15 MG tablet Commonly known as: MOBIC   pravastatin 40 MG tablet Commonly known as: PRAVACHOL       TAKE these medications     albuterol 108 (90 Base) MCG/ACT inhaler Commonly known as: VENTOLIN HFA Inhale 2 puffs into the lungs every 6 (six) hours as needed for wheezing or shortness of breath.   amLODipine 10 MG tablet Commonly known as: NORVASC Take 1 tablet by mouth daily.   amoxicillin-clavulanate 875-125 MG tablet Commonly known as: Augmentin Take 1 tablet by mouth 2 (two) times daily for 21 days.   bisacodyl 5 MG EC tablet Commonly known as: DULCOLAX Take 1 tablet (5 mg total) by mouth daily for 14 days. Start taking on: September 15, 2020   cetirizine 10 MG tablet Commonly known as: ZYRTEC Take 1 tablet (10 mg total) by mouth daily.   ferrous sulfate 325 (65 FE) MG tablet Take 1 tablet by mouth daily.   Flovent HFA 220 MCG/ACT inhaler Generic drug: fluticasone Inhale 2 puffs into the lungs 2 (two) times daily.   lisinopril 20 MG tablet Commonly known as: ZESTRIL Take 1 tablet by mouth daily. What changed: Another medication with the same name  was removed. Continue taking this medication, and follow the directions you see here.   metFORMIN 500 MG tablet Commonly known as: GLUCOPHAGE Take 500 mg by mouth 2 (two) times daily with a meal.   oxyCODONE-acetaminophen 5-325 MG tablet Commonly known as: Percocet Take 1 tablet by mouth every 6 (six) hours as needed for severe pain.   Stiolto Respimat 2.5-2.5 MCG/ACT Aers Generic drug: Tiotropium Bromide-Olodaterol SMARTSIG:2 Puff(s) Via Inhaler Every Morning   Vitamin D3 1.25 MG (50000 UT) Caps Take 1 capsule by mouth once a week.               Discharge Care Instructions  (From admission, onward)           Start     Ordered   09/14/20 0000  Discharge wound care:       Comments: Negative pressure wound therapy  Every 3 days     Comments: Negative pressure dressing changes will be Tuesdays and Thursdays. Provide 4 weeks supply Amount of suction? 125 mm/Hg   Suction Type? Continuous   09/14/20 1241           Allergies   Allergen Reactions   Vicodin [Hydrocodone-Acetaminophen] Hives and Rash    Follow-up Information     [name], MD Follow up on 09/16/2020.   Specialty: General Surgery Why: For wound re-check of right forearm debridment and negative pressure dressing change. Contact information: [place] [address] [phone]         Tsosie Billing, MD. Go on 09/18/2020.   Specialty: Infectious Diseases Why: Go at 10:15 am to follow up on your infection Contact information: [place] [address] [phone]         [facility] Schedule an appointment as soon as possible for a visit in 1 week(s).   Contact information: [place] [address] [phone]                  The results of significant diagnostics from this hospitalization (including imaging, microbiology, ancillary and laboratory) are listed below for reference.    Significant Diagnostic Studies: DG Forearm Right  Result Date: 09/09/2020 CLINICAL DATA:  Pain and swelling after dog bite 2 days ago. EXAM: RIGHT FOREARM - 2 VIEW COMPARISON:  None. FINDINGS: No evidence of acute fracture. Approximately 3 mm geometric radiodensity along the volar and radial aspect of the proximal forearm. Additional punctate, faint densities more distally. Extensive surrounding subcutaneous and muscular edema of the forearm. No bony erosive change. IMPRESSION: 1. Approximately 3 mm geometric radiodensity along the volar and radial aspect of the proximal forearm, suspicious for a foreign body. Additional punctate, faint more distal densities most likely represent vascular calcifications. 2. Extensive surrounding subcutaneous and muscular edema of the forearm, which could represent cellulitis/myositis and/or contusion/hematoma. 3. No evidence of acute fracture. Electronically Signed   By: Margaretha Sheffield MD   On: 09/09/2020 12:37   DG Chest Port 1  View  Result Date: 09/09/2020 CLINICAL DATA:  Dog bite, possible sepsis EXAM: PORTABLE CHEST 1 VIEW COMPARISON:  12/01/2019 FINDINGS: No consolidation or edema. No pleural effusion or pneumothorax. Normal heart size. Partially imaged cervicothoracic fusion hardware. IMPRESSION: No acute process in the chest. Electronically Signed   By: Macy Mis M.D.   On: 09/09/2020 12:33    Microbiology: Recent Results (from the past 240 hour(s))  Resp Panel by RT-PCR (Flu A&B, Covid) Nasopharyngeal Swab     Status: None  Collection Time: 09/09/20 11:46 AM   Specimen: Nasopharyngeal Swab; Nasopharyngeal(NP) swabs in vial transport medium  Result Value Ref Range Status   SARS Coronavirus 2 by RT PCR NEGATIVE NEGATIVE Final    Comment: (NOTE) SARS-CoV-2 target nucleic acids are NOT DETECTED.  The SARS-CoV-2 RNA is generally detectable in upper respiratory specimens during the acute phase of infection. The lowest concentration of SARS-CoV-2 viral copies this assay can detect is 138 copies/mL. A negative result does not preclude SARS-Cov-2 infection and should not be used as the sole basis for treatment or other patient management decisions. A negative result may occur with  improper specimen collection/handling, submission of specimen other than nasopharyngeal swab, presence of viral mutation(s) within the areas targeted by this assay, and inadequate number of viral copies(<138 copies/mL). A negative result must be combined with clinical observations, patient history, and epidemiological information. The expected result is Negative.  Fact Sheet for Patients:  EntrepreneurPulse.com.au  Fact Sheet for Healthcare Providers:  IncredibleEmployment.be  This test is no t yet approved or cleared by the [place] FDA and  has been authorized for detection and/or diagnosis of SARS-CoV-2 by FDA under an Emergency Use Authorization (EUA). This EUA will remain  in  effect (meaning this test can be used) for the duration of the COVID-19 declaration under Section 564(b)(1) of the Act, 21 U.S.C.section 360bbb-3(b)(1), unless the authorization is terminated  or revoked sooner.       Influenza A by PCR NEGATIVE NEGATIVE Final   Influenza B by PCR NEGATIVE NEGATIVE Final    Comment: (NOTE) The Xpert Xpress SARS-CoV-2/FLU/RSV plus assay is intended as an aid in the diagnosis of influenza from Nasopharyngeal swab specimens and should not be used as a sole basis for treatment. Nasal washings and aspirates are unacceptable for Xpert Xpress SARS-CoV-2/FLU/RSV testing.  Fact Sheet for Patients: EntrepreneurPulse.com.au  Fact Sheet for Healthcare Providers: IncredibleEmployment.be  This test is not yet approved or cleared by the [place] FDA and has been authorized for detection and/or diagnosis of SARS-CoV-2 by FDA under an Emergency Use Authorization (EUA). This EUA will remain in effect (meaning this test can be used) for the duration of the COVID-19 declaration under Section 564(b)(1) of the Act, 21 U.S.C. section 360bbb-3(b)(1), unless the authorization is terminated or revoked.  Performed at [facility], [address] [place], [place] [address]   Blood Culture (routine x 2)     Status: None   Collection Time: 09/09/20 11:46 AM   Specimen: BLOOD  Result Value Ref Range Status   Specimen Description BLOOD LEFT ANTECUBITAL  Final   Special Requests   Final    BOTTLES DRAWN AEROBIC AND ANAEROBIC Blood Culture adequate volume   Culture   Final    NO GROWTH 5 DAYS Performed at [facility], [address] [place], [place] [address]    Report Status 09/14/2020 FINAL  Final  Blood Culture (routine x 2)     Status: None   Collection Time: 09/09/20 11:46 AM   Specimen: BLOOD  Result Value Ref Range Status   Specimen Description BLOOD BLOOD LEFT FOREARM  Final   Special Requests    Final    BOTTLES DRAWN AEROBIC AND ANAEROBIC Blood Culture results may not be optimal due to an inadequate volume of blood received in culture bottles   Culture   Final    NO GROWTH 5 DAYS Performed at [facility], [address] [place], [place] [address]    Report Status  09/14/2020 FINAL  Final  Aerobic/Anaerobic Culture w Gram Stain (surgical/deep wound)     Status: None (Preliminary result)   Collection Time: 09/09/20  6:30 PM   Specimen: Abscess  Result Value Ref Range Status   Specimen Description   Final    ABSCESS RIGHT FOREARM Performed at [facility], [place] [address] [place], [place] [address]    Special Requests   Final    NONE Performed at [place], [place] [address]    Gram Stain   Final    MODERATE WBC PRESENT, PREDOMINANTLY PMN MODERATE GRAM POSITIVE COCCI IN PAIRS IN CLUSTERS ABUNDANT GRAM NEGATIVE RODS    Culture   Final    ABUNDANT STREPTOCOCCUS CONSTELLATUS ABUNDANT BACTEROIDES UNIFORMIS BETA LACTAMASE NEGATIVE Performed at [facility], [place] [address], [place], [place] [address]    Report Status PENDING  Incomplete   Organism ID, Bacteria STREPTOCOCCUS CONSTELLATUS  Final      Susceptibility   Streptococcus constellatus - MIC*    PENICILLIN INTERMEDIATE Intermediate     CEFTRIAXONE 1 SENSITIVE Sensitive     ERYTHROMYCIN <=0.12 SENSITIVE Sensitive     LEVOFLOXACIN 0.5 SENSITIVE Sensitive     VANCOMYCIN 0.5 SENSITIVE Sensitive     * ABUNDANT STREPTOCOCCUS CONSTELLATUS  Fungus Culture With Stain     Status: None (Preliminary result)   Collection Time: 09/09/20  6:38 PM   Specimen: Abscess  Result Value Ref Range Status   Fungus Stain Final report  Final    Comment: (NOTE) Performed At: [facility] [address] [place], [place] [id number] Rush Farmer MD [id number]    Fungus (Mycology) Culture PENDING  Incomplete   Fungal Source ABSCESS  Final    Comment: Performed at  [facility], [address] [place], [place] [address]  Fungus Culture Result     Status: None   Collection Time: 09/09/20  6:38 PM  Result Value Ref Range Status   Result 1 Comment  Final    Comment: (NOTE) KOH/Calcofluor preparation:  no fungus observed. Performed At: [facility] [place], [place] [id number] Rush Farmer MD [id number]      Labs: Basic Metabolic Panel: Recent Labs  Lab 09/09/20 1043 09/10/20 0408 09/11/20 0431 09/12/20 0545 09/13/20 0606  NA 131* 135 138 138  --   K 3.7 4.6 4.0 4.0  --   CL 101 103 104 105  --   CO2 19* 27 28 26   --   GLUCOSE 225* 145* 112* 86  --   BUN 19 13 15 16   --   CREATININE 1.19 0.68 0.64 0.65 0.69  CALCIUM 9.1 8.8* 9.2 9.6  --    Liver Function Tests: Recent Labs  Lab 09/09/20 1043  AST 17  ALT 16  ALKPHOS 56  BILITOT 1.2  PROT 7.6  ALBUMIN 3.7   No results for input(s): LIPASE, AMYLASE in the last 168 hours. No results for input(s): AMMONIA in the last 168 hours. CBC: Recent Labs  Lab 09/09/20 1043 09/10/20 0408 09/11/20 0431 09/12/20 0545  WBC 13.3* 7.9 9.9 6.4  NEUTROABS 10.4*  --  7.8* 3.5  HGB 14.9 13.7 13.4 13.8  HCT 43.5 40.2 39.8 41.1  MCV 97.1 98.0 97.3 98.1  PLT 311 290 307 330   Cardiac Enzymes: No results for input(s): CKTOTAL, CKMB, CKMBINDEX, TROPONINI in the last 168 hours. BNP: BNP (last 3 results) Recent Labs    09/09/20 1318  BNP 30.8  ProBNP (last 3 results) No results for input(s): PROBNP in the last 8760 hours.  CBG: Recent Labs  Lab 09/13/20 1136 09/13/20 1643 09/13/20 2034 09/14/20 0814 09/14/20 1152  GLUCAP 138* 81 142* 89 94       Signed:  Alma Friendly, MD [facility] 09/14/2020, 1:04 PM

## 2020-09-14 NOTE — Progress Notes (Signed)
Chart check  Patient with stray dog bite right forearm, complicated by [place] Post fasc S/p I&D 6/21 Rabies vaccine given in ED Being discharged  Cx operative only strep constellatus Bcx negative  Last metabolic panel Lab Results  Component Value Date   GLUCOSE 86 09/12/2020   NA 138 09/12/2020   K 4.0 09/12/2020   CL 105 09/12/2020   CO2 26 09/12/2020   BUN 16 09/12/2020   CREATININE 0.69 09/13/2020   GFRNONAA >60 09/13/2020   GFRAA >60 12/01/2019   CALCIUM 9.6 09/12/2020   PROT 7.6 09/09/2020   ALBUMIN 3.7 09/09/2020   BILITOT 1.2 09/09/2020   ALKPHOS 56 09/09/2020   AST 17 09/09/2020   ALT 16 09/09/2020   ANIONGAP 7 09/12/2020      -Advised team to give amox-clav bid 875 mg for 3-4 weeks -have arranged for patient to see ID clinic Dr Delaine Lame at the [facility] clinic -discussed with team   6/30 @ 1015am  Address: [address], [place], [place] [address] Phone: [phone]

## 2020-09-15 ENCOUNTER — Telehealth: Payer: Self-pay | Admitting: *Deleted

## 2020-09-15 NOTE — Telephone Encounter (Signed)
Transition Care Management Unsuccessful Follow-up Telephone Call  Date of discharge and from where:  09/14/2020 [facility]  Attempts:  1st Attempt  Reason for unsuccessful TCM follow-up call:  Left voice message

## 2020-09-16 DIAGNOSIS — W540XXA Bitten by dog, initial encounter: Secondary | ICD-10-CM | POA: Diagnosis not present

## 2020-09-16 DIAGNOSIS — S51851A Open bite of right forearm, initial encounter: Secondary | ICD-10-CM | POA: Diagnosis not present

## 2020-09-16 NOTE — Telephone Encounter (Signed)
Transition Care Management Unsuccessful Follow-up Telephone Call  Date of discharge and from where:  09/14/2020 [facility]   Attempts:  2nd Attempt  Reason for unsuccessful TCM follow-up call:  Left voice message

## 2020-09-17 ENCOUNTER — Other Ambulatory Visit: Payer: Self-pay | Admitting: *Deleted

## 2020-09-17 DIAGNOSIS — W540XXD Bitten by dog, subsequent encounter: Secondary | ICD-10-CM | POA: Diagnosis not present

## 2020-09-17 DIAGNOSIS — M726 Necrotizing fasciitis: Secondary | ICD-10-CM | POA: Diagnosis not present

## 2020-09-17 NOTE — Telephone Encounter (Signed)
Transition Care Management Unsuccessful Follow-up Telephone Call  Date of discharge and from where:  09/14/2020 [facility]  Attempts:  3rd Attempt  Reason for unsuccessful TCM follow-up call:  Left voice message

## 2020-09-17 NOTE — Patient Outreach (Signed)
Care Coordination  09/17/2020  Jimmy Bishop 1961/03/09 [id number]   Medicaid Managed Care   Unsuccessful Outreach Note  09/17/2020 Name: Jimmy Bishop MRN: [id number] DOB: 1960/11/26  Referred by: A[place] Reason for referral : High Risk Managed Medicaid (Unsuccessful RNCM initial outreach)   An unsuccessful telephone outreach was attempted today. The patient was referred to the case management team for assistance with care management and care coordination.   Follow Up Plan: The care management team will reach out to the patient again over the next 7-14 days.   Lurena Joiner RN, BSN [place]  [employer] manager

## 2020-09-17 NOTE — Patient Instructions (Signed)
Visit Information  [name]  - as a part of your Medicaid benefit, you are eligible for care management and care coordination services at no cost or copay. I was unable to reach you by phone today but would be happy to help you with your health related needs. Please feel free to call me @ [phone].   A member of the Managed Medicaid care management team will reach out to you again over the next 7 days on 09/19/20 @ 11:30am.   Lurena Joiner RN, [place] RN Care Coordinator

## 2020-09-18 ENCOUNTER — Other Ambulatory Visit: Payer: Self-pay

## 2020-09-18 ENCOUNTER — Ambulatory Visit: Payer: Medicaid Other | Attending: Infectious Diseases | Admitting: Infectious Diseases

## 2020-09-18 VITALS — BP 120/78 | HR 88 | Temp 97.7°F | Resp 15 | Ht 69.0 in | Wt 182.0 lb

## 2020-09-18 DIAGNOSIS — F17210 Nicotine dependence, cigarettes, uncomplicated: Secondary | ICD-10-CM | POA: Insufficient documentation

## 2020-09-18 DIAGNOSIS — Z203 Contact with and (suspected) exposure to rabies: Secondary | ICD-10-CM | POA: Diagnosis not present

## 2020-09-18 DIAGNOSIS — I10 Essential (primary) hypertension: Secondary | ICD-10-CM | POA: Insufficient documentation

## 2020-09-18 DIAGNOSIS — Z7951 Long term (current) use of inhaled steroids: Secondary | ICD-10-CM | POA: Insufficient documentation

## 2020-09-18 DIAGNOSIS — Z23 Encounter for immunization: Secondary | ICD-10-CM | POA: Diagnosis not present

## 2020-09-18 DIAGNOSIS — J449 Chronic obstructive pulmonary disease, unspecified: Secondary | ICD-10-CM | POA: Insufficient documentation

## 2020-09-18 DIAGNOSIS — Z7901 Long term (current) use of anticoagulants: Secondary | ICD-10-CM | POA: Insufficient documentation

## 2020-09-18 DIAGNOSIS — Z7984 Long term (current) use of oral hypoglycemic drugs: Secondary | ICD-10-CM | POA: Diagnosis not present

## 2020-09-18 DIAGNOSIS — M79601 Pain in right arm: Secondary | ICD-10-CM | POA: Diagnosis not present

## 2020-09-18 DIAGNOSIS — M726 Necrotizing fasciitis: Secondary | ICD-10-CM | POA: Diagnosis not present

## 2020-09-18 DIAGNOSIS — Z79899 Other long term (current) drug therapy: Secondary | ICD-10-CM | POA: Insufficient documentation

## 2020-09-18 DIAGNOSIS — E119 Type 2 diabetes mellitus without complications: Secondary | ICD-10-CM | POA: Insufficient documentation

## 2020-09-18 NOTE — Progress Notes (Signed)
NAME: Jimmy Bishop  DOB: [birth year]  MRN: [id number]  Date/Time: 09/18/2020 10:40 AM   Subjective:   ? Jimmy Bishop is a 60 y.o. male with h/o HTN, DM, COPD, current smoker is here to see me after hospital discharge- pt was hospitalized on 09/09/20 for dog bite from a stray dog. He had rt forearm deep abscess for which he was taken for debridement on 09/09/20- A 10 cm x 6 cm (60 sq cm) of necrotizing fasciitis of the brachioradialis muscle with abscess deeper to the brachioradialis muscle was seen. A wound vac was placed. Cultures grew strep constellatus- He was given IV unayn after a few days of broad spectrum coverage. HE received 1 st dose of Rabies vaccine and Immunoglobulin on 09/09/20 in the ED.He did not receive the 2nd dose of vaccine on day 3 when he was in the hospital.  He was discharged home on 09/14/20 on 3 weeks of augmentin and asked to see me as OP- I did not see him while he was in the hospital. Pt saw surgeon after discharge on 09/16/20- and was noted to have no sign of infection and the wound healing slowly He says he is feeling better- still has pain rt arm. Taking augmentin regularly No diarrhea  Past Medical History:  Diagnosis Date   Arthritis    left knee   COPD (chronic obstructive pulmonary disease) ([place])    Diabetes mellitus without complication ([place])    Hypertension     Past Surgical History:  Procedure Laterality Date   ANKLE SURGERY     COLONOSCOPY WITH PROPOFOL N/A 07/20/2017   Procedure: COLONOSCOPY WITH PROPOFOL;  Surgeon: Toledo, Benay Pike, MD;  Location: [facility] ENDOSCOPY;  Service: Gastroenterology;  Laterality: N/A;   IRRIGATION AND DEBRIDEMENT ABSCESS Right 09/09/2020   Procedure: IRRIGATION AND DEBRIDEMENT ABSCESS-RIGHT FOREARM;  Surgeon: [name], MD;  Location: [facility] ORS;  Service: General;  Laterality: Right;   POSTERIOR CERVICAL FUSION/FORAMINOTOMY N/A 04/18/2015   Procedure: Posterior cervical fusion with lateral mass fixation -  Cervical three-Thoracic one, cervical laminectomy Cervical three-Cervical seven;  Surgeon: Eustace Moore, MD;  Location: [place] [facility] OR[facility];  Service: Neurosurgery;  Laterality: N/A;    Social History   Socioeconomic History   Marital status: Single    Spouse name: Not on file   Number of children: Not on file   Years of education: Not on file   Highest education level: Not on file  Occupational History   Not on file  Tobacco Use   Smoking status: Every Day    Packs/day: 1.00    Years: 40.00    Pack years: 40.00    Types: Cigarettes   Smokeless tobacco: Never  Substance and Sexual Activity   Alcohol use: Yes    Comment: occ   Drug use: No   Sexual activity: Not on file  Other Topics Concern   Not on file  Social History Narrative   Per conversation with patient's mother/DPR Gilman Schmidt [phone] patient has limited social support and will be dependent upon her and niece for assistance at home with post-d/c needs.   Social Determinants of Health   Financial Resource Strain: Not on file  Food Insecurity: Not on file  Transportation Needs: Not on file  Physical Activity: Not on file  Stress: Not on file  Social Connections: Not on file  Intimate Partner Violence: Not on file    Family History  Problem Relation Age of Onset   Lung cancer Mother  CAD Mother    Lung cancer Sister    Allergies  Allergen Reactions   Vicodin [Hydrocodone-Acetaminophen] Hives and Rash   I? Current Outpatient Medications  Medication Sig Dispense Refill   albuterol (PROVENTIL HFA;VENTOLIN HFA) 108 (90 Base) MCG/ACT inhaler Inhale 2 puffs into the lungs every 6 (six) hours as needed for wheezing or shortness of breath. 1 Inhaler 2   amLODipine (NORVASC) 10 MG tablet Take 1 tablet by mouth daily.     amoxicillin-clavulanate (AUGMENTIN) 875-125 MG tablet Take 1 tablet by mouth 2 (two) times daily for 21 days. 42 tablet 0   bisacodyl (DULCOLAX) 5 MG EC tablet Take 1 tablet (5 mg total) by  mouth daily for 14 days. 14 tablet 0   cetirizine (ZYRTEC) 10 MG tablet Take 1 tablet (10 mg total) by mouth daily. 30 tablet 0   Cholecalciferol (VITAMIN D3) 1.25 MG (50000 UT) CAPS Take 1 capsule by mouth once a week.     ferrous sulfate 325 (65 FE) MG tablet Take 1 tablet by mouth daily.     FLOVENT HFA 220 MCG/ACT inhaler Inhale 2 puffs into the lungs 2 (two) times daily.     lisinopril (ZESTRIL) 20 MG tablet Take 1 tablet by mouth daily.     metFORMIN (GLUCOPHAGE) 500 MG tablet Take 500 mg by mouth 2 (two) times daily with a meal.     oxyCODONE-acetaminophen (PERCOCET) 5-325 MG tablet Take 1 tablet by mouth every 6 (six) hours as needed for severe pain. 20 tablet 0   STIOLTO RESPIMAT 2.5-2.5 MCG/ACT AERS SMARTSIG:2 Puff(s) Via Inhaler Every Morning     No current facility-administered medications for this visit.     Abtx:  Anti-infectives (From admission, onward)    None       REVIEW OF SYSTEMS:  Const: negative fever, negative chills, negative weight loss Eyes: negative diplopia or visual changes, negative eye pain ENT: negative coryza, negative sore throat Resp: negative cough, hemoptysis, dyspnea Cards: negative for chest pain, palpitations, lower extremity edema GU: negative for frequency, dysuria and hematuria GI: Negative for abdominal pain, diarrhea, bleeding, constipation Skin: negative for rash and pruritus Heme: negative for easy bruising and gum/nose bleeding MS: pain rt arm Neurolo:negative for headaches, dizziness, vertigo, memory problems  Psych: negative for feelings of anxiety, depression  Endocrine: , diabetes Allergy/Immunology- as above ? Objective:  VITALS:  BP 120/78   Pulse 88   Temp 97.7 F (36.5 C)   Resp 15   Ht 5\' 9"  (1.753 m)   Wt 182 lb (82.6 kg)   SpO2 96%   BMI 26.88 kg/m  PHYSICAL EXAM:  General: Alert, cooperative, no distress, appears stated age.  Head: Normocephalic, without obvious abnormality, atraumatic. Eyes: Conjunctivae  clear, anicteric sclerae. Pupils are equal ENT Nares normal. No drainage or sinus tenderness. Lips, mucosa, and tongue normal. No Thrush Neck: Supple, symmetrical, no adenopathy, thyroid: non tender no carotid bruit and no JVD. Back: No CVA tenderness. Lungs: Clear to auscultation bilaterally. No Wheezing or Rhonchi. No rales. Heart: Regular rate and rhythm, no murmur, rub or gallop. Abdomen: Soft, non-tender,not distended. Bowel sounds normal. No masses Extremities: rt arm has wound vac- no erythema around- no swelling Skin: No rashes or lesions. Or bruising Lymph: Cervical, supraclavicular normal. Neurologic: Grossly non-focal Pertinent Labs Lab Results CBC    Component Value Date/Time   WBC 6.4 09/12/2020 0545   RBC 4.19 (L) 09/12/2020 0545   HGB 13.8 09/12/2020 0545   HGB 17.2 06/25/2014 1055  HCT 41.1 09/12/2020 0545   HCT 51.5 06/25/2014 1055   PLT 330 09/12/2020 0545   PLT 203 06/25/2014 1055   MCV 98.1 09/12/2020 0545   MCV 94 06/25/2014 1055   MCH 32.9 09/12/2020 0545   MCHC 33.6 09/12/2020 0545   RDW 12.6 09/12/2020 0545   RDW 13.8 06/25/2014 1055   LYMPHSABS 2.1 09/12/2020 0545   MONOABS 0.7 09/12/2020 0545   EOSABS 0.0 09/12/2020 0545   BASOSABS 0.0 09/12/2020 0545    CMP Latest Ref Rng & Units 09/13/2020 09/12/2020 09/11/2020  Glucose 70 - 99 mg/dL - 86 112(H)  BUN 6 - 20 mg/dL - 16 15  Creatinine 0.61 - 1.24 mg/dL 0.69 0.65 0.64  Sodium 135 - 145 mmol/L - 138 138  Potassium 3.5 - 5.1 mmol/L - 4.0 4.0  Chloride 98 - 111 mmol/L - 105 104  CO2 22 - 32 mmol/L - 26 28  Calcium 8.9 - 10.3 mg/dL - 9.6 9.2  Total Protein 6.5 - 8.1 g/dL - - -  Total Bilirubin 0.3 - 1.2 mg/dL - - -  Alkaline Phos 38 - 126 U/L - - -  AST 15 - 41 U/L - - -  ALT 0 - 44 U/L - - -      Microbiology: Recent Results (from the past 240 hour(s))  Resp Panel by RT-PCR (Flu A&B, Covid) Nasopharyngeal Swab     Status: None   Collection Time: 09/09/20 11:46 AM   Specimen:  Nasopharyngeal Swab; Nasopharyngeal(NP) swabs in vial transport medium  Result Value Ref Range Status   SARS Coronavirus 2 by RT PCR NEGATIVE NEGATIVE Final    Comment: (NOTE) SARS-CoV-2 target nucleic acids are NOT DETECTED.  The SARS-CoV-2 RNA is generally detectable in upper respiratory specimens during the acute phase of infection. The lowest concentration of SARS-CoV-2 viral copies this assay can detect is 138 copies/mL. A negative result does not preclude SARS-Cov-2 infection and should not be used as the sole basis for treatment or other patient management decisions. A negative result may occur with  improper specimen collection/handling, submission of specimen other than nasopharyngeal swab, presence of viral mutation(s) within the areas targeted by this assay, and inadequate number of viral copies(<138 copies/mL). A negative result must be combined with clinical observations, patient history, and epidemiological information. The expected result is Negative.  Fact Sheet for Patients:  EntrepreneurPulse.com.au  Fact Sheet for Healthcare Providers:  IncredibleEmployment.be  This test is no t yet approved or cleared by the [place] FDA and  has been authorized for detection and/or diagnosis of SARS-CoV-2 by FDA under an Emergency Use Authorization (EUA). This EUA will remain  in effect (meaning this test can be used) for the duration of the COVID-19 declaration under Section 564(b)(1) of the Act, 21 U.S.C.section 360bbb-3(b)(1), unless the authorization is terminated  or revoked sooner.       Influenza A by PCR NEGATIVE NEGATIVE Final   Influenza B by PCR NEGATIVE NEGATIVE Final    Comment: (NOTE) The Xpert Xpress SARS-CoV-2/FLU/RSV plus assay is intended as an aid in the diagnosis of influenza from Nasopharyngeal swab specimens and should not be used as a sole basis for treatment. Nasal washings and aspirates are unacceptable for  Xpert Xpress SARS-CoV-2/FLU/RSV testing.  Fact Sheet for Patients: EntrepreneurPulse.com.au  Fact Sheet for Healthcare Providers: IncredibleEmployment.be  This test is not yet approved or cleared by the [place] FDA and has been authorized for detection and/or diagnosis of SARS-CoV-2 by FDA under an Emergency Use  Authorization (EUA). This EUA will remain in effect (meaning this test can be used) for the duration of the COVID-19 declaration under Section 564(b)(1) of the Act, 21 U.S.C. section 360bbb-3(b)(1), unless the authorization is terminated or revoked.  Performed at [facility], [address] [place], [place] [address]   Blood Culture (routine x 2)     Status: None   Collection Time: 09/09/20 11:46 AM   Specimen: BLOOD  Result Value Ref Range Status   Specimen Description BLOOD LEFT ANTECUBITAL  Final   Special Requests   Final    BOTTLES DRAWN AEROBIC AND ANAEROBIC Blood Culture adequate volume   Culture   Final    NO GROWTH 5 DAYS Performed at [facility], [address] [place], [place] [address]    Report Status 09/14/2020 FINAL  Final  Blood Culture (routine x 2)     Status: None   Collection Time: 09/09/20 11:46 AM   Specimen: BLOOD  Result Value Ref Range Status   Specimen Description BLOOD BLOOD LEFT FOREARM  Final   Special Requests   Final    BOTTLES DRAWN AEROBIC AND ANAEROBIC Blood Culture results may not be optimal due to an inadequate volume of blood received in culture bottles   Culture   Final    NO GROWTH 5 DAYS Performed at [facility], [address] [place], [place] [address]    Report Status 09/14/2020 FINAL  Final  Aerobic/Anaerobic Culture w Gram Stain (surgical/deep wound)     Status: None   Collection Time: 09/09/20  6:30 PM   Specimen: Abscess  Result Value Ref Range Status   Specimen Description   Final    ABSCESS RIGHT FOREARM Performed at [facility], [place] [address] [place], [place] [address]    Special Requests   Final    NONE Performed at [place], [place] [address]    Gram Stain   Final    MODERATE WBC PRESENT, PREDOMINANTLY PMN MODERATE GRAM POSITIVE COCCI IN PAIRS IN CLUSTERS ABUNDANT GRAM NEGATIVE RODS    Culture   Final    ABUNDANT STREPTOCOCCUS CONSTELLATUS ABUNDANT BACTEROIDES UNIFORMIS BETA LACTAMASE NEGATIVE Performed at [facility], [place] [address] [place], [place] [address]    Report Status 09/14/2020 FINAL  Final   Organism ID, Bacteria STREPTOCOCCUS CONSTELLATUS  Final      Susceptibility   Streptococcus constellatus - MIC*    PENICILLIN INTERMEDIATE Intermediate     CEFTRIAXONE 1 SENSITIVE Sensitive     ERYTHROMYCIN <=0.12 SENSITIVE Sensitive     LEVOFLOXACIN 0.5 SENSITIVE Sensitive     VANCOMYCIN 0.5 SENSITIVE Sensitive     * ABUNDANT STREPTOCOCCUS CONSTELLATUS  Fungus Culture With Stain     Status: None (Preliminary result)   Collection Time: 09/09/20  6:38 PM   Specimen: Abscess  Result Value Ref Range Status   Fungus Stain Final report  Final    Comment: (NOTE) Performed At: [facility] [address] [place], [place] [id number] Rush Farmer MD GY:[id number]    Fungus (Mycology) Culture PENDING  Incomplete   Fungal Source ABSCESS  Final    Comment: Performed at [facility], [address] [place], [place] [address]  Fungus Culture Result     Status: None   Collection Time: 09/09/20  6:38 PM  Result Value Ref Range Status   Result 1 Comment  Final    Comment: (NOTE) KOH/Calcofluor preparation:  no fungus  observed. Performed At: [facility] [place], [place] [id number] Rush Farmer MD CN:[id number]   ? Impression/Recommendation ? ?Dog bite infection rt forearm- necrotizing fascitis s/p debridement on 09/09/20 and has a wound vac. Culture streptococcus constellatus and bacteroides unifroms- on  Augmentin_ he will finish 3 week course  ?Rabies vaccine- post exposure vaccination needs 4 doses Day 0,3,7,14. Today will give the 2nd dose in my office ( got it from inpatient pharmacy) Spoke to Health department and they do not give post exposure rabies vaccine 3d dose he will go to [facility] on 09/22/20 and final dose on 09/29/20 Spoke to [facility]- Confirmed they are open on Monday- Gave patient the necessary informaton  DM- on Metformin  HTN- on amlodipine Discussed the management with patient in great detail Told him that he will have to get all 4 doses of vaccine to protect from rabies as we dont know the vaccination status of the stray dog. Pt understands the importance of getting all 4 doses ___________________________________________________

## 2020-09-18 NOTE — Patient Instructions (Signed)
Today you are here to se eme after recent hospitlaizarion for a dog bite- You are on Augmentin You received the first dose of Rabies vaccine in the ED [facility]. Today I gave you the 2nd dose in my clinic- ( [place] )your 3 rd dose is on July 4th and your final dose is on July 11th You can go to [facility] center at [place], [place], [place] [address] Hours:  Open ? Monday 8am-8Pm Phone: [phone]

## 2020-09-19 ENCOUNTER — Other Ambulatory Visit: Payer: Self-pay | Admitting: *Deleted

## 2020-09-19 DIAGNOSIS — Z419 Encounter for procedure for purposes other than remedying health state, unspecified: Secondary | ICD-10-CM | POA: Diagnosis not present

## 2020-09-19 DIAGNOSIS — W540XXA Bitten by dog, initial encounter: Secondary | ICD-10-CM | POA: Diagnosis not present

## 2020-09-19 DIAGNOSIS — S51851A Open bite of right forearm, initial encounter: Secondary | ICD-10-CM | POA: Diagnosis not present

## 2020-09-19 NOTE — Patient Outreach (Addendum)
Medicaid Managed Care   Nurse Care Manager Note  09/19/2020 Name:  Jimmy Bishop MRN:  [id number] DOB:  [birth year]  Jimmy Bishop is an 60 y.o. year old male who is a primary patient of [place]  The [facility] Managed Care Coordination team was consulted for assistance with:    Wound care  Jimmy Bishop's DPR/mother, Jimmy Bishop was given information about Medicaid Managed Care Coordination team services today. Jimmy Bishop DPR agreed to services and verbal consent obtained.  Engaged with patient's DPR by telephone for follow up visit in response to provider referral for case management and/or care coordination services.   Assessments/Interventions:  Review of past medical history, allergies, medications, health status, including review of consultants reports, laboratory and other test data, was performed as part of comprehensive evaluation and provision of chronic care management services.  SDOH (Social Determinants of Health) assessments and interventions performed:   Care Plan  Allergies  Allergen Reactions   Vicodin [Hydrocodone-Acetaminophen] Hives and Rash    Medications Reviewed Today     Reviewed by Tsosie Billing, MD (Physician) on 09/18/20 at 2246  Med List Status: <None>   Medication Order Taking? Sig Documenting Provider Last Dose Status Informant  albuterol (PROVENTIL HFA;VENTOLIN HFA) 108 (90 Base) MCG/ACT inhaler [id number] Yes Inhale 2 puffs into the lungs every 6 (six) hours as needed for wheezing or shortness of breath. Jimmy Hai, PA-C Taking Active Self  amLODipine (NORVASC) 10 MG tablet [id number] Yes Take 1 tablet by mouth daily. [provider] Taking Active Self  amoxicillin-clavulanate (AUGMENTIN) 875-125 MG tablet [id number] Yes Take 1 tablet by mouth 2 (two) times daily for 21 days. Jimmy Friendly, MD Taking Active   bisacodyl (DULCOLAX) 5 MG EC tablet [id number] Yes Take 1 tablet (5 mg total) by mouth daily for 14  days. Jimmy Friendly, MD Taking Active   cetirizine (ZYRTEC) 10 MG tablet [id number] Yes Take 1 tablet (10 mg total) by mouth daily. Jimmy Friendly, MD Taking Active   Cholecalciferol (VITAMIN D3) 1.25 MG (50000 UT) CAPS [id number] Yes Take 1 capsule by mouth once a week. [provider] Taking Active Self  ferrous sulfate 325 (65 FE) MG tablet [id number] Yes Take 1 tablet by mouth daily. [provider] Taking Active Self  FLOVENT HFA 220 MCG/ACT inhaler [id number] Yes Inhale 2 puffs into the lungs 2 (two) times daily. [provider] Taking Active Self  lisinopril (ZESTRIL) 20 MG tablet [id number] Yes Take 1 tablet by mouth daily. [provider] Taking Active Self  metFORMIN (GLUCOPHAGE) 500 MG tablet [id number] Yes Take 500 mg by mouth 2 (two) times daily with a meal. [provider] Taking Active   oxyCODONE-acetaminophen (PERCOCET) 5-325 MG tablet [id number] Yes Take 1 tablet by mouth every 6 (six) hours as needed for severe pain. Jimmy Pun, MD Taking Active   STIOLTO RESPIMAT 2.5-2.5 MCG/ACT AERS [id number] Yes SMARTSIG:2 Puff(s) Via Inhaler Every Morning [provider] Taking Active Self            Patient Active Problem List   Diagnosis Date Noted   Right forearm cellulitis 09/09/2020   Dog bite 09/09/2020   COPD (chronic obstructive pulmonary disease) ([place]) 09/09/2020   Abscess of right forearm 09/09/2020   Hypertension    Severe sepsis (Jimmy Bishop)    HLD (hyperlipidemia)    Tobacco abuse    Diabetes mellitus without complication (Jimmy Bishop)    S/P cervical spinal fusion 04/18/2015    Conditions to be addressed/monitored  per PCP order:   wound care  Care Plan : Wound Care and Self Health management Needs  Updates made by Jimmy Montane, RN since 09/19/2020 12:00 AM     Problem: Knowledge Deficits and Self Health Management Deficits Related To Wound Care   Priority: High     Long-Range Goal: Wound Care  Knowledge and Independent Self Management Achieved   Start Date: 09/12/2020  Expected End Date: 10/20/2020  This Visit's Progress: On track  Recent Progress: On track  Priority: High  Note:   Current Barriers:  Ineffective Self Health Maintenance - hospitalized 09/09/20 with sepsis and forearm wound requiring surgical I&D; anticipated hospital d/c next 48 hours; family care and involvement required for wound care; per EMR niece Jimmy Bishop [phone] may be able to provide assistance at home in addition to mother Jimmy Bishop [phone]. Today, DPR/mother reports that Jimmy Bishop has everything that he needs. He is going to [facility] twice weekly for dressing changes and taking his antibiotic. She was unaware that he needs dose 3 and 4 of the rabies vaccine. Jimmy Bishop reports that the dog was picked up after patient hospitalized and [facility] reports the dog was negative for rabies. Unable to independently perform wound care Currently UNABLE TO independently self manage needs related to chronic health conditions.  Knowledge Deficits related to short term plan for care coordination needs and long term plans for chronic disease management needs Nurse Case Manager Clinical Goal(s):  patient will work with care management team to address care coordination and chronic disease management needs related to Educational Needs Care Coordination Other wound care    Interventions:  Evaluation of current treatment plan related to wound care/sepsis and patient's adherence to plan as established by provider. Collaborated with Dr. Delaine Lame for guidance regarding rabies vaccine Discussed with DPR/mother the recommendation of Dr. Delaine Lame to continue with Rabies vaccine dose #3 and 4. Provided location and details for [facility] [place], [place], phone number [phone] rabies vaccine Discussed plans with patient for ongoing care management follow up and provided patient with  direct contact information for care management team Patient Goals: - Patient and family will work with hospital transitions team and High-Risk Managed Medicaid RNCM post-d/c - attend all scheduled appointments for dressing changes and dose 3 and 4 of rabies vaccine - ask family or friend for a ride - call to cancel if needed - keep a calendar with prescription refill dates - keep a calendar with appointment dates  Follow Up Plan: Telephone follow up appointment with care management team member scheduled for:09/30/20 @ 11:30am      Follow Up:  Patient's DPR agrees to Care Plan and Follow-up.  Plan: The Managed Medicaid care management team will reach out to the patient again over the next 14 days.  Date/time of next scheduled RN care management/care coordination outreach:  09/30/20 @ 11:30am  Lurena Joiner RN, BSN [place]  [employer] manager

## 2020-09-19 NOTE — Patient Instructions (Addendum)
Visit Information  [name] was given information about Medicaid Managed Care team care coordination services as a part of their [facility] Medicaid benefit. Jimmy Bishop verbally consented to engagement with the [facility] Managed Care team.   For questions related to your [facility] health plan, please call: [phone] or go here:[email or web address]  If you would like to schedule transportation through your [facility] plan, please call the following number at least 2 days in advance of your appointment: [phone].  Call the [place] at [phone], at any time, 24 hours a day, 7 days a week. If you are in danger or need immediate medical attention call 911.  [name] - following are the goals we discussed in your visit today:   Goals Addressed             This Visit's Progress    Make and Keep All Appointments       Timeframe:  Short-Term Goal Priority:  High Start Date: 09/19/20                            Expected End Date:   10/20/20                    Follow Up Date 09/30/20    - attend all scheduled appointments for dressing changes and dose 3 and 4 of rabies vaccine - ask family or friend for a ride - call to cancel if needed - keep a calendar with prescription refill dates - keep a calendar with appointment dates    Why is this important?   Part of staying healthy is seeing the doctor for follow-up care.  If you forget your appointments, there are some things you can do to stay on track.             Please see education materials related to wound care provided as print materials.   The patient verbalized understanding of instructions provided today and agreed to receive a mailed copy of patient instruction and/or educational materials.  Telephone follow up appointment with Managed Medicaid care management team member scheduled for:09/30/20 @ 11:30am  Jimmy Joiner RN, [place] RN Care Coordinator   Following is a copy of your plan of care:  Patient Care Plan: Wound Care and Self Health management Needs     Problem Identified: Knowledge Deficits and Self Health Management Deficits Related To Wound Care   Priority: High     Long-Range Goal: Wound Care Knowledge and Independent Self Management Achieved   Start Date: 09/12/2020  Expected End Date: 10/20/2020  This Visit's Progress: On track  Recent Progress: On track  Priority: High  Note:   Current Barriers:  Ineffective Self Health Maintenance - hospitalized 09/09/20 with sepsis and forearm wound requiring surgical I&D; anticipated hospital d/c next 48 hours; family care and involvement required for wound care; per EMR niece Jimmy Bishop [phone] may be able to provide assistance at home in addition to mother Jimmy Bishop [phone]. Today, DPR/mother reports that Jimmy Bishop has everything that he needs. He is going to [facility] twice weekly for dressing changes and taking his antibiotic. She was unaware that he needs dose 3 and 4 of the rabies vaccine. Jimmy Bishop reports that the dog was picked up after patient hospitalized and [facility] reports the dog was negative for rabies. Unable to independently perform wound care Currently UNABLE TO independently self manage  needs related to chronic health conditions.  Knowledge Deficits related to short term plan for care coordination needs and long term plans for chronic disease management needs Nurse Case Manager Clinical Goal(s):  patient will work with care management team to address care coordination and chronic disease management needs related to Educational Needs Care Coordination Other wound care    Interventions:  Evaluation of current treatment plan related to wound care/sepsis and patient's adherence to plan as established by provider. Collaborated with Jimmy Bishop for guidance regarding rabies vaccine Discussed with DPR/mother the  recommendation of Jimmy Bishop to continue with Rabies vaccine dose #3 and 4. Provided location and details for [facility] [place], [place], phone number [phone] rabies vaccine Discussed plans with patient for ongoing care management follow up and provided patient with direct contact information for care management team Patient Goals: - Patient and family will work with hospital transitions team and High-Risk Managed Medicaid RNCM post-d/c - attend all scheduled appointments for dressing changes and dose 3 and 4 of rabies vaccine - ask family or friend for a ride - call to cancel if needed - keep a calendar with prescription refill dates - keep a calendar with appointment dates  Follow Up Plan: Telephone follow up appointment with care management team member scheduled for:09/30/20 @ 11:30am

## 2020-09-23 DIAGNOSIS — S51851A Open bite of right forearm, initial encounter: Secondary | ICD-10-CM | POA: Diagnosis not present

## 2020-09-23 DIAGNOSIS — W540XXA Bitten by dog, initial encounter: Secondary | ICD-10-CM | POA: Diagnosis not present

## 2020-09-26 NOTE — Progress Notes (Addendum)
Patient discharged 6/26. Received VM from [facility] that patient needs more wound vac supplies.  Reached out to [place] with [employer] and asked him to follow up with [facility]. Also provided # for [employer] to [facility] at [facility] via VM.  Oleh Genin, [place]

## 2020-09-30 ENCOUNTER — Other Ambulatory Visit: Payer: Self-pay

## 2020-09-30 ENCOUNTER — Other Ambulatory Visit: Payer: Self-pay | Admitting: *Deleted

## 2020-09-30 ENCOUNTER — Telehealth: Payer: Self-pay

## 2020-09-30 NOTE — Patient Instructions (Signed)
Visit Information  Jimmy Bishop was given information about Medicaid Managed Care team care coordination services as a part of their [facility] Medicaid benefit. Jimmy Bishop verbally consented to engagement with the [facility] Managed Care team.   For questions related to your [facility] health plan, please call: [phone] or go here:[email or web address]  If you would like to schedule transportation through your [facility] plan, please call the following number at least 2 days in advance of your appointment: [phone].  Call the [place] at [phone], at any time, 24 hours a day, 7 days a week. If you are in danger or need immediate medical attention call 911.  If you would like help to quit smoking, call 1-800-QUIT-NOW 575-264-5389) OR Espaol: 1-855-Djelo-Ya (8-676-195-0932) o para ms informacin haga clic aqu or Text READY to 200-400 to register via text  Jimmy Bishop - following are the goals we discussed in your visit today:   Goals Addressed             This Visit's Progress    COMPLETED: Make and Keep All Appointments       Timeframe:  Short-Term Goal Priority:  High Start Date: 09/19/20                            Expected End Date:   09/30/20                    Follow Up Date 09/30/20    - attend all scheduled appointments for dressing changes and dose 3 and 4 of rabies vaccine - ask family or friend for a ride - call to cancel if needed - keep a calendar with prescription refill dates - keep a calendar with appointment dates    Why is this important?   Part of staying healthy is seeing the doctor for follow-up care.  If you forget your appointments, there are some things you can do to stay on track.             Please see education materials related to wound care provided as print materials.   The patient verbalized understanding of instructions provided today and agreed to receive a mailed copy of patient  instruction and/or educational materials.  No further follow up required:    Jimmy Bishop, Jimmy Bishop   Following is a copy of your plan of care:  Patient Care Plan: Wound Care and Self Health management Needs     Problem Identified: Knowledge Deficits and Self Health Management Deficits Related To Wound Care Resolved 09/30/2020  Priority: High     Long-Range Goal: Wound Care Knowledge and Independent Self Management Achieved Completed 09/30/2020  Start Date: 09/12/2020  Expected End Date: 09/30/2020  This Visit's Progress: On track  Recent Progress: On track  Priority: High  Note:   Current Barriers:  Ineffective Self Health Maintenance - hospitalized 09/09/20 with sepsis and forearm wound requiring surgical I&D; anticipated hospital d/c next 48 hours; family care and involvement required for wound care; per EMR niece Jimmy Bishop [phone] may be able to provide assistance at home in addition to mother Jimmy Bishop [phone]. Today, DPR/mother reports that Jimmy Bishop has everything that he needs. He is going to [facility] twice weekly for dressing changes and taking his antibiotic. She was unaware that he needs dose 3 and 4 of the rabies vaccine. Jimmy Bishop reports that the dog  was picked up after patient hospitalized and [facility] reports the dog was negative for rabies.-Update-RNCM spoke with DPR/mother, Jimmy Bishop, who states that Jimmy Bishop does not like to talk on the phone. His wound care is being managed twice a week in the clinic and is healing nicely. Jimmy Bishop is continuing to take his antibiotic and declines completeing the rabies vaccine. Patient is aware of Dr. Gwenevere Ghazi recommendation to complete dose #3 and 4 of the vaccine. Jimmy Bishop reports not other needs at this time and she will contact RNCM should any needs arise. Unable to independently perform wound care Currently UNABLE TO independently self manage needs  related to chronic health conditions.  Knowledge Deficits related to short term plan for care coordination needs and long term plans for chronic disease management needs Nurse Case Manager Clinical Goal(s):  patient will work with care management team to address care coordination and chronic disease management needs related to Educational Needs Care Coordination Other wound care    Interventions:  Evaluation of current treatment plan related to wound care/sepsis and patient's adherence to plan as established by provider. Discussed plans with patient for ongoing care management follow up and provided patient with direct contact information for care management team Encouraged patient's DPR/mother to contact RNCM should any barriers to managing Jimmy Bishop's healthcare arise Patient Goals: - Patient and family will work with hospital transitions team and High-Risk Managed Medicaid RNCM post-d/c - attend all scheduled appointments for dressing changes and dose 3 and 4 of rabies vaccine - ask family or friend for a ride - call to cancel if needed - keep a calendar with prescription refill dates - keep a calendar with appointment dates  Follow Up Plan: The patient has been provided with contact information for the care management team and has been advised to call with any health related questions or concerns.  No further follow up required

## 2020-09-30 NOTE — Telephone Encounter (Signed)
Attempted to reach on all numbers and no vm available. Need to discuss if he got his dose 3 and 4 Rabies vaccine from [facility].

## 2020-09-30 NOTE — Patient Outreach (Signed)
Medicaid Managed Care   Nurse Care Manager Note  09/30/2020 Name:  Jimmy Bishop MRN:  [id number] DOB:  18-May-1960  Jimmy Bishop is an 60 y.o. year old male who is a primary patient of [place]  The [facility] Managed Care Coordination team was consulted for assistance with:    Wound care  Jimmy Bishop was given information about Medicaid Managed Care Coordination team services today. Jimmy Bishop agreed to services and verbal consent obtained.  Engaged with patient by telephone for follow up visit in response to provider referral for case management and/or care coordination services.   Assessments/Interventions:  Review of past medical history, allergies, medications, health status, including review of consultants reports, laboratory and other test data, was performed as part of comprehensive evaluation and provision of chronic care management services.  SDOH (Social Determinants of Health) assessments and interventions performed: SDOH Interventions    Flowsheet Row Most Recent Value  SDOH Interventions   Food Insecurity Interventions Intervention Not Indicated  Housing Interventions Intervention Not Indicated  Transportation Interventions Intervention Not Indicated       Care Plan  Allergies  Allergen Reactions   Vicodin [Hydrocodone-Acetaminophen] Hives and Rash    Medications Reviewed Today     Reviewed by Melissa Montane, RN (Registered Nurse) on 09/30/20 at 1139  Med List Status: <None>   Medication Order Taking? Sig Documenting Provider Last Dose Status Informant  albuterol (PROVENTIL HFA;VENTOLIN HFA) 108 (90 Base) MCG/ACT inhaler [id number] Yes Inhale 2 puffs into the lungs every 6 (six) hours as needed for wheezing or shortness of breath. [name], PA-C Taking Active Self  amLODipine (NORVASC) 10 MG tablet [id number] Yes Take 1 tablet by mouth daily. [provider] Taking Active Self  amoxicillin-clavulanate (AUGMENTIN) 875-125  MG tablet [id number] Yes Take 1 tablet by mouth 2 (two) times daily for 21 days. Alma Friendly, MD Taking Active   cetirizine (ZYRTEC) 10 MG tablet [id number] Yes Take 1 tablet (10 mg total) by mouth daily. Alma Friendly, MD Taking Active   Cholecalciferol (VITAMIN D3) 1.25 MG (50000 UT) CAPS [id number] Yes Take 1 capsule by mouth once a week. [provider] Taking Active Self  ferrous sulfate 325 (65 FE) MG tablet [id number] Yes Take 1 tablet by mouth daily. [provider] Taking Active Self  FLOVENT HFA 220 MCG/ACT inhaler [id number] Yes Inhale 2 puffs into the lungs 2 (two) times daily. [provider] Taking Active Self  lisinopril (ZESTRIL) 20 MG tablet [id number] Yes Take 1 tablet by mouth daily. [provider] Taking Active Self  metFORMIN (GLUCOPHAGE) 500 MG tablet [id number] Yes Take 500 mg by mouth 2 (two) times daily with a meal. [provider] Taking Active   oxyCODONE-acetaminophen (PERCOCET) 5-325 MG tablet [id number] Yes Take 1 tablet by mouth every 6 (six) hours as needed for severe pain. [name], MD Taking Active   STIOLTO RESPIMAT 2.5-2.5 MCG/ACT AERS [id number] Yes SMARTSIG:2 Puff(s) Via Inhaler Every Morning [provider] Taking Active Self            Patient Active Problem List   Diagnosis Date Noted   Right forearm cellulitis 09/09/2020   Dog bite 09/09/2020   COPD (chronic obstructive pulmonary disease) ([place]) 09/09/2020   Abscess of right forearm 09/09/2020   Hypertension    Severe sepsis ([place])    HLD (hyperlipidemia)    Tobacco abuse    Diabetes mellitus without complication ([place])    S/P cervical spinal fusion 04/18/2015  Conditions to be addressed/monitored per PCP order:   wound care  Care Plan : Wound Care and Self Health management Needs  Updates made by Melissa Montane, RN since 09/30/2020 12:00 AM     Problem: Knowledge Deficits and Self Health Management Deficits Related  To Wound Care Resolved 09/30/2020  Priority: High     Long-Range Goal: Wound Care Knowledge and Independent Self Management Achieved Completed 09/30/2020  Start Date: 09/12/2020  Expected End Date: 09/30/2020  This Visit's Progress: On track  Recent Progress: On track  Priority: High  Note:   Current Barriers:  Ineffective Self Health Maintenance - hospitalized 09/09/20 with sepsis and forearm wound requiring surgical I&D; anticipated hospital d/c next 48 hours; family care and involvement required for wound care; per EMR niece Corinne Ports [phone] may be able to provide assistance at home in addition to mother Jimmy Bishop [phone]. Today, DPR/mother reports that Jimmy Bishop has everything that he needs. He is going to [facility] twice weekly for dressing changes and taking his antibiotic. She was unaware that he needs dose 3 and 4 of the rabies vaccine. Charlett Nose reports that the dog was picked up after patient hospitalized and [facility] reports the dog was negative for rabies.-Update-RNCM spoke with DPR/mother, Jimmy Bishop, who states that Jimmy Bishop does not like to talk on the phone. His wound care is being managed twice a week in the clinic and is healing nicely. Jimmy Bishop is continuing to take his antibiotic and declines completeing the rabies vaccine. Patient is aware of Dr. Gwenevere Ghazi recommendation to complete dose #3 and 4 of the vaccine. Charlett Nose reports not other needs at this time and she will contact RNCM should any needs arise. Unable to independently perform wound care Currently UNABLE TO independently self manage needs related to chronic health conditions.  Knowledge Deficits related to short term plan for care coordination needs and long term plans for chronic disease management needs Nurse Case Manager Clinical Goal(s):  patient will work with care management team to address care coordination and chronic disease management needs related to Educational Needs Care  Coordination Other wound care    Interventions:  Evaluation of current treatment plan related to wound care/sepsis and patient's adherence to plan as established by provider. Discussed plans with patient for ongoing care management follow up and provided patient with direct contact information for care management team Encouraged patient's DPR/mother to contact RNCM should any barriers to managing Mr. Brotzman's healthcare arise Patient Goals: - Patient and family will work with hospital transitions team and High-Risk Managed Medicaid RNCM post-d/c - attend all scheduled appointments for dressing changes and dose 3 and 4 of rabies vaccine - ask family or friend for a ride - call to cancel if needed - keep a calendar with prescription refill dates - keep a calendar with appointment dates  Follow Up Plan: The patient has been provided with contact information for the care management team and has been advised to call with any health related questions or concerns.  No further follow up required      Follow Up:  Patient agrees to Care Plan and Follow-up.  Plan: The patient has been provided with contact information for the Managed Medicaid care management team and has been advised to call with any health related questions or concerns.  Date/time of next scheduled RN care management/care coordination outreach:  no follow up needed  Lurena Joiner RN, [place] RN Care Coordinator

## 2020-10-07 DIAGNOSIS — W540XXA Bitten by dog, initial encounter: Secondary | ICD-10-CM | POA: Diagnosis not present

## 2020-10-07 DIAGNOSIS — S51851A Open bite of right forearm, initial encounter: Secondary | ICD-10-CM | POA: Diagnosis not present

## 2020-10-09 LAB — FUNGUS CULTURE WITH STAIN

## 2020-10-09 LAB — FUNGUS CULTURE RESULT

## 2020-10-09 LAB — FUNGAL ORGANISM REFLEX

## 2020-12-04 DIAGNOSIS — E559 Vitamin D deficiency, unspecified: Secondary | ICD-10-CM | POA: Diagnosis not present

## 2020-12-04 DIAGNOSIS — M25551 Pain in right hip: Secondary | ICD-10-CM | POA: Diagnosis not present

## 2020-12-04 DIAGNOSIS — Z7185 Encounter for immunization safety counseling: Secondary | ICD-10-CM | POA: Diagnosis not present

## 2020-12-04 DIAGNOSIS — E118 Type 2 diabetes mellitus with unspecified complications: Secondary | ICD-10-CM | POA: Diagnosis not present

## 2020-12-04 DIAGNOSIS — E782 Mixed hyperlipidemia: Secondary | ICD-10-CM | POA: Diagnosis not present

## 2020-12-04 DIAGNOSIS — I10 Essential (primary) hypertension: Secondary | ICD-10-CM | POA: Diagnosis not present

## 2021-08-24 IMAGING — CR DG CHEST 2V
2 series · 3 of 3 positions shown · non-contrast
Comparison: 02/01/2019

CLINICAL DATA: Chest pain, numbness in both arms and shortness of
breath

EXAM:
CHEST - 2 VIEW

[Series 2: chest lat · 0.14mm/px · 2 of 2 slices shown]
[im 1/2]
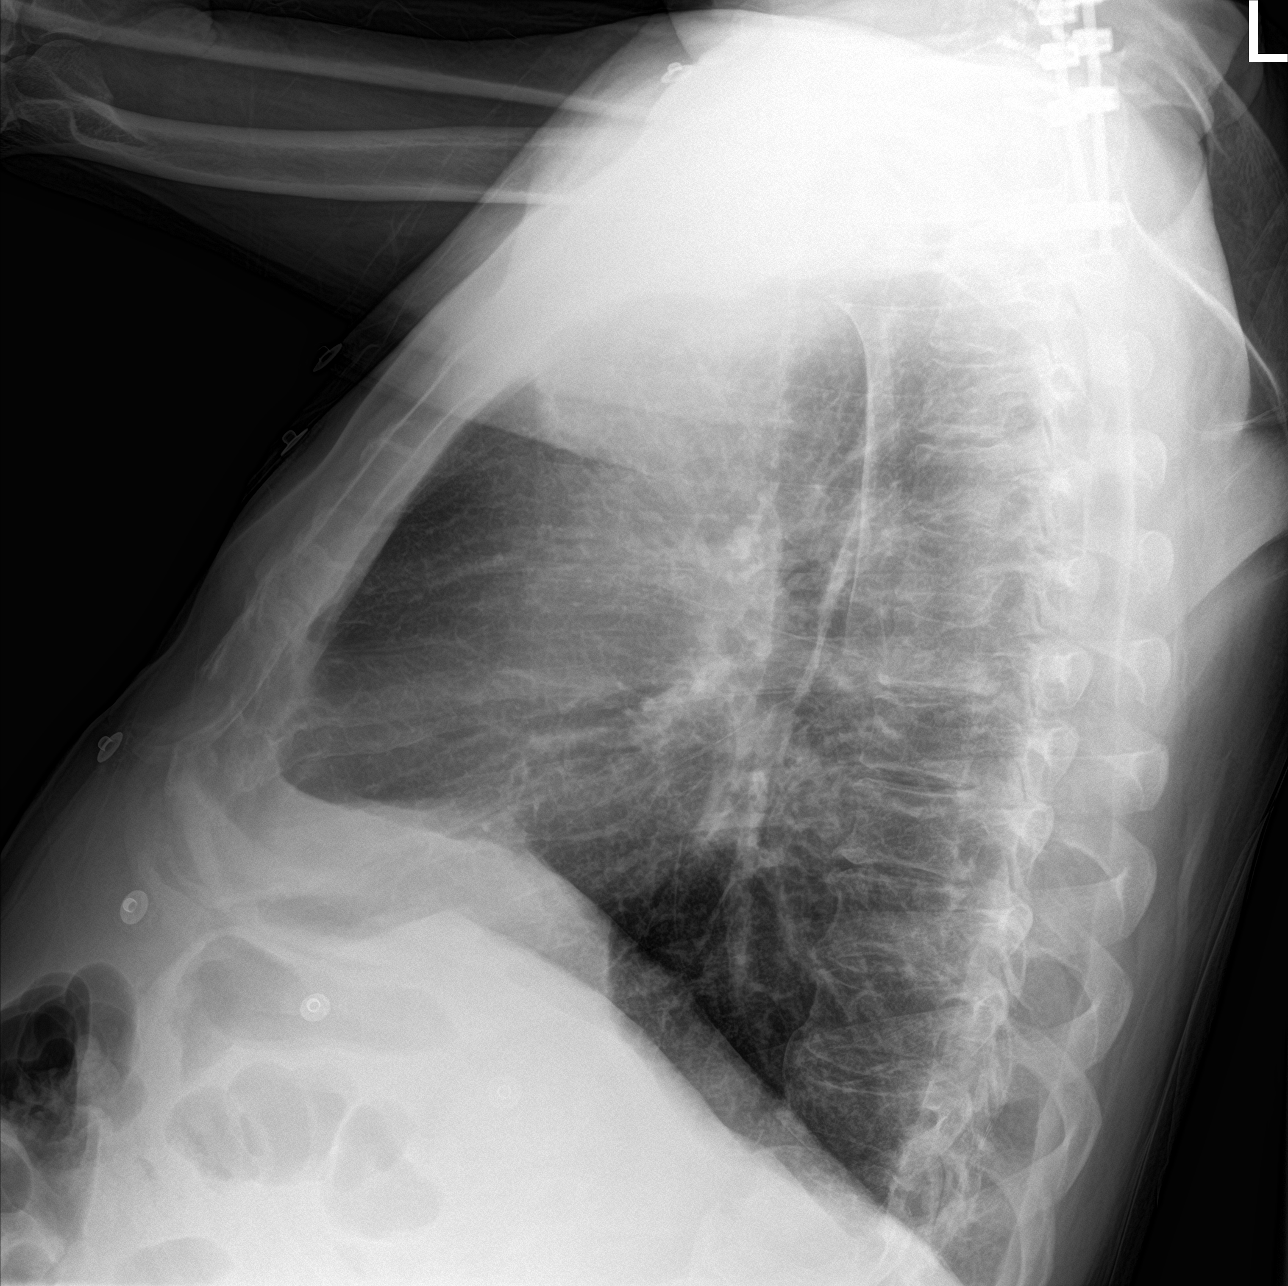
[im 2/2]
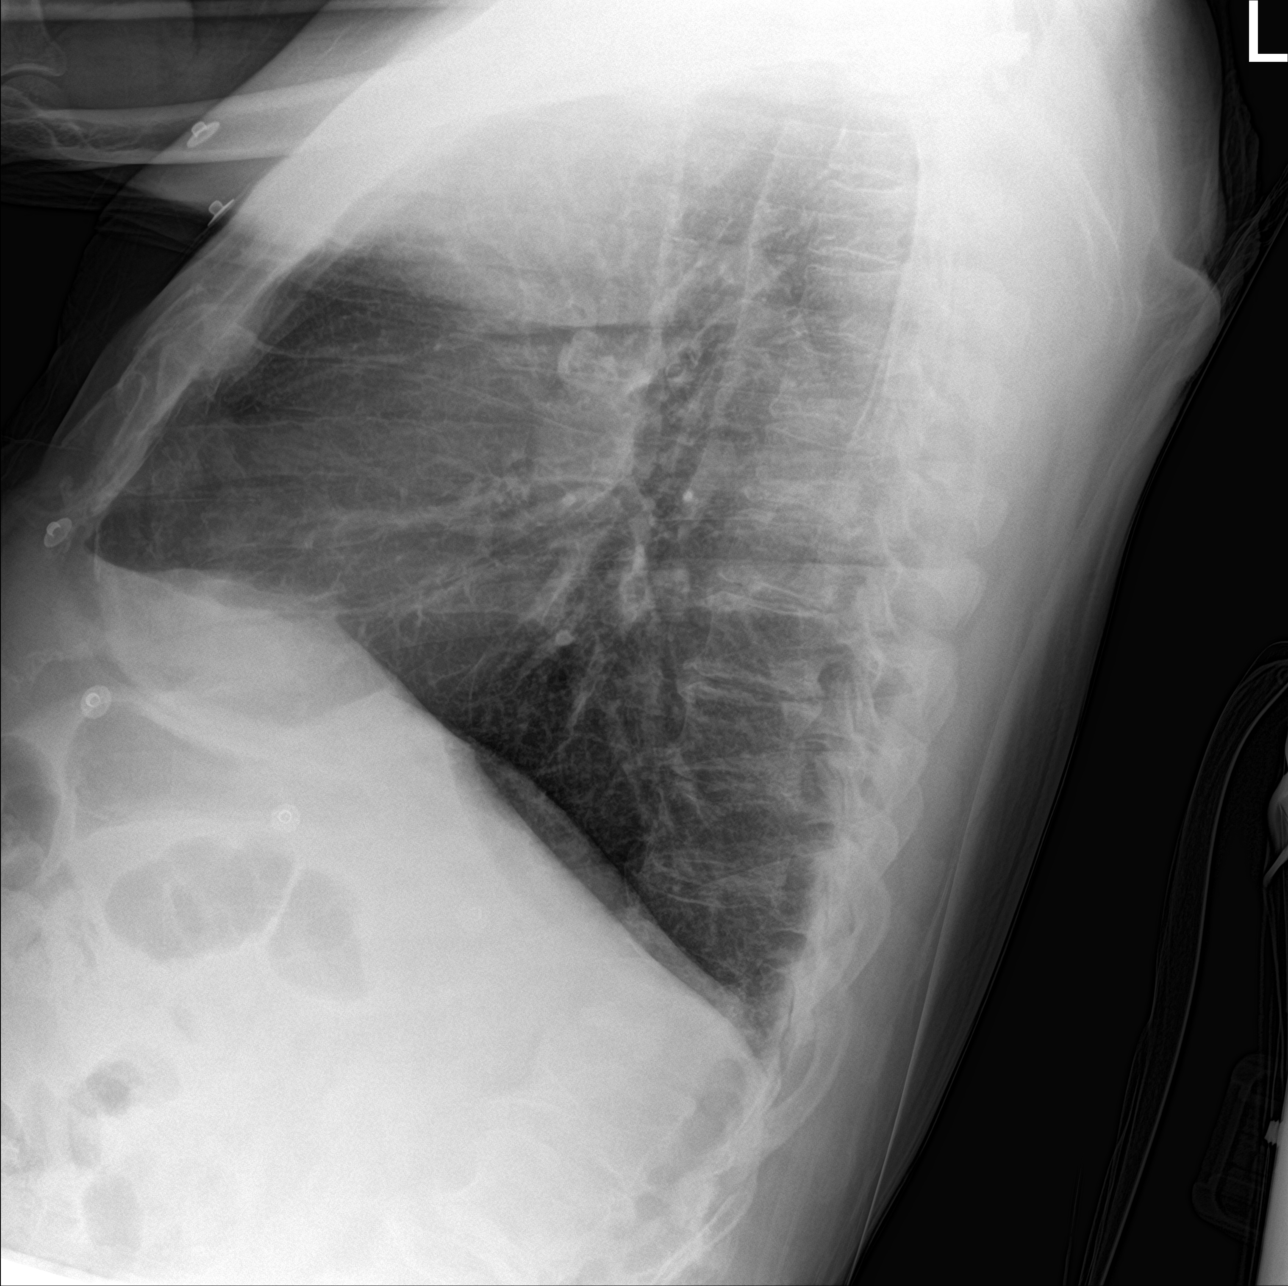

[chest ap]
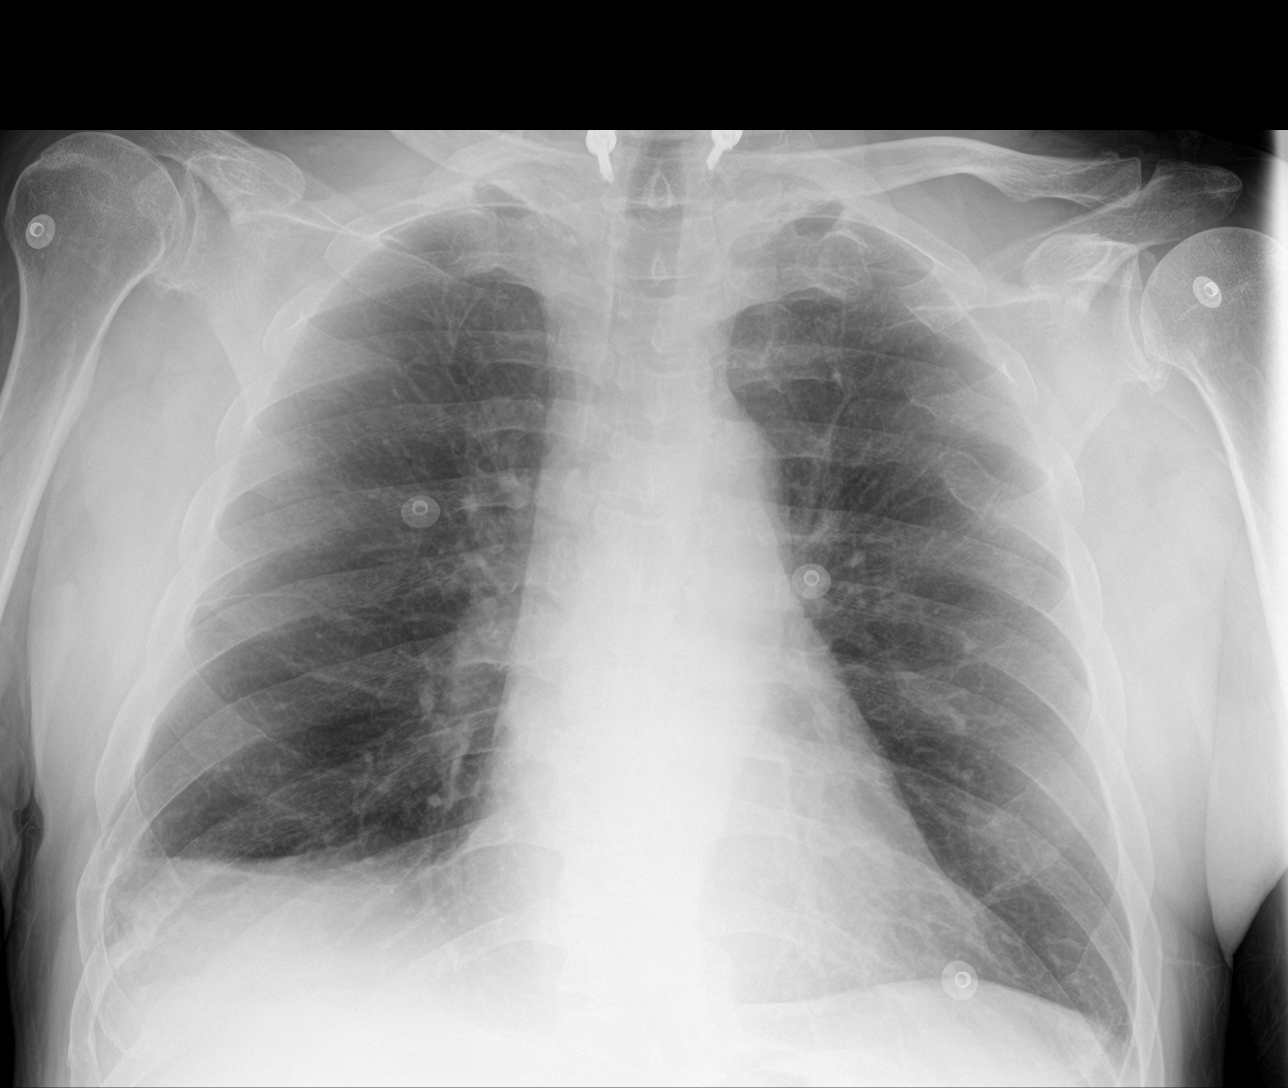

[3 of 3 positions shown; findings below may reference images not displayed]

FINDINGS: Trachea is midline. Cardiomediastinal contours and hilar structures
are stable.

Blunting of the RIGHT costodiaphragmatic sulcus is noted with
obscured RIGHT hemidiaphragm.

RIGHT-sided rib fractures were present previously and are again
noted with some callus formation.

Signs of LEFT-sided rib fractures with chronic appearance also noted
on prior exam.

No effusion.

Lower thoracic compression fractures as before.

Incidental note is again made of cervical spine fusion.
IMPRESSION: New blunting of RIGHT costodiaphragmatic sulcus may represent
developing infection, no effusion seen on lateral view. Pleural and
parenchymal scarring after previous rib fracture is also considered.
Correlate with any pain in this area or pleuritic/respiratory type
symptoms.

## 2021-08-24 IMAGING — CT CT HEAD W/O CM
4 series · 16 of 47 positions shown, 18 images · non-contrast
Comparison: None

CLINICAL DATA: Chest pain, numbness and tingling in both arms,
cannot feel leg, question stroke, history hypertension, smoker

EXAM:
CT HEAD WITHOUT CONTRAST
TECHNIQUE: Contiguous axial images were obtained from the base of the skull
through the vertex without intravenous contrast.

[Series 3: head without · axial · non-contrast · 0.44mm/px · z∈[-101,+19]mm · 7 of 32 slices shown, 9 images]
[im 4/32  brain]
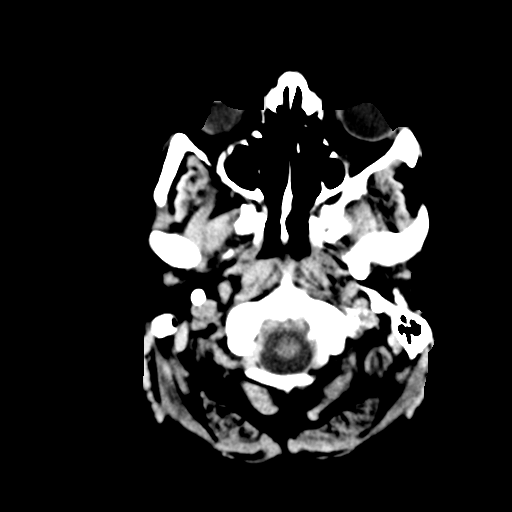
[im 4/32  bone]
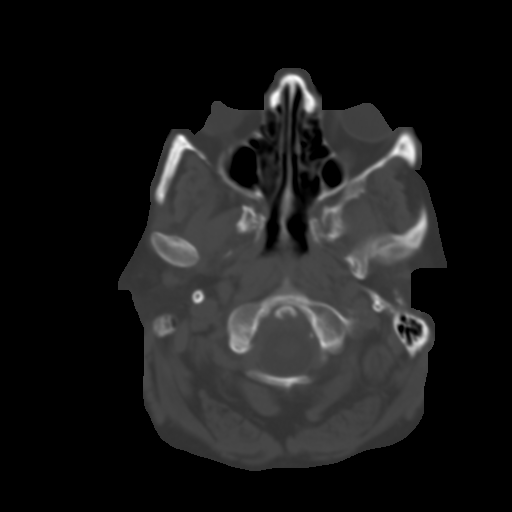
[im 8/32  brain]
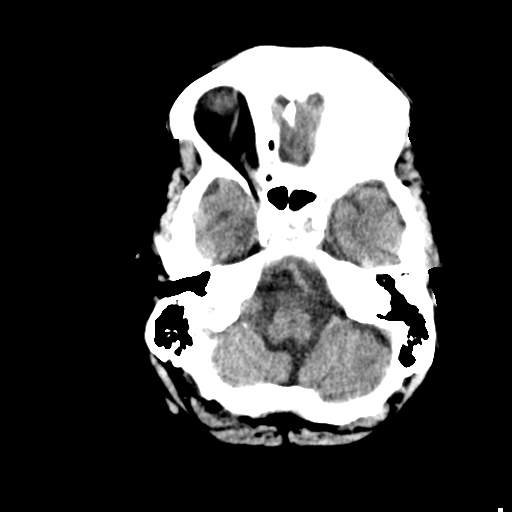
[im 12/32  brain]
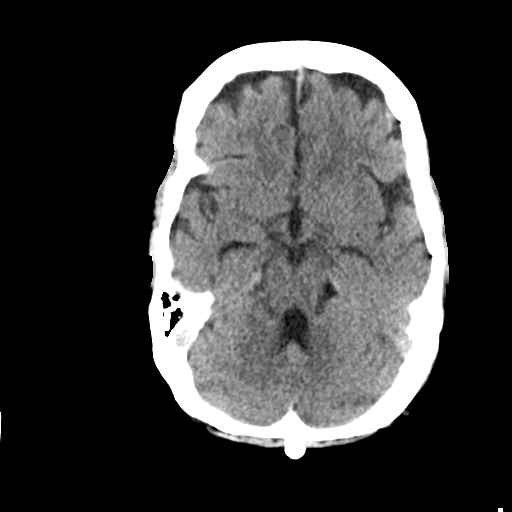
[im 16/32  brain]
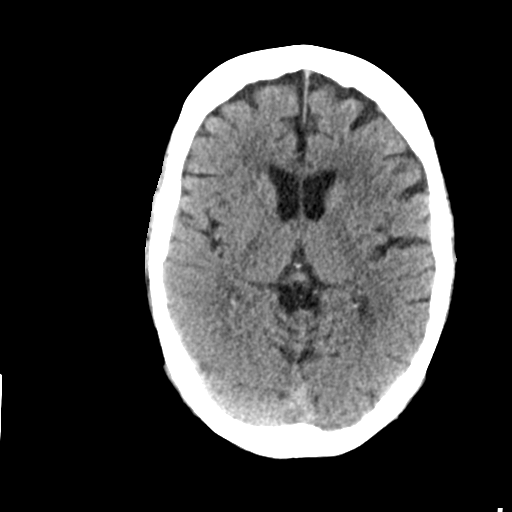
[im 20/32  brain]
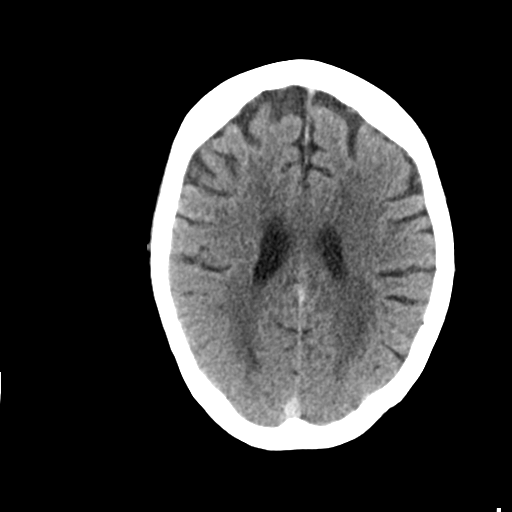
[im 20/32  bone]
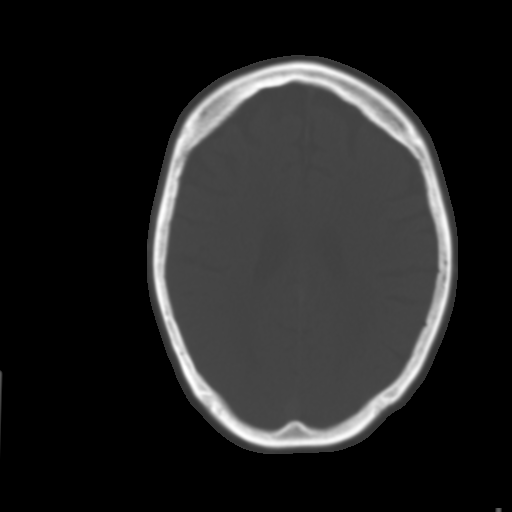
[im 24/32  brain]
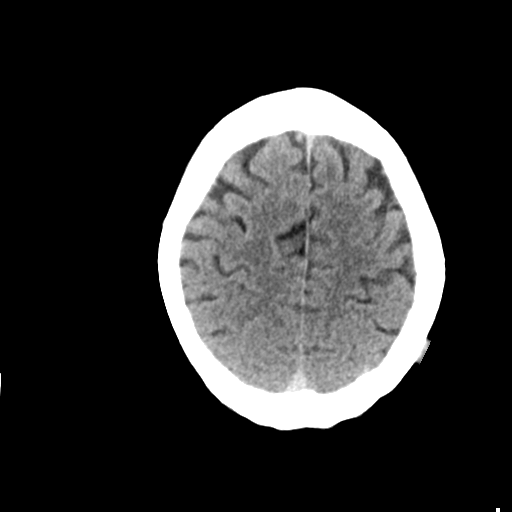
[im 28/32  brain]
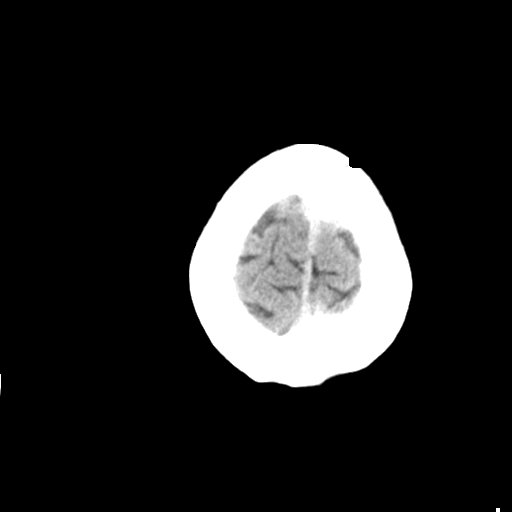

[Series 4: head bone · axial · 0.44mm/px · z∈[-102,-70]mm · 3 of 80 slices shown]
[im 8/80  bone]
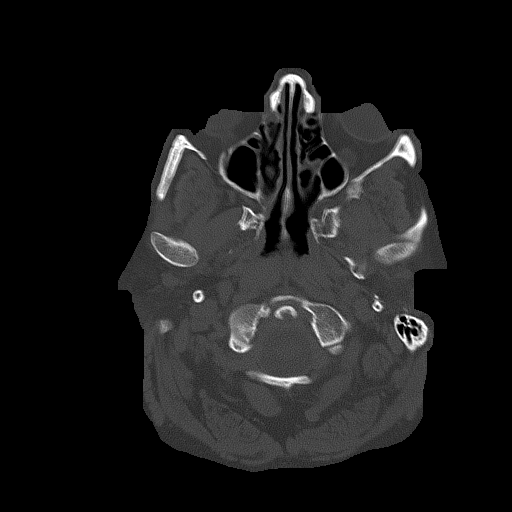
[im 16/80  bone]
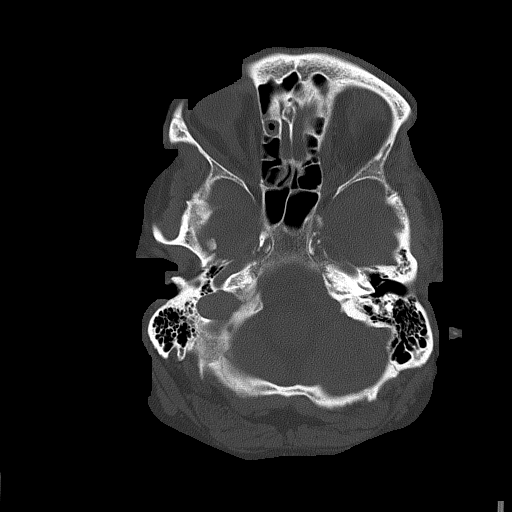
[im 24/80  bone]
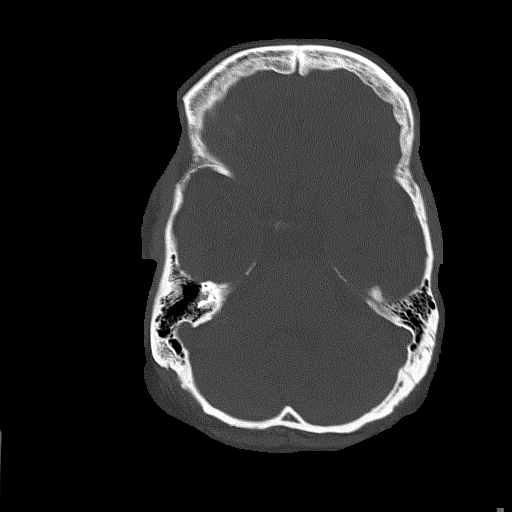

[Series 5: head without cor · coronal · non-contrast · 0.43mm/px · 3 of 76 slices shown]
[im 26/76  brain]
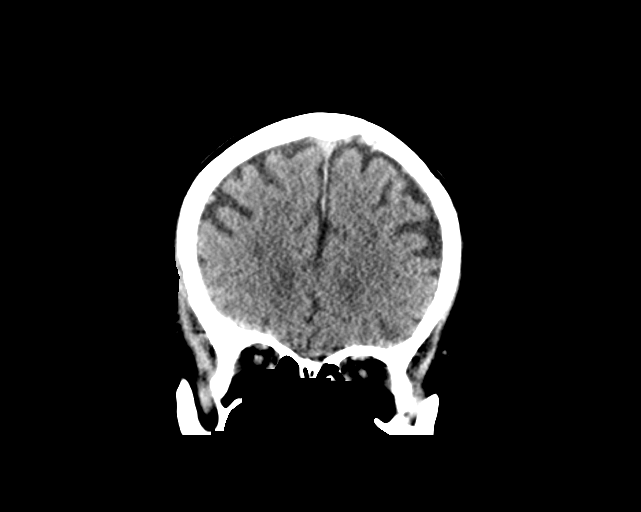
[im 34/76  brain]
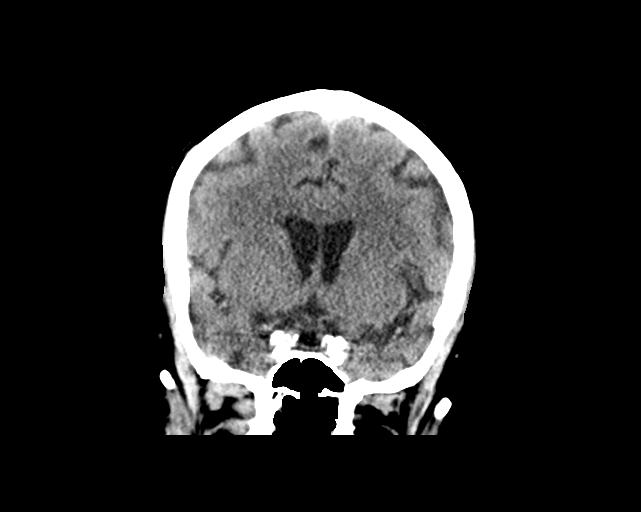
[im 42/76  brain]
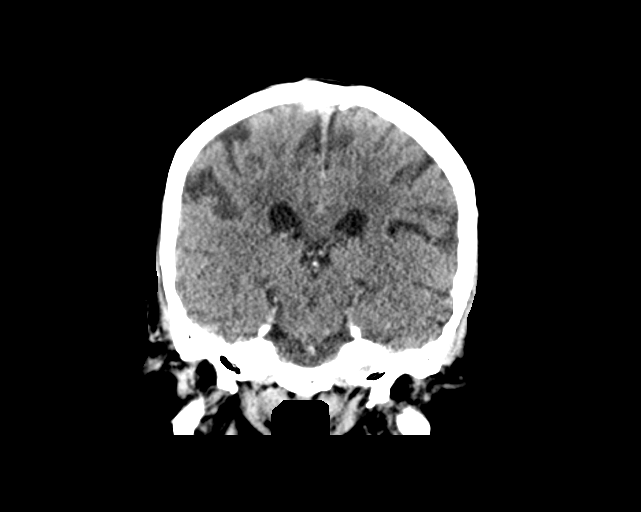

[Series 6: head without sag · sagittal · non-contrast · 0.38mm/px · 3 of 67 slices shown]
[im 23/67  brain]
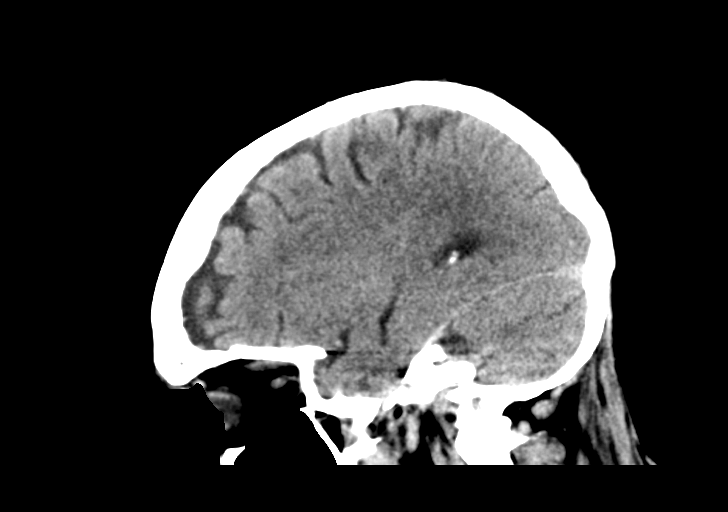
[im 34/67  brain]
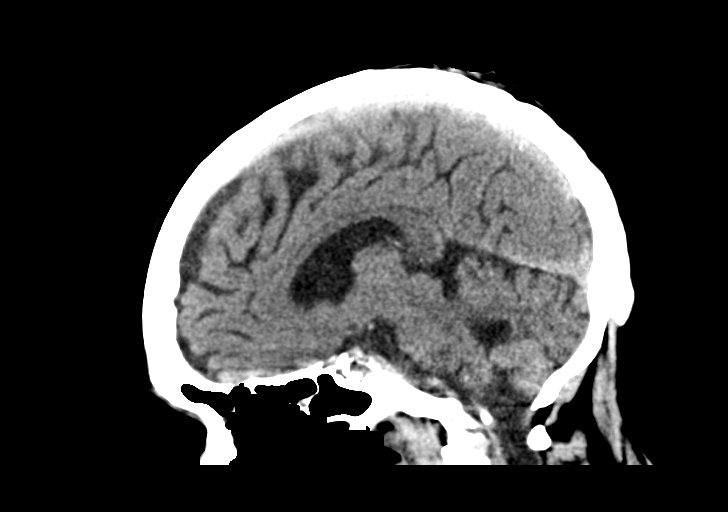
[im 45/67  brain]
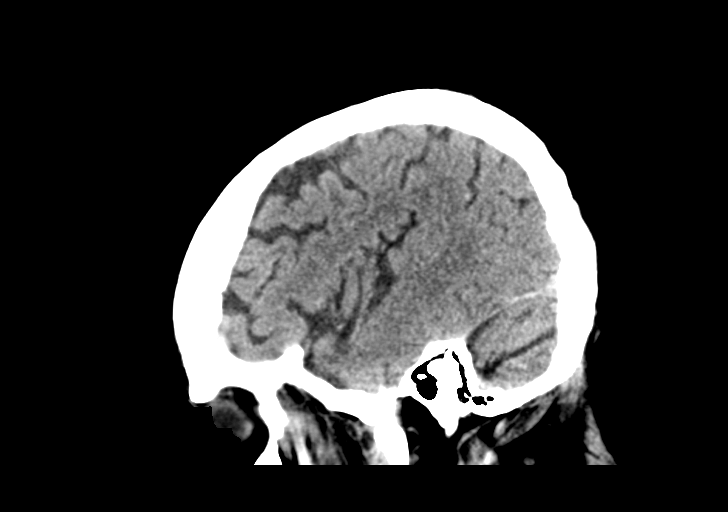

[16 of 47 positions shown; findings below may reference images not displayed]

FINDINGS: Brain: Generalized atrophy. Normal ventricular morphology. No
midline shift or mass effect. Small vessel chronic ischemic changes
of deep cerebral white matter. No intracranial hemorrhage, mass
lesion, evidence of acute infarction, or definite extra-axial fluid
collection. Few scattered motion artifacts. Tiny focus of high
attenuation is seen adjacent to the LEFT frontal lobe image 12, felt
to represent an artifact.

Vascular: Extensive atherosclerotic calcification of internal
carotid arteries at skull base

Skull: Intact

Sinuses/Orbits: Mild mucosal thickening in scattered ethmoid air
cells

Other: N/A
IMPRESSION: Atrophy with small vessel chronic ischemic changes of deep cerebral
white matter.

No acute intracranial abnormalities.

## 2022-05-07 ENCOUNTER — Emergency Department
Admission: EM | Admit: 2022-05-07 | Discharge: 2022-05-07 | Disposition: A | Discharge status: Home / Self Care | Payer: Medicaid Other | Attending: Emergency Medicine | Admitting: Emergency Medicine

## 2022-05-07 DIAGNOSIS — T24202A Burn of second degree of unspecified site of left lower limb, except ankle and foot, initial encounter: Secondary | ICD-10-CM | POA: Insufficient documentation

## 2022-05-07 DIAGNOSIS — X030XXA Exposure to flames in controlled fire, not in building or structure, initial encounter: Secondary | ICD-10-CM | POA: Diagnosis not present

## 2022-05-07 DIAGNOSIS — T24002A Burn of unspecified degree of unspecified site of left lower limb, except ankle and foot, initial encounter: Secondary | ICD-10-CM | POA: Diagnosis present

## 2022-05-07 DIAGNOSIS — T310 Burns involving less than 10% of body surface: Secondary | ICD-10-CM | POA: Insufficient documentation

## 2022-05-07 MED ORDER — OXYCODONE HCL 5 MG PO TABS
10.0000 mg | ORAL_TABLET | Freq: Once | ORAL | Status: AC
  Administered 2022-05-07: 10 mg via ORAL
  Filled 2022-05-07: qty 2

## 2022-05-07 MED ORDER — OXYCODONE HCL 5 MG PO TABS: 5.0000 mg | ORAL_TABLET | Freq: Three times a day (TID) | ORAL | 0 refills | Status: DC | PRN

## 2022-05-07 MED ORDER — IBUPROFEN 400 MG PO TABS
400.0000 mg | ORAL_TABLET | Freq: Once | ORAL | Status: AC
  Administered 2022-05-07: 400 mg via ORAL
  Filled 2022-05-07: qty 1

## 2022-05-07 MED ORDER — SILVER SULFADIAZINE 1 % EX CREA
TOPICAL_CREAM | Freq: Once | CUTANEOUS | Status: AC
  Filled 2022-05-07 (×2): qty 85
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
## 2022-11-30 ENCOUNTER — Emergency Department: Payer: Medicaid Other

## 2022-11-30 ENCOUNTER — Encounter: Payer: Self-pay | Admitting: Emergency Medicine

## 2022-11-30 ENCOUNTER — Other Ambulatory Visit: Payer: Self-pay

## 2022-11-30 ENCOUNTER — Emergency Department
Admission: EM | Admit: 2022-11-30 | Discharge: 2022-11-30 | Disposition: A | Discharge status: Home / Self Care | Payer: Medicaid Other | Attending: Emergency Medicine | Admitting: Emergency Medicine

## 2022-11-30 DIAGNOSIS — I10 Essential (primary) hypertension: Secondary | ICD-10-CM | POA: Diagnosis not present

## 2022-11-30 DIAGNOSIS — M25551 Pain in right hip: Secondary | ICD-10-CM | POA: Insufficient documentation

## 2022-11-30 DIAGNOSIS — J449 Chronic obstructive pulmonary disease, unspecified: Secondary | ICD-10-CM | POA: Insufficient documentation

## 2022-11-30 DIAGNOSIS — E119 Type 2 diabetes mellitus without complications: Secondary | ICD-10-CM | POA: Diagnosis not present

## 2022-11-30 MED ORDER — KETOROLAC TROMETHAMINE 15 MG/ML IJ SOLN
15.0000 mg | Freq: Once | INTRAMUSCULAR | Status: AC
  Administered 2022-11-30: 15 mg via INTRAMUSCULAR
  Filled 2022-11-30: qty 1

## 2022-11-30 MED ORDER — LIDOCAINE 5 % EX PTCH
1.0000 | MEDICATED_PATCH | CUTANEOUS | Status: DC
  Administered 2022-11-30: 1 via TRANSDERMAL
  Filled 2022-11-30: qty 1

## 2022-11-30 NOTE — Discharge Instructions (Addendum)
You were evaluated in the ED for right hip pain.  Your x-ray is normal.  You were treated with anti-inflammatory and a lidocaine patch with improved your symptoms.  Is encouraged to take ibuprofen and Tylenol for pain as needed.  Rest.  Apply ice to the affected area as needed.  Perform light hip exercises to prevent joint stiffness.  Follow-up with orthopedics if symptoms do not improve.

## 2022-11-30 NOTE — ED Provider Notes (Signed)
[facility] Emergency Department Provider Note     Event Date/Time   First MD Initiated Contact with Patient 11/30/22 1227     (approximate)   History   Leg Pain   HPI  Jimmy Bishop is a 62 y.o. male with a history of diabetes, hypertension, COPD and arthritis presents to the ED with complaint of right hip pain x 1 day.  Denies injury or trauma.  Patient reports pain radiates down leg.  Patient reports this is an ongoing issue, but has since increased over the past day.  Patient is ambulatory.  No other complaints at this time.      Physical Exam   Triage Vital Signs: ED Triage Vitals  Encounter Vitals Group     BP 11/30/22 1035 97/67     Systolic BP Percentile --      Diastolic BP Percentile --      Pulse Rate 11/30/22 1035 73     Resp 11/30/22 1035 18     Temp 11/30/22 1035 98 F (36.7 C)     Temp src --      SpO2 11/30/22 1035 100 %     Weight 11/30/22 1247 179 lb 14.3 oz (81.6 kg)     Height 11/30/22 1247 5\' 9"  (1.753 m)     Head Circumference --      Peak Flow --      Pain Score 11/30/22 1034 3     Pain Loc --      Pain Education --      Exclude from Growth Chart --     Most recent vital signs: Vitals:   11/30/22 1035  BP: 97/67  Pulse: 73  Resp: 18  Temp: 98 F (36.7 C)  SpO2: 100%    General: Alert and oriented. INAD.  Skin:  Warm, dry and intact. No rashes or lesions noted.     Head:  NCAT.  Eyes:  PERRLA. EOMI.  Neck:   No cervical spine tenderness to palpation. Full ROM without difficulty.  CV:  Good peripheral perfusion. RRR. No peripheral edema.  RESP:  Normal effort. LCTAB. No retractions.  ABD:  No distention. Soft, Non tender.  BACK:  Spinous process is midline without deformity or tenderness.  Right lumbar region tenderness to palpation. (+) right STLR test. MSK:   Full ROM in all joints. No swelling, deformity or tenderness.  NEURO: Cranial nerves intact. No focal deficits. Sensation and motor function  intact. 4/5 muscle strength of LE.    ED Results / Procedures / Treatments   Labs (all labs ordered are listed, but only abnormal results are displayed) Labs Reviewed - No data to display  RADIOLOGY  I personally viewed and evaluated these images as part of my medical decision making, as well as reviewing the written report by the radiologist.  ED Provider Interpretation: Hip x-ray revealed no dislocation or fracture.  Will confirm with radiology read.  DG HIP UNILAT WITH PELVIS 2-3 VIEWS RIGHT  Result Date: 11/30/2022 CLINICAL DATA:  Pt comes with c/o right back and hip pain. Pt states this all started yesterday. Pt denies any injury. EXAM: DG HIP (WITH OR WITHOUT PELVIS) 2-3V RIGHT COMPARISON:  None Available. FINDINGS: No acute fracture or dislocation. No aggressive osseous lesion. Normal alignment. Generalized osteopenia. Severe osteoarthritis of the right hip. Soft tissue are unremarkable. No radiopaque foreign body or soft tissue emphysema. IMPRESSION: 1. Severe osteoarthritis of the right hip. 2. No acute osseous injury of  the right hip. Given the patient's age and osteopenia, if there is persistent clinical concern for an occult hip fracture, a MRI of the hip is recommended for increased sensitivity. Electronically Signed   By: Elige Ko M.D.   On: 11/30/2022 12:30    PROCEDURES:  Critical Care performed: No  Procedures  MEDICATIONS ORDERED IN ED: Medications  lidocaine (LIDODERM) 5 % 1 patch (1 patch Transdermal Patch Applied 11/30/22 1353)  ketorolac (TORADOL) 15 MG/ML injection 15 mg (15 mg Intramuscular Given 11/30/22 1353)   IMPRESSION / MDM / ASSESSMENT AND PLAN / ED COURSE  I reviewed the triage vital signs and the nursing notes.                               62 y.o. male presents to the emergency department for evaluation and treatment of right hip pain without injury. See HPI for further details.   Differential diagnosis includes, but is not limited to hip  fracture, hip dislocation, osteoarthritis, sciatica, tendinitis  Patient's presentation is most consistent with acute complicated illness / injury requiring diagnostic workup.  Patient is alert and oriented.  He is speaking normally and is not in any acute distress.  He is hemodynamically stable.  Physical exam is as stated above.  Hip x-ray is ordered in triage.  X-ray is reassuring.  I have a low suspicion of a hip fracture given no recent injury or falls and location of pain.  Patient is ambulatory.  Given there is back pain associated with the hip pain, I highly suspect sciatica or tendinitis versus an occult hip fracture.  Patient is administered Toradol and lidocaine patch in the ED and reports improvement in symptoms.  This is reassuring.  Patient is in stable condition for discharge home.  He is encouraged to alternate between Tylenol and ibuprofen for pain and to follow-up with an orthopedic specialist in a week's time. Patient is given ED precautions to return to the ED for any worsening or new symptoms. Patient verbalizes understanding. All questions and concerns were addressed during ED visit.     FINAL CLINICAL IMPRESSION(S) / ED DIAGNOSES   Final diagnoses:  Acute hip pain, right   Rx / DC Orders   ED Discharge Orders     None      Note:  This document was prepared using Dragon voice recognition software and may include unintentional dictation errors.    Romeo Apple, Ertha Nabor A, PA-C 11/30/22 1931    Pilar Jarvis, MD 11/30/22 2041

## 2022-11-30 NOTE — ED Triage Notes (Addendum)
Pt comes with c/o right back and leg pain. Pt states this all started yesterday. Pt denies any injury. Pt states it is his back and hip that is hurting.   Pt denies any falls. Pt states he did have some liquor today

## 2022-12-24 ENCOUNTER — Ambulatory Visit: Payer: Self-pay | Admitting: *Deleted

## 2022-12-24 NOTE — Telephone Encounter (Signed)
     Chief Complaint: Severe right hip pain. Has been to [facility] 2 weeks ago "and they gave him a shot in his hip." Symptoms: Pain Frequency: Months ago Pertinent Negatives: Patient denies  Disposition: [] ED /[x] Urgent Care (no appt availability in office) / [] Appointment(In office/virtual)/ []  [place]/ [] Home Care/ [] Refused Recommended Disposition /[] [place] [facility]/ []  Follow-up with PCP Additional Notes: Mother agrees with UC.  Reason for Disposition  [1] SEVERE pain (e.g., excruciating, unable to do any normal activities) AND [2] not improved after 2 hours of pain medicine  Answer Assessment - Initial Assessment Questions 1. LOCATION and RADIATION: "Where is the pain located?"      Rip pain 2. QUALITY: "What does the pain feel like?"  (e.g., sharp, dull, aching, burning)     Ache 3. SEVERITY: "How bad is the pain?" "What does it keep you from doing?"   (Scale 1-10; or mild, moderate, severe)   -  MILD (1-3): doesn't interfere with normal activities    -  MODERATE (4-7): interferes with normal activities (e.g., work or school) or awakens from sleep, limping    -  SEVERE (8-10): excruciating pain, unable to do any normal activities, unable to walk     10 4. ONSET: "When did the pain start?" "Does it come and go, or is it there all the time?"     Months ago 5. WORK OR EXERCISE: "Has there been any recent work or exercise that involved this part of the body?"      No 6. CAUSE: "What do you think is causing the hip pain?"      Arthritis  7. AGGRAVATING FACTORS: "What makes the hip pain worse?" (e.g., walking, climbing stairs, running)     Walking 8. OTHER SYMPTOMS: "Do you have any other symptoms?" (e.g., back pain, pain shooting down leg,  fever, rash)     Pain  Protocols used: Hip Pain-A-AH

## 2022-12-24 NOTE — Telephone Encounter (Signed)
Summary: Right hip pain   Ms Clearance Coots is stating pt is having Right hip pain  has been going on for months, current provider closed up shop with no forwarding information,  sees Dr Payton Mccallum monday     Attempted to call patient mother(DPR)- no answer- left message to call nurse triage

## 2022-12-27 ENCOUNTER — Ambulatory Visit: Payer: Medicaid Other | Admitting: Family Medicine

## 2022-12-27 ENCOUNTER — Telehealth: Payer: Self-pay | Admitting: Family Medicine

## 2022-12-27 NOTE — Telephone Encounter (Signed)
called pt to reschedule due to provider out sick. vm box is full. can't leave message & mychart is pending.

## 2023-01-28 DIAGNOSIS — M1611 Unilateral primary osteoarthritis, right hip: Secondary | ICD-10-CM | POA: Diagnosis not present

## 2023-01-28 DIAGNOSIS — E119 Type 2 diabetes mellitus without complications: Secondary | ICD-10-CM | POA: Diagnosis not present

## 2023-02-23 ENCOUNTER — Observation Stay (HOSPITAL_BASED_OUTPATIENT_CLINIC_OR_DEPARTMENT_OTHER)
Admit: 2023-02-23 | Discharge: 2023-02-23 | Disposition: A | Discharge status: Home / Self Care | Payer: Medicaid Other | Attending: Family Medicine | Admitting: Family Medicine

## 2023-02-23 ENCOUNTER — Emergency Department: Payer: Medicaid Other

## 2023-02-23 ENCOUNTER — Other Ambulatory Visit: Payer: Self-pay

## 2023-02-23 ENCOUNTER — Observation Stay
Admission: EM | Admit: 2023-02-23 | Discharge: 2023-02-24 | Disposition: A | Discharge status: Home / Self Care | Payer: Medicaid Other | Attending: Student | Admitting: Student

## 2023-02-23 DIAGNOSIS — J441 Chronic obstructive pulmonary disease with (acute) exacerbation: Secondary | ICD-10-CM

## 2023-02-23 DIAGNOSIS — J449 Chronic obstructive pulmonary disease, unspecified: Secondary | ICD-10-CM | POA: Diagnosis present

## 2023-02-23 DIAGNOSIS — F17210 Nicotine dependence, cigarettes, uncomplicated: Secondary | ICD-10-CM | POA: Diagnosis not present

## 2023-02-23 DIAGNOSIS — F1090 Alcohol use, unspecified, uncomplicated: Secondary | ICD-10-CM | POA: Insufficient documentation

## 2023-02-23 DIAGNOSIS — E1169 Type 2 diabetes mellitus with other specified complication: Secondary | ICD-10-CM | POA: Diagnosis not present

## 2023-02-23 DIAGNOSIS — E119 Type 2 diabetes mellitus without complications: Secondary | ICD-10-CM

## 2023-02-23 DIAGNOSIS — R58 Hemorrhage, not elsewhere classified: Secondary | ICD-10-CM | POA: Diagnosis not present

## 2023-02-23 DIAGNOSIS — R42 Dizziness and giddiness: Secondary | ICD-10-CM | POA: Diagnosis not present

## 2023-02-23 DIAGNOSIS — R55 Syncope and collapse: Principal | ICD-10-CM | POA: Diagnosis present

## 2023-02-23 DIAGNOSIS — I10 Essential (primary) hypertension: Secondary | ICD-10-CM | POA: Diagnosis present

## 2023-02-23 DIAGNOSIS — E1159 Type 2 diabetes mellitus with other circulatory complications: Secondary | ICD-10-CM | POA: Diagnosis present

## 2023-02-23 DIAGNOSIS — I672 Cerebral atherosclerosis: Secondary | ICD-10-CM | POA: Diagnosis not present

## 2023-02-23 DIAGNOSIS — Z7984 Long term (current) use of oral hypoglycemic drugs: Secondary | ICD-10-CM | POA: Insufficient documentation

## 2023-02-23 DIAGNOSIS — R040 Epistaxis: Secondary | ICD-10-CM | POA: Diagnosis not present

## 2023-02-23 DIAGNOSIS — Z720 Tobacco use: Secondary | ICD-10-CM | POA: Diagnosis present

## 2023-02-23 DIAGNOSIS — Z743 Need for continuous supervision: Secondary | ICD-10-CM | POA: Diagnosis not present

## 2023-02-23 LAB — BASIC METABOLIC PANEL
Anion gap: 12 (ref 5–15)
BUN: 25 mg/dL — ABNORMAL HIGH (ref 8–23)
CO2: 23 mmol/L (ref 22–32)
Calcium: 8.9 mg/dL (ref 8.9–10.3)
Chloride: 103 mmol/L (ref 98–111)
Creatinine, Ser: 1.07 mg/dL (ref 0.61–1.24)
GFR, Estimated: >60 mL/min (ref >60)
Glucose, Bld: 122 mg/dL — ABNORMAL HIGH (ref 70–99)
Potassium: 4 mmol/L (ref 3.5–5.1)
Sodium: 138 mmol/L (ref 135–145)

## 2023-02-23 LAB — URINALYSIS, ROUTINE W REFLEX MICROSCOPIC
Bacteria, UA: NONE SEEN
Bilirubin Urine: NEGATIVE
Glucose, UA: NEGATIVE mg/dL
Ketones, ur: 5 mg/dL — AB
Leukocytes,Ua: NEGATIVE
Nitrite: NEGATIVE
Protein, ur: NEGATIVE mg/dL
Specific Gravity, Urine: 1.01 (ref 1.005–1.030)
Squamous Epithelial / HPF: 0 /[HPF] (ref 0–5)
WBC, UA: 0 WBC/hpf (ref 0–5)
pH: 7 (ref 5.0–8.0)

## 2023-02-23 LAB — CBC
HCT: 42.6 % (ref 39.0–52.0)
Hemoglobin: 14.2 g/dL (ref 13.0–17.0)
MCH: 30.6 pg (ref 26.0–34.0)
MCHC: 33.3 g/dL (ref 30.0–36.0)
MCV: 91.8 fL (ref 80.0–100.0)
Platelets: 363 10*3/uL (ref 150–400)
RBC: 4.64 MIL/uL (ref 4.22–5.81)
RDW: 14.2 % (ref 11.5–15.5)
WBC: 6.6 10*3/uL (ref 4.0–10.5)
nRBC: 0 % (ref 0.0–0.2)

## 2023-02-23 LAB — CBG MONITORING, ED
Glucose-Capillary: 121 mg/dL — ABNORMAL HIGH (ref 70–99)
Glucose-Capillary: 135 mg/dL — ABNORMAL HIGH (ref 70–99)
Glucose-Capillary: 224 mg/dL — ABNORMAL HIGH (ref 70–99)

## 2023-02-23 LAB — ECHOCARDIOGRAM COMPLETE
AR max vel: 3.21 cm2
AV Area VTI: 3.04 cm2
AV Area mean vel: 2.97 cm2
AV Mean grad: 4 mm[Hg]
AV Peak grad: 7.2 mm[Hg]
Ao pk vel: 1.34 m/s
Area-P 1/2: 4.33 cm2
Height: 69 in
MV VTI: 2.5 cm2
S' Lateral: 3.2 cm
Weight: 3360 [oz_av]

## 2023-02-23 LAB — HEMOGLOBIN A1C
Hgb A1c MFr Bld: 5.9 % — ABNORMAL HIGH (ref 4.8–5.6)
Mean Plasma Glucose: 122.63 mg/dL

## 2023-02-23 LAB — MAGNESIUM: Magnesium: 2 mg/dL (ref 1.7–2.4)

## 2023-02-23 LAB — PROTIME-INR
INR: 1 (ref 0.8–1.2)
Prothrombin Time: 13.2 s (ref 11.4–15.2)

## 2023-02-23 LAB — HIV ANTIBODY (ROUTINE TESTING W REFLEX): HIV Screen 4th Generation wRfx: NONREACTIVE

## 2023-02-23 MED ORDER — AZITHROMYCIN 250 MG PO TABS
500.0000 mg | ORAL_TABLET | Freq: Every day | ORAL | Status: DC
  Administered 2023-02-24: 500 mg via ORAL
  Filled 2023-02-23: qty 2

## 2023-02-23 MED ORDER — INSULIN ASPART 100 UNIT/ML IJ SOLN
0.0000 [IU] | Freq: Three times a day (TID) | INTRAMUSCULAR | Status: DC
  Administered 2023-02-23: 3 [IU] via SUBCUTANEOUS
  Administered 2023-02-24 (×2): 1 [IU] via SUBCUTANEOUS
  Filled 2023-02-23: qty 1

## 2023-02-23 MED ORDER — IPRATROPIUM-ALBUTEROL 0.5-2.5 (3) MG/3ML IN SOLN
3.0000 mL | Freq: Four times a day (QID) | RESPIRATORY_TRACT | Status: DC
  Administered 2023-02-23 – 2023-02-24 (×4): 3 mL via RESPIRATORY_TRACT
  Filled 2023-02-23 (×4): qty 3

## 2023-02-23 MED ORDER — ONDANSETRON HCL 4 MG PO TABS: 4.0000 mg | ORAL_TABLET | Freq: Four times a day (QID) | ORAL | Status: DC | PRN

## 2023-02-23 MED ORDER — ONDANSETRON HCL 4 MG/2ML IJ SOLN: 4.0000 mg | Freq: Four times a day (QID) | INTRAMUSCULAR | Status: DC | PRN

## 2023-02-23 MED ORDER — SODIUM CHLORIDE 0.9% FLUSH
3.0000 mL | Freq: Two times a day (BID) | INTRAVENOUS | Status: DC
  Administered 2023-02-24: 3 mL via INTRAVENOUS

## 2023-02-23 MED ORDER — OXYCODONE HCL 5 MG PO TABS
5.0000 mg | ORAL_TABLET | Freq: Three times a day (TID) | ORAL | Status: DC | PRN
  Administered 2023-02-23 – 2023-02-24 (×4): 5 mg via ORAL
  Filled 2023-02-23 (×4): qty 1

## 2023-02-23 MED ORDER — METHYLPREDNISOLONE SODIUM SUCC 125 MG IJ SOLR
125.0000 mg | INTRAMUSCULAR | Status: AC
  Administered 2023-02-23: 125 mg via INTRAVENOUS
  Filled 2023-02-23: qty 2

## 2023-02-23 MED ORDER — LACTATED RINGERS IV BOLUS
1000.0000 mL | Freq: Once | INTRAVENOUS | Status: AC
  Administered 2023-02-23: 1000 mL via INTRAVENOUS

## 2023-02-23 MED ORDER — ENOXAPARIN SODIUM 60 MG/0.6ML IJ SOSY
0.5000 mg/kg | PREFILLED_SYRINGE | INTRAMUSCULAR | Status: DC
  Administered 2023-02-23: 47.5 mg via SUBCUTANEOUS
  Filled 2023-02-23: qty 0.6

## 2023-02-23 MED ORDER — SODIUM CHLORIDE 0.9 % IV SOLN: INTRAVENOUS | Status: DC

## 2023-02-23 MED ORDER — ALBUTEROL SULFATE (2.5 MG/3ML) 0.083% IN NEBU: 2.5000 mg | INHALATION_SOLUTION | RESPIRATORY_TRACT | Status: DC | PRN

## 2023-02-23 MED ORDER — SODIUM CHLORIDE 0.9 % IV SOLN
500.0000 mg | INTRAVENOUS | Status: AC
  Administered 2023-02-23: 500 mg via INTRAVENOUS
  Filled 2023-02-23: qty 5

## 2023-02-23 MED ORDER — LORAZEPAM 2 MG/ML IJ SOLN
0.5000 mg | Freq: Once | INTRAMUSCULAR | Status: AC
  Administered 2023-02-23: 0.5 mg via INTRAVENOUS
  Filled 2023-02-23: qty 1

## 2023-02-23 MED ORDER — PREDNISONE 20 MG PO TABS
40.0000 mg | ORAL_TABLET | Freq: Every day | ORAL | Status: DC
  Administered 2023-02-24: 40 mg via ORAL
  Filled 2023-02-23: qty 2

## 2023-02-23 NOTE — Assessment & Plan Note (Signed)
1/2 pack/day smoker Discussed cessation at bedside

## 2023-02-23 NOTE — Assessment & Plan Note (Signed)
Mild exacerbation with cough, wheezing, sputum production Positive diffuse wheezing on exam IV Solu-Medrol DuoNebs IV azithromycin Supplemental oxygen as needed Monitor

## 2023-02-23 NOTE — Progress Notes (Signed)
*  PRELIMINARY RESULTS* Echocardiogram 2D Echocardiogram has been performed.  Carolyne Fiscal 02/23/2023, 9:40 AM

## 2023-02-23 NOTE — H&P (Addendum)
History and Physical    Patient: Jimmy Bishop BMW:[id number] DOB: 1960/12/07 DOA: 02/23/2023 DOS: the patient was seen and examined on 02/23/2023 PCP: Pcp, No  Patient coming from: Home  Chief Complaint:  Chief Complaint  Patient presents with   Loss of Consciousness   Epistaxis   HPI: Jimmy Bishop is a 62 y.o. male with medical history significant of COPD, type 2 diabetes, hypertension presenting with syncope.  Patient reports syncopal episode 3 to 4 days ago.  Patient reports attempting to start a wooden stove when he leaned over/went from sitting to standing and passed out.  Denies any weakness dizziness chest pain prior to event.  Had a remote similar episode 2 weeks prior.  No orthopnea or PND.  Baseline COPD.  Noted cough, wheezing, increased production over multiple days.  +1/2 pack/day smoker.  Still using inhaler with minimal improvement in symptoms.  Positive head trauma to the right forehead/eye area.  No focal hemiparesis or confusion.  No slurred speech.  Presented to the ER afebrile, hemodynamically stable.  Satting well on room air.  Labs including CBC and BMP grossly within normal limits.  CT head stable.  Chest x-ray with coarse interstitial chronic findings. Review of Systems: As mentioned in the history of present illness. All other systems reviewed and are negative. Past Medical History:  Diagnosis Date   Arthritis    left knee   COPD (chronic obstructive pulmonary disease) (HCC)    Diabetes mellitus without complication (HCC)    Hypertension    Past Surgical History:  Procedure Laterality Date   ANKLE SURGERY     COLONOSCOPY WITH PROPOFOL N/A 07/20/2017   Procedure: COLONOSCOPY WITH PROPOFOL;  Surgeon: Toledo, Boykin Nearing, MD;  Location: [facility];  Service: Gastroenterology;  Laterality: N/A;   IRRIGATION AND DEBRIDEMENT ABSCESS Right 09/09/2020   Procedure: IRRIGATION AND DEBRIDEMENT ABSCESS-RIGHT FOREARM;  Surgeon: Carolan Shiver, MD;  Location:  [facility] ORS;  Service: General;  Laterality: Right;   POSTERIOR CERVICAL FUSION/FORAMINOTOMY N/A 04/18/2015   Procedure: Posterior cervical fusion with lateral mass fixation - Cervical three-Thoracic one, cervical laminectomy Cervical three-Cervical seven;  Surgeon: Tia Alert, MD;  Location: [facility];  Service: Neurosurgery;  Laterality: N/A;   Social History:  reports that he has been smoking cigarettes. He has a 40 pack-year smoking history. He has never used smokeless tobacco. He reports current alcohol use. He reports that he does not use drugs.  Allergies  Allergen Reactions   Vicodin [Hydrocodone-Acetaminophen] Hives and Rash    Family History  Problem Relation Age of Onset   Lung cancer Mother    CAD Mother    Lung cancer Sister     Prior to Admission medications   Medication Sig Start Date End Date Taking? Authorizing Provider  albuterol (PROVENTIL HFA;VENTOLIN HFA) 108 (90 Base) MCG/ACT inhaler Inhale 2 puffs into the lungs every 6 (six) hours as needed for wheezing or shortness of breath. 05/27/17  Yes Bridget Hartshorn L, PA-C  amLODipine (NORVASC) 10 MG tablet Take 1 tablet by mouth daily. 08/27/20  Yes [provider]  cetirizine (ZYRTEC) 10 MG tablet Take 1 tablet (10 mg total) by mouth daily. 09/14/20 02/23/23 Yes Briant Cedar, MD  FLOVENT HFA 220 MCG/ACT inhaler Inhale 2 puffs into the lungs 2 (two) times daily. 04/16/20  Yes [provider]  gabapentin (NEURONTIN) 600 MG tablet Take 600 mg by mouth 3 (three) times daily. 01/18/23  Yes [provider]  lisinopril (ZESTRIL) 20 MG tablet  Take 1 tablet by mouth daily. 08/11/20  Yes [provider]  metFORMIN (GLUCOPHAGE) 500 MG tablet Take 500 mg by mouth 2 (two) times daily with a meal.   Yes [provider]  oxyCODONE (ROXICODONE) 5 MG immediate release tablet Take 1 tablet (5 mg total) by mouth every 8 (eight) hours as needed. 05/07/22 05/07/23 Yes Jene Every, MD  STIOLTO  RESPIMAT 2.5-2.5 MCG/ACT AERS SMARTSIG:2 Puff(s) Via Inhaler Every Morning 08/22/19  Yes [provider]  Cholecalciferol (VITAMIN D3) 1.25 MG (50000 UT) CAPS Take 1 capsule by mouth once a week. Patient not taking: Reported on 02/23/2023 08/09/20   [provider]  ferrous sulfate 325 (65 FE) MG tablet Take 1 tablet by mouth daily. Patient not taking: Reported on 02/23/2023    [provider]    Physical Exam: Vitals:   02/23/23 0519 02/23/23 0526 02/23/23 0527 02/23/23 0730  BP:  136/71  (!) 139/93  Pulse: 86   73  Resp: 18   (!) 25  Temp: 97.7 F (36.5 C)     TempSrc: Oral     SpO2: 95%   98%  Weight:   95.3 kg   Height:   5\' 9"  (1.753 m)    Physical Exam Constitutional:      Appearance: He is obese.  HENT:     Head: Normocephalic and atraumatic.     Comments: + mild R eye bruising-improving     Nose: Nose normal.     Mouth/Throat:     Mouth: Mucous membranes are moist.  Eyes:     Pupils: Pupils are equal, round, and reactive to light.  Cardiovascular:     Rate and Rhythm: Normal rate and regular rhythm.  Pulmonary:     Effort: Pulmonary effort is normal.     Breath sounds: Wheezing present.  Abdominal:     General: Bowel sounds are normal.  Musculoskeletal:        General: Normal range of motion.     Cervical back: Normal range of motion.  Skin:    General: Skin is warm.  Neurological:     General: No focal deficit present.  Psychiatric:        Mood and Affect: Mood normal.     Data Reviewed:  There are no new results to review at this time.  Assessment and Plan: * Syncope Positive syncopal event associated with starting wood stove Suspect vasovagal event as patient reports syncope going from sitting to standing/leaning over Also with mild COPD exacerbation Would gently hydrate patient Check orthostatics 2D echo COPD exacerbation treatment Monitor  COPD (chronic obstructive pulmonary disease) (HCC) Mild exacerbation with  cough, wheezing, sputum production Positive diffuse wheezing on exam IV Solu-Medrol DuoNebs IV azithromycin Supplemental oxygen as needed Monitor  Diabetes mellitus without complication (HCC) Blood sugar in 120s Monitor with steroid use SSI    Tobacco abuse 1/2 pack/day smoker Discussed cessation at bedside  Hypertension Titrate regimen pending orthostatics    Greater than 50% was spent in counseling and coordination of care with patient Total encounter time 80 minutes or more   Advance Care Planning:   Code Status: Full Code   Consults: None   Family Communication: No family at the bedside   Severity of Illness: The appropriate patient status for this patient is OBSERVATION. Observation status is judged to be reasonable and necessary in order to provide the required intensity of service to ensure the patient's safety. The patient's presenting symptoms, physical exam findings, and initial  radiographic and laboratory data in the context of their medical condition is felt to place them at decreased risk for further clinical deterioration. Furthermore, it is anticipated that the patient will be medically stable for discharge from the hospital within 2 midnights of admission.   Author: Floydene Flock, MD 02/23/2023 7:59 AM  For on call review [email or web address].

## 2023-02-23 NOTE — TOC Initial Note (Addendum)
Transition of Care [facility]) - Initial/Assessment Note    Patient Details  Name: Jimmy Bishop MRN: [id number] Date of Birth: [birth year]  Transition [facility] Care [facility]) CM/SW Contact:    Marquita Palms, LCSW Phone Number: 02/23/2023, 12:10 PM  Clinical Narrative:                  CSW met with patient bedside. Patient reports he lives with his mother and brother. [facility] [place]. Patient reports he only has a cane that he uses for stability, and is having hip surgery in Jan; which he has with him in the room. Patient reports he has transportation to his home upon discharge. Patient agreeable to [facility] if needed. Patient reports MD told him he was admitting. No other needs at this time.       Patient Goals and CMS Choice    Patient wants to go home possibly with [facility]        Expected Discharge Plan and Services    Home                                          Prior Living Arrangements/Services    Patient live with is mother and brother                   Activities of Daily Living      Permission Sought/Granted                  Emotional Assessment              Admission diagnosis:  Syncope [R55] Patient Active Problem List   Diagnosis Date Noted   Syncope 02/23/2023   Right forearm cellulitis 09/09/2020   Dog bite 09/09/2020   COPD (chronic obstructive pulmonary disease) (HCC) 09/09/2020   Abscess of right forearm 09/09/2020   Hypertension    Severe sepsis (HCC)    HLD (hyperlipidemia)    Tobacco abuse    Diabetes mellitus without complication (HCC)    S/P cervical spinal fusion 04/18/2015   PCP:  Pcp, No Pharmacy:   [id number] [place], [place] - [address] [facility] [address] [place] [address] Phone: [phone] Fax: [phone]     Social Determinants of Health (SDOH) Social History: SDOH Screenings   Food Insecurity: No Food Insecurity  (05/24/2022)   Received from [facility], [facility]  Housing: Low Risk  (09/30/2020)  Transportation Needs: No Transportation Needs (05/24/2022)   Received from [facility], [facility]  Financial Resource Strain: Low Risk  (05/24/2022)   Received from [facility], [facility]  Tobacco Use: High Risk (02/23/2023)   SDOH Interventions:     Readmission Risk Interventions     No data to display

## 2023-02-23 NOTE — ED Notes (Signed)
Echo at bed side with patient.

## 2023-02-23 NOTE — ED Notes (Signed)
PT at bedside. Pt given warm blanket.

## 2023-02-23 NOTE — Progress Notes (Signed)
PT Cancellation Note  Patient Details Name: Jimmy Bishop MRN: [id number] DOB: 17-Oct-1960   Cancelled Treatment:    Reason Eval/Treat Not Completed: Medical issues which prohibited therapy Pt was working with OT and had consistently elevated BPs (>200s/>100s).  Nursing was notified; will hold PT until vitals are more appropriate for prolonged activity.  Malachi Pro, DPT 02/23/2023, 12:25 PM

## 2023-02-23 NOTE — ED Notes (Signed)
Pt informed of need for urine sample. Pt encouraged to use urinal and call for help if needed

## 2023-02-23 NOTE — Assessment & Plan Note (Signed)
Positive syncopal event associated with starting wood stove Suspect vasovagal event as patient reports syncope going from sitting to standing/leaning over Also with mild COPD exacerbation Would gently hydrate patient Check orthostatics 2D echo COPD exacerbation treatment Monitor

## 2023-02-23 NOTE — Assessment & Plan Note (Signed)
Blood sugar in 120s Monitor with steroid use SSI

## 2023-02-23 NOTE — ED Notes (Signed)
Pt complaining of being lightheaded. Katrinka Blazing MD aware

## 2023-02-23 NOTE — Evaluation (Signed)
Physical Therapy Evaluation Patient Details Name: Jimmy Bishop MRN: [id number] DOB: 1960-03-29 Today's Date: 02/23/2023  History of Present Illness  62 y.o. male presenting with syncope. He reports syncopal episode 3-4 days prior to arrival when attempting to start a wooden stove; he leaned over/went from sitting to standing and passed out. Denies weakness, dizziness, chest pain prior to event. Had a remote similar episode 2 weeks prior. PMHx: COPD, type 2 diabetes, hypertension  Clinical Impression  Pt pleasant and willing to work with PT. He did not have any issues with lightheadedness or BP changes t/o session.  He was able to ambulate >100 ft using RW (he has been limping more and more with the cane) which he reports was much more supportive and helpful than he expected and did allow him to ambulate better today than he typically does.  He still displayed slow and labored effort, with fatigue (HR 110s, O2 mid/low 90s, some DOE) but did not have any LOBs or safety issues.  Pt will benefit from continued PT to address functional limitations and insure safe discharge.        If plan is discharge home, recommend the following: A little help with walking and/or transfers;Help with stairs or ramp for entrance   Can travel by private vehicle        Equipment Recommendations Rolling walker (2 wheels)  Recommendations for Other Services       Functional Status Assessment Patient has had a recent decline in their functional status and demonstrates the ability to make significant improvements in function in a reasonable and predictable amount of time.     Precautions / Restrictions Precautions Precautions: Fall Restrictions Weight Bearing Restrictions: No      Mobility  Bed Mobility Overal bed mobility: Modified Independent             General bed mobility comments: able to get himself to sitting EOB w/o assist, minimal extra time    Transfers Overall transfer level:  Modified independent Equipment used: Rolling walker (2 wheels)               General transfer comment: Cuing for UE placement, able to rise from bed and recliner w/o assist    Ambulation/Gait Ambulation/Gait assistance: Contact guard assist Gait Distance (Feet): 110 Feet Assistive device: Rolling walker (2 wheels)         General Gait Details: Pt reports he has been consistently using SPC, pt suggested FWW and he did much better with it than cane.  He still has R limp, but much less pronounced and generally much safer with the RW.  He showed good motivation and though he had some fatigue with the effort was motivated to push more.  HR did stay in the 110s nearly the entire time and O2 in the mid/low 90s with some reported fatigue, baseline pain that increases with the effort.  Stairs            Wheelchair Mobility     Tilt Bed    Modified Rankin (Stroke Patients Only)       Balance Overall balance assessment: Modified Independent (highly reliant on walker, but no LOBS)                                           Pertinent Vitals/Pain Pain Assessment Pain Assessment: 0-10 Pain Score: 5  Pain Location: R hip Pain  Descriptors / Indicators: Constant, Aching    Home Living Family/patient expects to be discharged to:: Private residence Living Arrangements: Other relatives Available Help at Discharge: Family;Available 24 hours/day Type of Home: House Home Access: Stairs to enter Entrance Stairs-Rails: Can reach both;Left;Right Entrance Stairs-Number of Steps: 4-5 back of the house; 4 steps on front   Home Layout: One level Home Equipment: Grab bars - toilet;Cane - single point      Prior Function Prior Level of Function : Independent/Modified Independent             Mobility Comments: SPC for all mobility; very minimal driving, scheduled for R hip surgery in January ADLs Comments: MOD I/IND     Extremity/Trunk Assessment   Upper  Extremity Assessment Upper Extremity Assessment: Generalized weakness;Overall [facility] for tasks assessed    Lower Extremity Assessment Lower Extremity Assessment: Generalized weakness;Overall [facility] for tasks assessed (R hip pain limited, lacks full extension)       Communication   Communication Communication: No apparent difficulties  Cognition Arousal: Alert Behavior During Therapy: WFL for tasks assessed/performed Overall Cognitive Status: Within Functional Limits for tasks assessed                                          General Comments General comments (skin integrity, edema, etc.): BP in supine 153/81, sitting 155/88 - asymptomatic t/o session re: BP.  Pt to have R total hip repalacement in early January, this is limiting his mobility as much as anything.    Exercises     Assessment/Plan    PT Assessment Patient needs continued PT services  PT Problem List Decreased strength;Decreased range of motion;Decreased activity tolerance;Decreased balance;Decreased mobility;Decreased safety awareness;Pain;Cardiopulmonary status limiting activity       PT Treatment Interventions DME instruction;Gait training;Therapeutic activities;Therapeutic exercise;Balance training;Stair training;Functional mobility training;Patient/family education    PT Goals (Current goals can be found in the Care Plan section)  Acute Rehab PT Goals Patient Stated Goal: figure out why he passed out PT Goal Formulation: With patient Time For Goal Achievement: 03/08/23 Potential to Achieve Goals: Good    Frequency Min 1X/week     Co-evaluation               AM-PAC PT "6 Clicks" Mobility  Outcome Measure Help needed turning from your back to your side while in a flat bed without using bedrails?: None Help needed moving from lying on your back to sitting on the side of a flat bed without using bedrails?: None Help needed moving to and from a bed to a chair (including a wheelchair)?:  None Help needed standing up from a chair using your arms (e.g., wheelchair or bedside chair)?: None Help needed to walk in hospital room?: A Little Help needed climbing 3-5 steps with a railing? : A Little 6 Click Score: 22    End of Session Equipment Utilized During Treatment: Gait belt Activity Tolerance: Patient limited by pain;Patient limited by fatigue;Patient tolerated treatment well Patient left: with bed alarm set;with call bell/phone within reach Nurse Communication: Mobility status PT Visit Diagnosis: Muscle weakness (generalized) (M62.81);Difficulty in walking, not elsewhere classified (R26.2);Pain Pain - Right/Left: Right Pain - part of body: Hip    Time: 1610-9604 PT Time Calculation (min) (ACUTE ONLY): 18 min   Charges:   PT Evaluation $PT Eval Low Complexity: 1 Low PT Treatments $Gait Training: 8-22 mins PT General Charges $$  ACUTE PT VISIT: 1 Visit         Malachi Pro, DPT 02/23/2023, 3:59 PM

## 2023-02-23 NOTE — ED Notes (Signed)
Pt sleeping. 

## 2023-02-23 NOTE — Evaluation (Signed)
Occupational Therapy Evaluation Patient Details Name: Jimmy Bishop MRN: [id number] DOB: 1960-04-14 Today's Date: 02/23/2023   History of Present Illness 62 y.o. male presenting with syncope. He reports syncopal episode 3-4 days ago when attempting to start a wooden stove he leaned over/went from sitting to standing and passed out. Denies weakness, dizziness, chest pain prior to event. Had a remote similar episode 2 weeks prior. PMHx: COPD, type 2 diabetes, hypertension   Clinical Impression   Pt was seen for OT evaluation this date. Prior to hospital admission, pt lives in a one level home with his mother and brother. Reports MOD I with all tasks at baseline using SPC for mobility. Minimal driving.  Pt presents to acute OT demonstrating impaired ADL performance and functional mobility 2/2 recent syncopal episodes with no warning (See OT problem list for additional functional deficits). Very limited eval d/t high BP readings with change in positions. Pt currently requires MOD I with bed mobility. Supine BP-156/83 and in sitting EOB 217/177. Rechecked in sitting to see if accurate and 266/205. UB dressing to don gown with set up. Returned to supine and BP 159/80. Notified nurse of BP readings. Will cont to follow to achieve better assessment tomorrow, may not need OT services once BP stabilizes. Pt would benefit from skilled OT services to address noted impairments and functional limitations (see below for any additional details) in order to maximize safety and independence while minimizing falls risk and caregiver burden. Unsure of need for follow up OT services upon acute hospital DC.        If plan is discharge home, recommend the following: A little help with walking and/or transfers;A little help with bathing/dressing/bathroom;Assist for transportation;Assistance with cooking/housework    Functional Status Assessment  Patient has had a recent decline in their functional status and  demonstrates the ability to make significant improvements in function in a reasonable and predictable amount of time.  Equipment Recommendations  Other (comment) (unsure at this time d/t limited eval)    Recommendations for Other Services       Precautions / Restrictions Precautions Precautions: Fall Restrictions Weight Bearing Restrictions: No      Mobility Bed Mobility Overal bed mobility: Modified Independent                  Transfers                   General transfer comment: NT d/t high BP      Balance Overall balance assessment: No apparent balance deficits (not formally assessed)                                         ADL either performed or assessed with clinical judgement   ADL Overall ADL's : Needs assistance/impaired                 Upper Body Dressing : Independent                           Vision         Perception         Praxis         Pertinent Vitals/Pain Pain Assessment Pain Assessment: 0-10 Pain Score: 7  Pain Location: R hip Pain Descriptors / Indicators: Constant, Aching Pain Intervention(s): Monitored during session, Limited activity within patient's tolerance  Extremity/Trunk Assessment Upper Extremity Assessment Upper Extremity Assessment: Overall WFL for tasks assessed   Lower Extremity Assessment Lower Extremity Assessment: Defer to PT evaluation       Communication Communication Communication: No apparent difficulties   Cognition Arousal: Alert Behavior During Therapy: WFL for tasks assessed/performed Overall Cognitive Status: Within Functional Limits for tasks assessed                                 General Comments: ox4     General Comments  supine BP 156/83, sit EOB 217/177, recheck 266/205, returned to supine 159/80    Exercises Other Exercises Other Exercises: Edu on role of OT in acute setting, edu on inability to perform increased  activity d/t high BP readings.   Shoulder Instructions      Home Living Family/patient expects to be discharged to:: Private residence Living Arrangements: Other relatives (mother and brother) Available Help at Discharge: Family;Available 24 hours/day Type of Home: House Home Access: Stairs to enter [employer] of Steps: 4-5 back of the house; 4 steps on front Entrance Stairs-Rails: Can reach both;Left;Right Home Layout: One level     Bathroom Shower/Tub: Chief Strategy Officer: Standard     Home Equipment: Grab bars - toilet;Cane - single point          Prior Functioning/Environment Prior Level of Function : Independent/Modified Independent             Mobility Comments: SPC for all mobility; very minimal driving, scheduled for R hip surgery in January ADLs Comments: MOD I/IND        OT Problem List: Decreased strength      OT Treatment/Interventions: Self-care/ADL training;Therapeutic exercise;Therapeutic activities;DME and/or AE instruction;Patient/family education;Balance training    OT Goals(Current goals can be found in the care plan section) Acute Rehab OT Goals Patient Stated Goal: return home OT Goal Formulation: With patient Time For Goal Achievement: 03/09/23 Potential to Achieve Goals: Good ADL Goals Pt Will Perform Lower Body Bathing: with set-up;sitting/lateral leans;sit to/from stand Pt Will Perform Lower Body Dressing: with set-up;sitting/lateral leans;sit to/from stand Pt Will Transfer to Toilet: with modified independence;regular height toilet;ambulating Pt Will Perform Toileting - Clothing Manipulation and hygiene: with modified independence;sit to/from stand;sitting/lateral leans  OT Frequency: Min 1X/week    Co-evaluation              AM-PAC OT "6 Clicks" Daily Activity     Outcome Measure Help from another person eating meals?: None Help from another person taking care of personal grooming?: None Help from  another person toileting, which includes using toliet, bedpan, or urinal?: A Little Help from another person bathing (including washing, rinsing, drying)?: A Little Help from another person to put on and taking off regular upper body clothing?: None Help from another person to put on and taking off regular lower body clothing?: A Little 6 Click Score: 21   End of Session Nurse Communication:  (increase in BP with change in positions)  Activity Tolerance: Treatment limited secondary to medical complications (Comment) (high BP) Patient left: in bed;with call bell/phone within reach  OT Visit Diagnosis: History of falling (Z91.81)                Time: 5784-6962 OT Time Calculation (min): 22 min Charges:  OT General Charges $OT Visit: 1 Visit OT Evaluation $OT Eval Low Complexity: 1 Low Esta Carmon, OTR/L 02/23/23, 12:06 PM  Shellie Goettl E Arieon Scalzo  02/23/2023, 12:02 PM

## 2023-02-23 NOTE — ED Notes (Signed)
Dinner at bedside

## 2023-02-23 NOTE — Assessment & Plan Note (Addendum)
Titrate regimen pending orthostatics

## 2023-02-23 NOTE — ED Notes (Signed)
Pt assisted to use urinal, standy by assist with walker.
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
## 2023-02-23 NOTE — ED Triage Notes (Signed)
Pt to ED via [facility] c/o syncope and epistaxis. Pt reports passing out once on Saturday and one a couple weeks ago. Pt reports he wasn't feeling sick, dizzy, partaking in strenuous activity or having any pain when he passed out both times. Pt also complaining of nosebleeds for past 3 days. Pt does not take any blood thinners, no bleeding at this time. Pt A&Ox4, answering all questions appropriately

## 2023-02-23 NOTE — ED Notes (Signed)
Alvester Morin, MD at bedside. NAD noted.

## 2023-02-24 DIAGNOSIS — T8189XA Other complications of procedures, not elsewhere classified, initial encounter: Secondary | ICD-10-CM | POA: Diagnosis not present

## 2023-02-24 DIAGNOSIS — R55 Syncope and collapse: Secondary | ICD-10-CM | POA: Diagnosis not present

## 2023-02-24 DIAGNOSIS — L03113 Cellulitis of right upper limb: Secondary | ICD-10-CM | POA: Diagnosis not present

## 2023-02-24 DIAGNOSIS — A419 Sepsis, unspecified organism: Secondary | ICD-10-CM | POA: Diagnosis not present

## 2023-02-24 LAB — CBC
HCT: 42.3 % (ref 39.0–52.0)
Hemoglobin: 13.9 g/dL (ref 13.0–17.0)
MCH: 30.3 pg (ref 26.0–34.0)
MCHC: 32.9 g/dL (ref 30.0–36.0)
MCV: 92.2 fL (ref 80.0–100.0)
Platelets: 346 10*3/uL (ref 150–400)
RBC: 4.59 MIL/uL (ref 4.22–5.81)
RDW: 14.1 % (ref 11.5–15.5)
WBC: 6.3 10*3/uL (ref 4.0–10.5)
nRBC: 0 % (ref 0.0–0.2)

## 2023-02-24 LAB — COMPREHENSIVE METABOLIC PANEL
ALT: 19 U/L (ref 0–44)
AST: 19 U/L (ref 15–41)
Albumin: 3.6 g/dL (ref 3.5–5.0)
Alkaline Phosphatase: 50 U/L (ref 38–126)
Anion gap: 10 (ref 5–15)
BUN: 18 mg/dL (ref 8–23)
CO2: 24 mmol/L (ref 22–32)
Calcium: 9.2 mg/dL (ref 8.9–10.3)
Chloride: 102 mmol/L (ref 98–111)
Creatinine, Ser: 0.83 mg/dL (ref 0.61–1.24)
GFR, Estimated: >60 mL/min (ref >60)
Glucose, Bld: 144 mg/dL — ABNORMAL HIGH (ref 70–99)
Potassium: 4.4 mmol/L (ref 3.5–5.1)
Sodium: 136 mmol/L (ref 135–145)
Total Bilirubin: 0.6 mg/dL (ref <1.2)
Total Protein: 6.8 g/dL (ref 6.5–8.1)

## 2023-02-24 LAB — GLUCOSE, CAPILLARY
Glucose-Capillary: 139 mg/dL — ABNORMAL HIGH (ref 70–99)
Glucose-Capillary: 147 mg/dL — ABNORMAL HIGH (ref 70–99)

## 2023-02-24 MED ORDER — AZITHROMYCIN 500 MG PO TABS: 500.0000 mg | ORAL_TABLET | Freq: Every day | ORAL | 0 refills | Status: AC

## 2023-02-24 MED ORDER — INFLUENZA VIRUS VACC SPLIT PF (FLUZONE) 0.5 ML IM SUSY: 0.5000 mL | PREFILLED_SYRINGE | INTRAMUSCULAR | Status: DC

## 2023-02-24 MED ORDER — GUAIFENESIN ER 600 MG PO TB12: 600.0000 mg | ORAL_TABLET | Freq: Two times a day (BID) | ORAL | Status: DC

## 2023-02-24 MED ORDER — PREDNISONE 20 MG PO TABS: 40.0000 mg | ORAL_TABLET | Freq: Every day | ORAL | 0 refills | Status: AC

## 2023-02-24 MED ORDER — GUAIFENESIN ER 600 MG PO TB12: 600.0000 mg | ORAL_TABLET | Freq: Two times a day (BID) | ORAL | 0 refills | Status: AC

## 2023-02-24 NOTE — Progress Notes (Signed)
Patient is adequate for discharge. IV removed without complications. Patient is dressed. DME delivered to patient's room. Discharge education completed. Waiting for ride.  Jimmy Bishop

## 2023-02-24 NOTE — Plan of Care (Signed)
 Discharging

## 2023-02-24 NOTE — Discharge Summary (Signed)
[facility] Discharge Summary   Patient: Jimmy Bishop:[id number]  PCP: Pcp, No  Date of admission: 02/23/2023   Date of discharge:  02/24/2023     Discharge Diagnoses:  Principal Problem:   Syncope Active Problems:   Hypertension   Tobacco abuse   Diabetes mellitus without complication (HCC)   COPD (chronic obstructive pulmonary disease) (HCC)   Admitted From: Home Disposition:  Home with home health services  Recommendations for Outpatient Follow-up:  Follow-up with PCP in 1 week.  Follow-up for lung cancer screening as an outpatient Follow-up with pulmonary in 1 to 2 weeks for PFTs and lung cancer screening Follow up LABS/TEST:  as above   Follow-up Information     PCP Follow up in 1 week(s).          Erin Fulling, MD Follow up in 1 week(s).   Specialties: Pulmonary Disease, Cardiology Contact information: [address] [place] [address] [phone]                Diet recommendation: Cardiac and Carb modified diet  Activity: The patient is advised to gradually reintroduce usual activities, as tolerated  Discharge Condition: stable  Code Status: Full code   History of present illness: As per the H and P dictated on admission Hospital Course:  Jimmy Bishop is a 62 y.o. male with medical history significant of COPD, type 2 diabetes, hypertension presenting with syncope.  Patient reports syncopal episode 3 to 4 days ago.  Patient reports attempting to start a wooden stove when he leaned over/went from sitting to standing and passed out.  Denies any weakness dizziness chest pain prior to event.  Had a remote similar episode 2 weeks prior.  No orthopnea or PND.  Baseline COPD.  Noted cough, wheezing, increased production over multiple days.  +1/2 pack/day smoker.  Still using inhaler with minimal improvement in symptoms.  Positive head trauma to the right forehead/eye area.  No focal hemiparesis or confusion.  No slurred  speech.  Presented to the ER afebrile, hemodynamically stable.  Satting well on room air.  Labs including CBC and BMP grossly within normal limits.  CT head stable.  Chest x-ray with coarse interstitial chronic findings.   Assessment and Plan:   # Syncope:  Positive syncopal event associated with starting wood stove Suspect vasovagal event as patient reports syncope going from sitting to standing/leaning over, Also with mild COPD exacerbation. S/p gentle hydration.  TTE LVEF 60 to 65%, mild LV hypertrophy, no wall motion abnormality.  No significant valvular abnormality.  Patient ambulated well in the hallway without any headache, dizziness or any syncopal episodes. # COPD (chronic obstructive pulmonary disease) Mild exacerbation with cough, wheezing, sputum production. Positive diffuse wheezing on exam S/p IV Solu-Medrol, DuoNebs and IV azithromycin. Supplemental oxygen inhalation weaned off and patient is saturating well on room air.  While ambulation oxygen dropped to 88% and returned to 94% quickly upon rest.  Patient was discharged on prednisone 40 mg p.o. daily for 3 days, azithromycin 5 mg p.o. daily for 3 days, Mucinex 600 mg p.o. twice daily for 5 days.  Resumed home inhalers.  Recommended to follow with PCP and pulmonologist for PFTs as an outpatient. # Diabetes mellitus without complication: Continued home meds, remains at high risk for hyperglycemia due to steroids.  Patient was advised to monitor CBG at home, continue diabetic diet and follow with PCP # Tobacco abuse, 1/2 pack/day smoker, smoking cessation counseling done. # Hypertension, and  HLD, stable, resumed home meds. Titrate regimen pending orthostatics  Body mass index is 31.01 kg/m.  Nutrition Interventions:  - Patient was instructed, not to drive, operate heavy machinery, perform activities at heights, swimming or participation in water activities or provide baby sitting services while on Pain, Sleep and Anxiety  Medications; until his outpatient Physician has advised to do so again.  - Also recommended to not to take more than prescribed Pain, Sleep and Anxiety Medications.  Patient was seen by physical therapy, who recommended Home health, which was arranged. On the day of the discharge the patient's vitals were stable, and no other acute medical condition were reported by patient. the patient was felt safe to be discharge at Home with Home health.  Consultants: None Procedures: None  Discharge Exam: General: Appear in no distress, no Rash; Oral Mucosa Clear, moist. Cardiovascular: S1 and S2 Present, no Murmur, Respiratory: normal respiratory effort, Bilateral Air entry present and no Crackles, no wheezes Abdomen: Bowel Sound present, Soft and no tenderness, no hernia Extremities: no Pedal edema, no calf tenderness Neurology: alert and oriented to time, place, and person affect appropriate.  Filed Weights   02/23/23 0527  Weight: 95.3 kg   Vitals:   02/24/23 0735 02/24/23 0759  BP:  139/72  Pulse: 78 80  Resp: 16 17  Temp:  97.7 F (36.5 C)  SpO2: 94% 98%    DISCHARGE MEDICATION: Allergies as of 02/24/2023       Reactions   Vicodin [hydrocodone-acetaminophen] Hives, Rash        Medication List     STOP taking these medications    Flovent HFA 220 MCG/ACT inhaler Generic drug: fluticasone       TAKE these medications    albuterol 108 (90 Base) MCG/ACT inhaler Commonly known as: VENTOLIN HFA Inhale 2 puffs into the lungs every 6 (six) hours as needed for wheezing or shortness of breath.   amLODipine 10 MG tablet Commonly known as: NORVASC Take 1 tablet by mouth daily.   azithromycin 500 MG tablet Commonly known as: ZITHROMAX Take 1 tablet (500 mg total) by mouth daily for 3 days.   cetirizine 10 MG tablet Commonly known as: ZYRTEC Take 1 tablet (10 mg total) by mouth daily.   gabapentin 600 MG tablet Commonly known as: NEURONTIN Take 600 mg by mouth 3  (three) times daily.   guaiFENesin 600 MG 12 hr tablet Commonly known as: MUCINEX Take 1 tablet (600 mg total) by mouth 2 (two) times daily for 5 days.   lisinopril 20 MG tablet Commonly known as: ZESTRIL Take 1 tablet by mouth daily.   metFORMIN 500 MG tablet Commonly known as: GLUCOPHAGE Take 500 mg by mouth 2 (two) times daily with a meal.   oxyCODONE 5 MG immediate release tablet Commonly known as: Roxicodone Take 1 tablet (5 mg total) by mouth every 8 (eight) hours as needed.   pravastatin 40 MG tablet Commonly known as: PRAVACHOL Take 40 mg by mouth daily.   predniSONE 20 MG tablet Commonly known as: DELTASONE Take 2 tablets (40 mg total) by mouth daily with breakfast for 3 days. Start taking on: February 25, 2023   Stiolto Respimat 2.5-2.5 MCG/ACT Aers Generic drug: Tiotropium Bromide-Olodaterol SMARTSIG:2 Puff(s) Via Inhaler Every Morning       Allergies  Allergen Reactions   Vicodin [Hydrocodone-Acetaminophen] Hives and Rash   Discharge Instructions     Call MD for:  difficulty breathing, headache or visual disturbances   Complete by: As directed  Call MD for:  extreme fatigue   Complete by: As directed    Call MD for:  persistant dizziness or light-headedness   Complete by: As directed    Call MD for:  persistant nausea and vomiting   Complete by: As directed    Call MD for:  severe uncontrolled pain   Complete by: As directed    Call MD for:  temperature >100.4   Complete by: As directed    Diet - low sodium heart healthy   Complete by: As directed    Discharge instructions   Complete by: As directed    Follow-up with PCP in 1 week.  Follow-up for lung cancer screening as an outpatient Follow-up with pulmonary in 1 to 2 weeks for PFTs and lung cancer screening.   Increase activity slowly   Complete by: As directed        The results of significant diagnostics from this hospitalization (including imaging, microbiology, ancillary and  laboratory) are listed below for reference.    Significant Diagnostic Studies: ECHOCARDIOGRAM COMPLETE  Result Date: 02/23/2023    ECHOCARDIOGRAM REPORT   Patient Name:   Jimmy Bishop Date of Exam: 02/23/2023 Medical Rec #:  [id number]         Height:       69.0 in Accession #:    [id number]        Weight:       210.0 lb Date of Birth:  [birth year]         BSA:          2.109 m Patient Age:    62 years          BP:           139/93 mmHg Patient Gender: M                 HR:           62 bpm. Exam Location:  [facility] Procedure: 2D Echo, Cardiac Doppler and Color Doppler Indications:     Syncope  History:         Patient has no prior history of Echocardiogram examinations.                  COPD, Signs/Symptoms:Syncope; Risk Factors:Hypertension,                  Diabetes, Dyslipidemia and Current Smoker.  Sonographer:     Mikki Harbor Referring Phys:  [phone] Francoise Schaumann [place] Diagnosing Phys: Julien Nordmann MD IMPRESSIONS  1. Left ventricular ejection fraction, by estimation, is 60 to 65%. The left ventricle has normal function. The left ventricle has no regional wall motion abnormalities. There is mild left ventricular hypertrophy. Left ventricular diastolic parameters are indeterminate.  2. Right ventricular systolic function is normal. The right ventricular size is normal.  3. The mitral valve is normal in structure. Mild mitral valve regurgitation. No evidence of mitral stenosis.  4. The aortic valve has an indeterminant number of cusps. Aortic valve regurgitation is not visualized. No aortic stenosis is present.  5. The inferior vena cava is normal in size with greater than 50% respiratory variability, suggesting right atrial pressure of 3 mmHg.  6. Left atrial size was mildly dilated.  7. Tricuspid valve regurgitation is mild to moderate.  8. Frequent PVCs FINDINGS  Left Ventricle: Left ventricular ejection fraction, by estimation, is 60 to 65%. The left ventricle has normal function. The left ventricle has  no regional wall motion abnormalities.  The left ventricular internal cavity size was normal in size. There is  mild left ventricular hypertrophy. Left ventricular diastolic parameters are indeterminate. Right Ventricle: The right ventricular size is normal. No increase in right ventricular wall thickness. Right ventricular systolic function is normal. Left Atrium: Left atrial size was mildly dilated. Right Atrium: Right atrial size was normal in size. Pericardium: There is no evidence of pericardial effusion. Mitral Valve: The mitral valve is normal in structure. There is mild calcification of the mitral valve leaflet(s). Mild mitral valve regurgitation. No evidence of mitral valve stenosis. MV peak gradient, 4.7 mmHg. The mean mitral valve gradient is 1.0 mmHg. Tricuspid Valve: The tricuspid valve is normal in structure. Tricuspid valve regurgitation is mild to moderate. No evidence of tricuspid stenosis. Aortic Valve: The aortic valve has an indeterminant number of cusps. Aortic valve regurgitation is not visualized. No aortic stenosis is present. Aortic valve mean gradient measures 4.0 mmHg. Aortic valve peak gradient measures 7.2 mmHg. Aortic valve area, by VTI measures 3.04 cm. Pulmonic Valve: The pulmonic valve was normal in structure. Pulmonic valve regurgitation is not visualized. No evidence of pulmonic stenosis. Aorta: The aortic root is normal in size and structure. Venous: The inferior vena cava is normal in size with greater than 50% respiratory variability, suggesting right atrial pressure of 3 mmHg. IAS/Shunts: No atrial level shunt detected by color flow Doppler.  LEFT VENTRICLE PLAX 2D LVIDd:         5.30 cm   Diastology LVIDs:         3.20 cm   LV e' medial:   13.60 cm/s LV PW:         1.10 cm   LV E/e' medial: 6.6 LV IVS:        1.10 cm LVOT diam:     2.10 cm LV SV:         86 LV SV Index:   41 LVOT Area:     3.46 cm  RIGHT VENTRICLE RV Basal diam:  3.20 cm RV Mid diam:    3.60 cm RV S prime:      18.80 cm/s LEFT ATRIUM             Index        RIGHT ATRIUM           Index LA diam:        4.40 cm 2.09 cm/m   RA Area:     13.80 cm LA Vol (A2C):   81.8 ml 38.78 ml/m  RA Volume:   31.30 ml  14.84 ml/m LA Vol (A4C):   44.6 ml 21.14 ml/m LA Biplane Vol: 61.6 ml 29.20 ml/m  AORTIC VALVE                    PULMONIC VALVE AV Area (Vmax):    3.21 cm     PV Vmax:       1.27 m/s AV Area (Vmean):   2.97 cm     PV Peak grad:  6.5 mmHg AV Area (VTI):     3.04 cm AV Vmax:           134.00 cm/s AV Vmean:          92.550 cm/s AV VTI:            0.281 m AV Peak Grad:      7.2 mmHg AV Mean Grad:      4.0 mmHg LVOT Vmax:  124.00 cm/s LVOT Vmean:        79.300 cm/s LVOT VTI:          0.247 m LVOT/AV VTI ratio: 0.88  AORTA Ao Root diam: 3.70 cm MITRAL VALVE MV Area (PHT): 4.33 cm    SHUNTS MV Area VTI:   2.50 cm    Systemic VTI:  0.25 m MV Peak grad:  4.7 mmHg    Systemic Diam: 2.10 cm MV Mean grad:  1.0 mmHg MV Vmax:       1.08 m/s MV Vmean:      51.3 cm/s MV Decel Time: 175 msec MV E velocity: 90.10 cm/s MV A velocity: 93.80 cm/s MV E/A ratio:  0.96 Julien Nordmann MD Electronically signed by Julien Nordmann MD Signature Date/Time: 02/23/2023/12:10:36 PM    Final    DG Chest 2 View  Result Date: 02/23/2023 CLINICAL DATA:  6 syncope and fall. EXAM: CHEST - 2 VIEW COMPARISON:  09/09/2020 FINDINGS: The lungs are clear without focal pneumonia, edema, pneumothorax or pleural effusion. Interstitial markings are diffusely coarsened with chronic features. Cardiopericardial silhouette is at upper limits of normal for size. Old left rib fractures evident. Telemetry leads overlie the chest. IMPRESSION: Chronic interstitial coarsening without acute cardiopulmonary findings. Electronically Signed   By: Kennith Center M.D.   On: 02/23/2023 06:43   CT HEAD WO CONTRAST ( )  Result Date: 02/23/2023 CLINICAL DATA:  62 year old male with syncope and epistaxis. Dizziness. EXAM: CT HEAD WITHOUT CONTRAST TECHNIQUE: Contiguous  axial images were obtained from the base of the skull through the vertex without intravenous contrast. RADIATION DOSE REDUCTION: This exam was performed according to the departmental dose-optimization program which includes automated exposure control, adjustment of the mA and/or kV according to patient size and/or use of iterative reconstruction technique. COMPARISON:  Head CT 12/01/2019. FINDINGS: Brain: Cerebral volume not significantly changed and within normal limits for age. No midline shift, ventriculomegaly, mass effect, evidence of mass lesion, intracranial hemorrhage or evidence of cortically based acute infarction. Patchy and confluent bilateral cerebral white matter hypodensity is chronic but mildly progressed. Deep white matter capsules, deep gray nuclei and posterior fossa appear spared. Vascular: No suspicious intracranial vascular hyperdensity. Extensive Calcified atherosclerosis at the skull base. Skull: Stable and intact. Sinuses/Orbits: Tympanic cavities, Visualized paranasal sinuses and mastoids are stable and well aerated. Other: Chronic right vertex scalp scarring. Chronic left lateral scalp sebaceous type cyst. Negative orbits. IMPRESSION: 1. No acute intracranial abnormality. 2. Moderate for age cerebral white matter changes most commonly due to small vessel disease are progressed since 2021. 3.  Calcified atherosclerosis at the skull base. Electronically Signed   By: Odessa Fleming M.D.   On: 02/23/2023 06:13    Microbiology: No results found for this or any previous visit (from the past 240 hour(s)).   Labs: CBC: Recent Labs  Lab 02/23/23 0534 02/24/23 0613  WBC 6.6 6.3  HGB 14.2 13.9  HCT 42.6 42.3  MCV 91.8 92.2  PLT 363 346   Basic Metabolic Panel: Recent Labs  Lab 02/23/23 0534 02/24/23 0613  NA 138 136  K 4.0 4.4  CL 103 102  CO2 23 24  GLUCOSE 122* 144*  BUN 25* 18  CREATININE 1.07 0.83  CALCIUM 8.9 9.2  MG 2.0  --    Liver Function Tests: Recent Labs  Lab  02/24/23 0613  AST 19  ALT 19  ALKPHOS 50  BILITOT 0.6  PROT 6.8  ALBUMIN 3.6   No results for input(s): "LIPASE", "AMYLASE" in  the last 168 hours. No results for input(s): "AMMONIA" in the last 168 hours. Cardiac Enzymes: No results for input(s): "CKTOTAL", "CKMB", "CKMBINDEX", "TROPONINI" in the last 168 hours. BNP (last 3 results) No results for input(s): "BNP" in the last 8760 hours. CBG: Recent Labs  Lab 02/23/23 0831 02/23/23 1207 02/23/23 1614 02/24/23 0807 02/24/23 1136  GLUCAP 121* 135* 224* 139* 147*    Time spent: 35 minutes  Signed:  Gillis Santa  [facility] 02/24/2023 12:58 PM

## 2023-02-24 NOTE — Progress Notes (Signed)
   02/24/23 1000  Spiritual Encounters  Type of Visit Attempt (pt unavailable)  Reason for visit Advance directives  OnCall Visit Yes   Chaplain checked in on patient in response to [facility] consult. Patient sleeping.

## 2023-02-24 NOTE — TOC Transition Note (Signed)
Transition of Care [facility]) - CM/SW Discharge Note   Patient Details  Name: ABEM STPAUL MRN: [id number] Date of Birth: 12-Sep-1960  [facility] CM/SW Contact:  Chapman Fitch, RN Phone Number: 02/24/2023, 2:20 PM   Clinical Narrative:     Patient to discharge today Patient agreeable to RW.  Referral made to Jon with [employer] Patient is unable to get home health services at this time.  He does not have a PCP.  He states that he is working with his Medicaid to be assigned to [facility].  Patient states that once he is established with care he will follow up with them about home health if he still feels like it is indicated.    Patient states that brother will transport at discharge         Patient Goals and CMS Choice      Discharge Placement                         Discharge Plan and Services Additional resources added to the After Visit Summary for                                       Social Determinants of Health (SDOH) Interventions SDOH Screenings   Food Insecurity: No Food Insecurity (02/24/2023)  Housing: Low Risk  (02/24/2023)  Transportation Needs: No Transportation Needs (02/24/2023)  Utilities: Not At Risk (02/24/2023)  Financial Resource Strain: Low Risk  (05/24/2022)   Received from [facility], [facility]  Tobacco Use: High Risk (02/23/2023)     Readmission Risk Interventions     No data to display

## 2023-02-24 NOTE — ED Notes (Signed)
ED TO INPATIENT HANDOFF REPORT  ED Nurse Name and Phone #: Ladona Ridgel RN [phone]  S Name/Age/Gender Jimmy Bishop 62 y.o. male Room/Bed: ED30A/ED30A  Code Status   Code Status: Full Code  Home/SNF/Other Home Patient oriented to: self, place, time, and situation Is this baseline? Yes   Triage Complete: Triage complete  Chief Complaint Syncope [R55]  Triage Note Pt to ED via [facility] c/o syncope and epistaxis. Pt reports passing out once on Saturday and one a couple weeks ago. Pt reports he wasn't feeling sick, dizzy, partaking in strenuous activity or having any pain when he passed out both times. Pt also complaining of nosebleeds for past 3 days. Pt does not take any blood thinners, no bleeding at this time. Pt A&Ox4, answering all questions appropriately   Allergies Allergies  Allergen Reactions   Vicodin [Hydrocodone-Acetaminophen] Hives and Rash    Level of Care/Admitting Diagnosis ED Disposition     ED Disposition  Admit   Condition  --   Comment  Hospital Area: [facility] [[id number]]  Level of Care: Telemetry Medical [104]  Covid Evaluation: Asymptomatic - no recent exposure (last 10 days) testing not required  Diagnosis: Syncope [206001]  Admitting Physician: Hannah Beat [[id number]]  Attending Physician: Hannah Beat [[id number]]          B Medical/Surgery History Past Medical History:  Diagnosis Date   Arthritis    left knee   COPD (chronic obstructive pulmonary disease) (HCC)    Diabetes mellitus without complication (HCC)    Hypertension    Past Surgical History:  Procedure Laterality Date   ANKLE SURGERY     COLONOSCOPY WITH PROPOFOL N/A 07/20/2017   Procedure: COLONOSCOPY WITH PROPOFOL;  Surgeon: Toledo, Boykin Nearing, MD;  Location: [facility] ENDOSCOPY;  Service: Gastroenterology;  Laterality: N/A;   IRRIGATION AND DEBRIDEMENT ABSCESS Right 09/09/2020   Procedure: IRRIGATION AND DEBRIDEMENT ABSCESS-RIGHT FOREARM;  Surgeon: Carolan Shiver, MD;  Location: [facility] ORS;  Service: General;  Laterality: Right;   POSTERIOR CERVICAL FUSION/FORAMINOTOMY N/A 04/18/2015   Procedure: Posterior cervical fusion with lateral mass fixation - Cervical three-Thoracic one, cervical laminectomy Cervical three-Cervical seven;  Surgeon: Tia Alert, MD;  Location: [facility];  Service: Neurosurgery;  Laterality: N/A;     A IV Location/Drains/Wounds Patient Lines/Drains/Airways Status     Active Line/Drains/Airways     Name Placement date Placement time Site Days   Peripheral IV 02/23/23 20 G Anterior;Left;Proximal Forearm 02/23/23  0538  Forearm  1            Intake/Output Last 24 hours  Intake/Output Summary (Last 24 hours) at 02/24/2023 0322 Last data filed at 02/24/2023 0052 Gross per 24 hour  Intake --  Output 1100 ml  Net -1100 ml    Labs/Imaging Results for orders placed or performed during the hospital encounter of 02/23/23 (from the past 48 hour(s))  Basic metabolic panel     Status: Abnormal   Collection Time: 02/23/23  5:34 AM  Result Value Ref Range   Sodium 138 135 - 145 mmol/L   Potassium 4.0 3.5 - 5.1 mmol/L   Chloride 103 98 - 111 mmol/L   CO2 23 22 - 32 mmol/L   Glucose, Bld 122 (H) 70 - 99 mg/dL    Comment: Glucose reference range applies only to samples taken after fasting for at least 8 hours.   BUN 25 (H) 8 - 23 mg/dL   Creatinine, Ser 0.10 0.61 - 1.24 mg/dL   Calcium  8.9 8.9 - 10.3 mg/dL   GFR, Estimated >20 >25 mL/min    Comment: (NOTE) Calculated using the CKD-EPI Creatinine Equation (2021)    Anion gap 12 5 - 15    Comment: Performed at [facility], [address] [place], [place] [address]  CBC     Status: None   Collection Time: 02/23/23  5:34 AM  Result Value Ref Range   WBC 6.6 4.0 - 10.5 K/uL   RBC 4.64 4.22 - 5.81 MIL/uL   Hemoglobin 14.2 13.0 - 17.0 g/dL   HCT 23.7 62.8 - 31.5 %   MCV 91.8 80.0 - 100.0 fL   MCH 30.6 26.0 - 34.0 pg   MCHC 33.3 30.0 - 36.0 g/dL    RDW 17.6 16.0 - 73.7 %   Platelets 363 150 - 400 K/uL   nRBC 0.0 0.0 - 0.2 %    Comment: Performed at [facility], [address] [place], [place] [address]  Protime-INR     Status: None   Collection Time: 02/23/23  5:34 AM  Result Value Ref Range   Prothrombin Time 13.2 11.4 - 15.2 seconds   INR 1.0 0.8 - 1.2    Comment: (NOTE) INR goal varies based on device and disease states. Performed at [facility], [address], [place], [place] [address]   Magnesium     Status: None   Collection Time: 02/23/23  5:34 AM  Result Value Ref Range   Magnesium 2.0 1.7 - 2.4 mg/dL    Comment: Performed at [facility], [address] [place], [place] [address]  Hemoglobin A1c     Status: Abnormal   Collection Time: 02/23/23  5:34 AM  Result Value Ref Range   Hgb A1c MFr Bld 5.9 (H) 4.8 - 5.6 %    Comment: (NOTE) Pre diabetes:          5.7%-6.4%  Diabetes:              >6.4%  Glycemic control for   <7.0% adults with diabetes    Mean Plasma Glucose 122.63 mg/dL    Comment: Performed at [facility], [address], [place], [place] [address]  CBG monitoring, ED     Status: Abnormal   Collection Time: 02/23/23  8:31 AM  Result Value Ref Range   Glucose-Capillary 121 (H) 70 - 99 mg/dL    Comment: Glucose reference range applies only to samples taken after fasting for at least 8 hours.  HIV Antibody (routine testing w rflx)     Status: None   Collection Time: 02/23/23  8:34 AM  Result Value Ref Range   HIV Screen 4th Generation wRfx Non Reactive Non Reactive    Comment: Performed at [facility], [address], [place], [place] [address]  CBG monitoring, ED     Status: Abnormal   Collection Time: 02/23/23 12:07 PM  Result Value Ref Range   Glucose-Capillary 135 (H) 70 - 99 mg/dL    Comment: Glucose reference range applies only to samples taken after fasting for at least 8 hours.  Urinalysis, Routine w reflex microscopic -Urine, Clean Catch      Status: Abnormal   Collection Time: 02/23/23  1:21 PM  Result Value Ref Range   Color, Urine STRAW (A) YELLOW   APPearance CLEAR (A) CLEAR   Specific Gravity, Urine 1.010 1.005 - 1.030   pH 7.0 5.0 - 8.0   Glucose, UA NEGATIVE NEGATIVE mg/dL  Hgb urine dipstick SMALL (A) NEGATIVE   Bilirubin Urine NEGATIVE NEGATIVE   Ketones, ur 5 (A) NEGATIVE mg/dL   Protein, ur NEGATIVE NEGATIVE mg/dL   Nitrite NEGATIVE NEGATIVE   Leukocytes,Ua NEGATIVE NEGATIVE   RBC / HPF 0-5 0 - 5 RBC/hpf   WBC, UA 0 0 - 5 WBC/hpf   Bacteria, UA NONE SEEN NONE SEEN   Squamous Epithelial / HPF 0 0 - 5 /HPF    Comment: Performed at [facility], [address] [place], [place] [address]  CBG monitoring, ED     Status: Abnormal   Collection Time: 02/23/23  4:14 PM  Result Value Ref Range   Glucose-Capillary 224 (H) 70 - 99 mg/dL    Comment: Glucose reference range applies only to samples taken after fasting for at least 8 hours.   ECHOCARDIOGRAM COMPLETE  Result Date: 02/23/2023    ECHOCARDIOGRAM REPORT   Patient Name:   Jimmy Bishop Date of Exam: 02/23/2023 Medical Rec #:  [id number]         Height:       69.0 in Accession #:    [id number]        Weight:       210.0 lb Date of Birth:  12-19-1960         BSA:          2.109 m Patient Age:    62 years          BP:           139/93 mmHg Patient Gender: M                 HR:           62 bpm. Exam Location:  [facility] Procedure: 2D Echo, Cardiac Doppler and Color Doppler Indications:     Syncope  History:         Patient has no prior history of Echocardiogram examinations.                  COPD, Signs/Symptoms:Syncope; Risk Factors:Hypertension,                  Diabetes, Dyslipidemia and Current Smoker.  Sonographer:     Mikki Harbor Referring Phys:  [phone] Francoise Schaumann NE[place]ON Diagnosing Phys: Julien Nordmann MD IMPRESSIONS  1. Left ventricular ejection fraction, by estimation, is 60 to 65%. The left ventricle has normal function. The left ventricle has no  regional wall motion abnormalities. There is mild left ventricular hypertrophy. Left ventricular diastolic parameters are indeterminate.  2. Right ventricular systolic function is normal. The right ventricular size is normal.  3. The mitral valve is normal in structure. Mild mitral valve regurgitation. No evidence of mitral stenosis.  4. The aortic valve has an indeterminant number of cusps. Aortic valve regurgitation is not visualized. No aortic stenosis is present.  5. The inferior vena cava is normal in size with greater than 50% respiratory variability, suggesting right atrial pressure of 3 mmHg.  6. Left atrial size was mildly dilated.  7. Tricuspid valve regurgitation is mild to moderate.  8. Frequent PVCs FINDINGS  Left Ventricle: Left ventricular ejection fraction, by estimation, is 60 to 65%. The left ventricle has normal function. The left ventricle has no regional wall motion abnormalities. The left ventricular internal cavity size was normal in size. There is  mild left ventricular hypertrophy. Left ventricular diastolic parameters are indeterminate. Right Ventricle: The right ventricular size is normal. No increase in right  ventricular wall thickness. Right ventricular systolic function is normal. Left Atrium: Left atrial size was mildly dilated. Right Atrium: Right atrial size was normal in size. Pericardium: There is no evidence of pericardial effusion. Mitral Valve: The mitral valve is normal in structure. There is mild calcification of the mitral valve leaflet(s). Mild mitral valve regurgitation. No evidence of mitral valve stenosis. MV peak gradient, 4.7 mmHg. The mean mitral valve gradient is 1.0 mmHg. Tricuspid Valve: The tricuspid valve is normal in structure. Tricuspid valve regurgitation is mild to moderate. No evidence of tricuspid stenosis. Aortic Valve: The aortic valve has an indeterminant number of cusps. Aortic valve regurgitation is not visualized. No aortic stenosis is present. Aortic  valve mean gradient measures 4.0 mmHg. Aortic valve peak gradient measures 7.2 mmHg. Aortic valve area, by VTI measures 3.04 cm. Pulmonic Valve: The pulmonic valve was normal in structure. Pulmonic valve regurgitation is not visualized. No evidence of pulmonic stenosis. Aorta: The aortic root is normal in size and structure. Venous: The inferior vena cava is normal in size with greater than 50% respiratory variability, suggesting right atrial pressure of 3 mmHg. IAS/Shunts: No atrial level shunt detected by color flow Doppler.  LEFT VENTRICLE PLAX 2D LVIDd:         5.30 cm   Diastology LVIDs:         3.20 cm   LV e' medial:   13.60 cm/s LV PW:         1.10 cm   LV E/e' medial: 6.6 LV IVS:        1.10 cm LVOT diam:     2.10 cm LV SV:         86 LV SV Index:   41 LVOT Area:     3.46 cm  RIGHT VENTRICLE RV Basal diam:  3.20 cm RV Mid diam:    3.60 cm RV S prime:     18.80 cm/s LEFT ATRIUM             Index        RIGHT ATRIUM           Index LA diam:        4.40 cm 2.09 cm/m   RA Area:     13.80 cm LA Vol (A2C):   81.8 ml 38.78 ml/m  RA Volume:   31.30 ml  14.84 ml/m LA Vol (A4C):   44.6 ml 21.14 ml/m LA Biplane Vol: 61.6 ml 29.20 ml/m  AORTIC VALVE                    PULMONIC VALVE AV Area (Vmax):    3.21 cm     PV Vmax:       1.27 m/s AV Area (Vmean):   2.97 cm     PV Peak grad:  6.5 mmHg AV Area (VTI):     3.04 cm AV Vmax:           134.00 cm/s AV Vmean:          92.550 cm/s AV VTI:            0.281 m AV Peak Grad:      7.2 mmHg AV Mean Grad:      4.0 mmHg LVOT Vmax:         124.00 cm/s LVOT Vmean:        79.300 cm/s LVOT VTI:          0.247 m LVOT/AV VTI ratio: 0.88  AORTA Ao  Root diam: 3.70 cm MITRAL VALVE MV Area (PHT): 4.33 cm    SHUNTS MV Area VTI:   2.50 cm    Systemic VTI:  0.25 m MV Peak grad:  4.7 mmHg    Systemic Diam: 2.10 cm MV Mean grad:  1.0 mmHg MV Vmax:       1.08 m/s MV Vmean:      51.3 cm/s MV Decel Time: 175 msec MV E velocity: 90.10 cm/s MV A velocity: 93.80 cm/s MV E/A ratio:  0.96  Julien Nordmann MD Electronically signed by Julien Nordmann MD Signature Date/Time: 02/23/2023/12:10:36 PM    Final    DG Chest 2 View  Result Date: 02/23/2023 CLINICAL DATA:  6 syncope and fall. EXAM: CHEST - 2 VIEW COMPARISON:  09/09/2020 FINDINGS: The lungs are clear without focal pneumonia, edema, pneumothorax or pleural effusion. Interstitial markings are diffusely coarsened with chronic features. Cardiopericardial silhouette is at upper limits of normal for size. Old left rib fractures evident. Telemetry leads overlie the chest. IMPRESSION: Chronic interstitial coarsening without acute cardiopulmonary findings. Electronically Signed   By: Kennith Center M.D.   On: 02/23/2023 06:43   CT HEAD WO CONTRAST ( )  Result Date: 02/23/2023 CLINICAL DATA:  62 year old male with syncope and epistaxis. Dizziness. EXAM: CT HEAD WITHOUT CONTRAST TECHNIQUE: Contiguous axial images were obtained from the base of the skull through the vertex without intravenous contrast. RADIATION DOSE REDUCTION: This exam was performed according to the departmental dose-optimization program which includes automated exposure control, adjustment of the mA and/or kV according to patient size and/or use of iterative reconstruction technique. COMPARISON:  Head CT 12/01/2019. FINDINGS: Brain: Cerebral volume not significantly changed and within normal limits for age. No midline shift, ventriculomegaly, mass effect, evidence of mass lesion, intracranial hemorrhage or evidence of cortically based acute infarction. Patchy and confluent bilateral cerebral white matter hypodensity is chronic but mildly progressed. Deep white matter capsules, deep gray nuclei and posterior fossa appear spared. Vascular: No suspicious intracranial vascular hyperdensity. Extensive Calcified atherosclerosis at the skull base. Skull: Stable and intact. Sinuses/Orbits: Tympanic cavities, Visualized paranasal sinuses and mastoids are stable and well aerated. Other:  Chronic right vertex scalp scarring. Chronic left lateral scalp sebaceous type cyst. Negative orbits. IMPRESSION: 1. No acute intracranial abnormality. 2. Moderate for age cerebral white matter changes most commonly due to small vessel disease are progressed since 2021. 3.  Calcified atherosclerosis at the skull base. Electronically Signed   By: Odessa Fleming M.D.   On: 02/23/2023 06:13    Pending Labs Unresulted Labs (From admission, onward)     Start     Ordered   02/24/23 0500  CBC  Tomorrow morning,   R        02/23/23 0750   02/24/23 0500  Comprehensive metabolic panel  Tomorrow morning,   R        02/23/23 0750            Vitals/Pain Today's Vitals   02/24/23 0200 02/24/23 0230 02/24/23 0300 02/24/23 0309  BP: (!) 143/91 (!) 145/80 (!) 147/68   Pulse: 72 80 70   Resp: 18 12 14    Temp:    98 F (36.7 C)  TempSrc:    Oral  SpO2: 96% 94% 96%   Weight:      Height:      PainSc:        Isolation Precautions No active isolations  Medications Medications  sodium chloride flush (NS) 0.9 % injection 3 mL (3 mLs Intravenous  Not Given 02/23/23 2140)  enoxaparin (LOVENOX) injection 47.5 mg (47.5 mg Subcutaneous Given 02/23/23 2149)  ondansetron (ZOFRAN) tablet 4 mg (has no administration in time range)    Or  ondansetron (ZOFRAN) injection 4 mg (has no administration in time range)  azithromycin (ZITHROMAX) 500 mg in sodium chloride 0.9 % 250 mL IVPB (0 mg Intravenous Stopped 02/23/23 1038)    Followed by  azithromycin (ZITHROMAX) tablet 500 mg (has no administration in time range)  methylPREDNISolone sodium succinate (SOLU-MEDROL) 125 mg/2 mL injection 125 mg (125 mg Intravenous Given 02/23/23 0838)    Followed by  predniSONE (DELTASONE) tablet 40 mg (has no administration in time range)  ipratropium-albuterol (DUONEB) 0.5-2.5 (3) MG/3ML nebulizer solution 3 mL (3 mLs Nebulization Not Given 02/24/23 0203)  albuterol (PROVENTIL) (2.5 MG/3ML) 0.083% nebulizer solution 2.5 mg (has no  administration in time range)  insulin aspart (novoLOG) injection 0-9 Units (3 Units Subcutaneous Given 02/23/23 1619)  oxyCODONE (Oxy IR/ROXICODONE) immediate release tablet 5 mg (5 mg Oral Given 02/23/23 1800)  0.9 %  sodium chloride infusion ( Intravenous New Bag/Given 02/23/23 2148)  LORazepam (ATIVAN) injection 0.5 mg (0.5 mg Intravenous Given 02/23/23 0546)  lactated ringers bolus 1,000 mL (0 mLs Intravenous Stopped 02/23/23 0744)    Mobility walks with person assist     Focused Assessments Cardiac Assessment Handoff:  Cardiac Rhythm: Normal sinus rhythm No results found for: "CKTOTAL", "CKMB", "CKMBINDEX", "TROPONINI" No results found for: "DDIMER" Does the Patient currently have chest pain? No    R Recommendations: See Admitting Provider Note  Report given to:   Additional Notes: Patient on 2 L Oxygen, using urinal at bedside

## 2023-02-24 NOTE — Progress Notes (Signed)
Nutrition Brief Note  RD consulted for assessment of nutritional requirements/ status.   Wt Readings from Last 15 Encounters:  02/23/23 95.3 kg  11/30/22 81.6 kg  05/07/22 81.6 kg  09/18/20 82.6 kg  09/09/20 82.6 kg  12/01/19 86.2 kg  02/01/19 79.4 kg  07/31/17 92.5 kg  07/20/17 92.5 kg  05/27/17 87.1 kg  06/12/16 87.1 kg  04/10/15 85.5 kg  01/25/15 86.2 kg  11/26/12 93 kg   Pt with medical history significant of COPD, type 2 diabetes, hypertension presenting with syncope.   Pt admitted with syncope.   Reviewed I/O's: -1.1 L x 24 hours  Spoke with pt at bedside, who was pleasant and in good spirits today. Pt just received his breakfast tray and reports he is hungry. Per pt, he generally has a good appetite, but did not eat much yesterday due to being in the ED. Pt generally consumes 3-4 meals per day (Breakfast: sausage, eggs, and cheese biscuit; Lunch: sandwich, Dinner: meat, starch, and vegetable). Pt consumes mainly convenience food items.   Pt denies any weight loss, reporting "I know I've gained weight". No wt loss noted over the past 10 months.   Pt reports concern over elevated blood sugars in the hospital. Discussed with pt acute stress response to illness and how that impacts blood sugars as well as how DM is treated differently in the hospital vs outpatient to account for this. At baseline, pt reports he does not check his blood sugars and was taking off DM meds a few years ago due to good control of blood sugars.   Discussed importance of good meal intake to promote healing. Pt appreciative of visit and had no further questions at this time. Pt hopeful to discharge home today.   Nutrition-Focused physical exam completed. Findings are no fat depletion, no muscle depletion, and no edema.    Medications reviewed and include 0.9% sodium chloride infusion @ 75 ml/hr.   Lab Results  Component Value Date   HGBA1C 5.9 (H) 02/23/2023   PTA DM medications are none.   Labs  reviewed: CBGS: 121-224 (inpatient orders for glycemic control are 0-9 units insulin aspart TID with meals).    Body mass index is 31.01 kg/m. Patient meets criteria for obesity, class I based on current BMI. Obesity is a complex, chronic medical condition that is optimally managed by a multidisciplinary care team. Weight loss is not an ideal goal for an acute inpatient hospitalization. However, if further work-up for obesity is warranted, consider outpatient referral to [place].    Current diet order is heart healthy/carb modified (liberalized to carb modified), patient is consuming approximately 100% of meals at this time. Labs and medications reviewed.   No nutrition interventions warranted at this time. If nutrition issues arise, please consult RD.   Levada Schilling, RD, LDN, CDCES Registered Dietitian III Certified Diabetes Care and Education Specialist Please refer to [facility] for RD and/or RD on-call/weekend/after hours pager

## 2023-02-24 NOTE — Plan of Care (Signed)
  Problem: Education: Goal: Knowledge of condition and prescribed therapy will improve Outcome: Progressing   Problem: Cardiac: Goal: Will achieve and/or maintain adequate cardiac output Outcome: Progressing   Problem: Physical Regulation: Goal: Complications related to the disease process, condition or treatment will be avoided or minimized Outcome: Progressing   Problem: Education: Goal: Ability to describe self-care measures that may prevent or decrease complications (Diabetes Survival Skills Education) will improve Outcome: Progressing Goal: Individualized Educational Video(s) Outcome: Progressing   Problem: Coping: Goal: Ability to adjust to condition or change in health will improve Outcome: Progressing   Problem: Fluid Volume: Goal: Ability to maintain a balanced intake and output will improve Outcome: Progressing   Problem: Health Behavior/Discharge Planning: Goal: Ability to identify and utilize available resources and services will improve Outcome: Progressing Goal: Ability to manage health-related needs will improve Outcome: Progressing   Problem: Metabolic: Goal: Ability to maintain appropriate glucose levels will improve Outcome: Progressing   Problem: Nutritional: Goal: Maintenance of adequate nutrition will improve Outcome: Progressing Goal: Progress toward achieving an optimal weight will improve Outcome: Progressing   Problem: Skin Integrity: Goal: Risk for impaired skin integrity will decrease Outcome: Progressing   Problem: Tissue Perfusion: Goal: Adequacy of tissue perfusion will improve Outcome: Progressing   Problem: Education: Goal: Knowledge of General Education information will improve Description: Including pain rating scale, medication(s)/side effects and non-pharmacologic comfort measures Outcome: Progressing   Problem: Health Behavior/Discharge Planning: Goal: Ability to manage health-related needs will improve Outcome:  Progressing

## 2023-02-24 NOTE — Progress Notes (Signed)
   02/24/23 1300  Spiritual Encounters  Type of Visit Initial  Care provided to: Patient  Reason for visit Advance directives  OnCall Visit No   Chaplain went to talk to the patient about ADR. The patient told me that he was being discharged today and that he would fill out the paper work and have it notarized at his bank. Chaplain services remain available as needed.

## 2023-02-24 NOTE — Progress Notes (Signed)
Six minute walk test on corridor, patient's oxygen saturation at 94% on room air at rest. Dropped to 88% upon exertion during six minute walk test on room air, returned to 94% quickly upon rest.  Cornell Barman Abayomi Pattison

## 2023-03-14 ENCOUNTER — Encounter: Payer: Self-pay | Admitting: Family Medicine

## 2023-03-14 ENCOUNTER — Other Ambulatory Visit: Payer: Self-pay | Admitting: Surgery

## 2023-03-14 ENCOUNTER — Ambulatory Visit: Payer: Medicaid Other | Admitting: Family Medicine

## 2023-03-14 VITALS — BP 118/74 | HR 102 | Temp 98.4°F | Ht 69.0 in | Wt 205.0 lb

## 2023-03-14 DIAGNOSIS — E119 Type 2 diabetes mellitus without complications: Secondary | ICD-10-CM | POA: Diagnosis not present

## 2023-03-14 DIAGNOSIS — J449 Chronic obstructive pulmonary disease, unspecified: Secondary | ICD-10-CM | POA: Diagnosis not present

## 2023-03-14 DIAGNOSIS — I10 Essential (primary) hypertension: Secondary | ICD-10-CM | POA: Diagnosis not present

## 2023-03-14 DIAGNOSIS — E785 Hyperlipidemia, unspecified: Secondary | ICD-10-CM | POA: Diagnosis not present

## 2023-03-14 DIAGNOSIS — Z7984 Long term (current) use of oral hypoglycemic drugs: Secondary | ICD-10-CM

## 2023-03-14 DIAGNOSIS — F17200 Nicotine dependence, unspecified, uncomplicated: Secondary | ICD-10-CM | POA: Diagnosis not present

## 2023-03-14 DIAGNOSIS — M79605 Pain in left leg: Secondary | ICD-10-CM | POA: Diagnosis not present

## 2023-03-14 DIAGNOSIS — J302 Other seasonal allergic rhinitis: Secondary | ICD-10-CM | POA: Diagnosis not present

## 2023-03-14 DIAGNOSIS — M25551 Pain in right hip: Secondary | ICD-10-CM | POA: Diagnosis not present

## 2023-03-14 DIAGNOSIS — Z7689 Persons encountering health services in other specified circumstances: Secondary | ICD-10-CM

## 2023-03-14 DIAGNOSIS — E11A Type 2 diabetes mellitus without complications in remission: Secondary | ICD-10-CM

## 2023-03-14 DIAGNOSIS — Z09 Encounter for follow-up examination after completed treatment for conditions other than malignant neoplasm: Secondary | ICD-10-CM | POA: Insufficient documentation

## 2023-03-14 DIAGNOSIS — G8929 Other chronic pain: Secondary | ICD-10-CM | POA: Diagnosis not present

## 2023-03-14 DIAGNOSIS — E1169 Type 2 diabetes mellitus with other specified complication: Secondary | ICD-10-CM

## 2023-03-14 MED ORDER — GABAPENTIN 600 MG PO TABS: 600.0000 mg | ORAL_TABLET | Freq: Three times a day (TID) | ORAL | 0 refills | Status: DC

## 2023-03-14 MED ORDER — LANCETS MISC. MISC: 1.0000 | Freq: Every day | 3 refills | Status: DC

## 2023-03-14 MED ORDER — BLOOD GLUCOSE TEST VI STRP: 1.0000 | ORAL_STRIP | Freq: Every day | 3 refills | Status: DC

## 2023-03-14 MED ORDER — LANCET DEVICE MISC: 1.0000 | Freq: Every day | 0 refills | Status: DC

## 2023-03-14 MED ORDER — METFORMIN HCL 500 MG PO TABS: 500.0000 mg | ORAL_TABLET | Freq: Two times a day (BID) | ORAL | 1 refills | Status: DC

## 2023-03-14 MED ORDER — STIOLTO RESPIMAT 2.5-2.5 MCG/ACT IN AERS: 2.0000 | INHALATION_SPRAY | Freq: Every day | RESPIRATORY_TRACT | 1 refills | Status: DC

## 2023-03-14 MED ORDER — BLOOD GLUCOSE MONITORING SUPPL DEVI: 1.0000 | Freq: Every day | 0 refills | Status: DC

## 2023-03-14 MED ORDER — CETIRIZINE HCL 10 MG PO TABS: 10.0000 mg | ORAL_TABLET | Freq: Every day | ORAL | 3 refills | Status: DC
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
## 2023-03-14 NOTE — Assessment & Plan Note (Addendum)
A1c 5.9 on 02/23/2023 Patient prefers to restart metformin due to high sugar intake. Discussed risks of hypoglycemia versus benefits of reducing vascular, renal, cardiac, and cerebral damage. - Restart metformin - Educate on monitoring for symptoms of hypoglycemia - Provide glucose meter, strips, lancets, and needles - Repeat A1c in three months

## 2023-03-15 LAB — SPECIMEN STATUS REPORT

## 2023-03-15 LAB — MICROALBUMIN / CREATININE URINE RATIO
Creatinine, Urine: 290 mg/dL
Microalb/Creat Ratio: 6 mg/g{creat} (ref 0–29)
Microalbumin, Urine: 16.4 ug/mL

## 2023-03-18 ENCOUNTER — Encounter: Payer: Self-pay | Admitting: Family Medicine
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
## 2023-03-18 NOTE — Assessment & Plan Note (Addendum)
Managed with pravastatin. Patient reports adherence to medication. - Continue pravastatin 40 mg nightly

## 2023-03-18 NOTE — Assessment & Plan Note (Signed)
Routine health maintenance discussed including medication refills and allergy management. - Refill cetirizine prescription - Order urine sample to check kidney function

## 2023-03-18 NOTE — Assessment & Plan Note (Addendum)
Well-controlled on current medication regimen including amlodipine. - Continue lisinopril 20 mg daily and amlodipine 10 mg daily

## 2023-03-18 NOTE — Assessment & Plan Note (Signed)
Patient has reduced smoking from one pack to half a pack per day and is attempting to quit. Discussed referral for low-dose CT scan for lung cancer screening due to long-term smoking history. - Encourage continued reduction in smoking - Refer for low-dose CT scan for lung cancer screening

## 2023-03-18 NOTE — Assessment & Plan Note (Signed)
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 Scheduled for hip replacement surgery on January 5th or 7th, 2024. - Proceed with hip replacement surgery as scheduled - Monitor post-operative recovery and follow-up as needed

## 2023-03-18 NOTE — Assessment & Plan Note (Signed)
COPD managed with albuterol inhaler as needed. Patient has not yet obtained the prescribed Stiolto inhaler. - Send prescription for Stiolto inhaler - Instruct patient to contact pharmacy if inhaler is not covered and check for alternative medications - Follow-up with pulmonology in January as scheduled

## 2023-03-18 NOTE — Assessment & Plan Note (Signed)
Well-controlled with cetirizine 10 mg daily. -Continue cetirizine unchanged

## 2023-03-22 DIAGNOSIS — M1611 Unilateral primary osteoarthritis, right hip: Secondary | ICD-10-CM | POA: Diagnosis not present

## 2023-03-25 ENCOUNTER — Inpatient Hospital Stay: Admission: RE | Admit: 2023-03-25 | Payer: Medicaid Other | Source: Ambulatory Visit

## 2023-03-29 ENCOUNTER — Ambulatory Visit: Admission: RE | Admit: 2023-03-29 | Payer: Medicaid Other | Source: Home / Self Care | Admitting: Surgery

## 2023-03-29 ENCOUNTER — Encounter: Admission: RE | Payer: Self-pay | Source: Home / Self Care

## 2023-03-29 SURGERY — ARTHROPLASTY, HIP, TOTAL,POSTERIOR APPROACH
Anesthesia: Choice | Site: Hip | Laterality: Right

## 2023-04-06 ENCOUNTER — Inpatient Hospital Stay: Payer: Medicaid Other | Admitting: Internal Medicine

## 2023-05-03 ENCOUNTER — Ambulatory Visit: Payer: Self-pay

## 2023-05-03 NOTE — Telephone Encounter (Signed)
  Chief Complaint: Right hip pain Symptoms: pain Frequency: ongoing Pertinent Negatives: Patient denies  Disposition: [] ED /[] Urgent Care (no appt availability in office) / [x] Appointment(In office/virtual)/ []  [facility]/ [] Home Care/ [] Refused Recommended Disposition /[] [facility]/ []  Follow-up with PCP Additional Notes: Pt has severe hip pain. Pt's total hip replacement sx was cancelled d/t insurance not paying for procedure. Pt is taking tramadol w/o any relief. Tramadol came from ortho. Made appt for tomorrow afternoon in office. Please advise if pain medication can be called in prior to tomorrow's appt.     Reason for Disposition  [1] SEVERE pain (e.g., excruciating, unable to do any normal activities) AND [2] not improved after 2 hours of pain medicine  Answer Assessment - Initial Assessment Questions 1. LOCATION and RADIATION: "Where is the pain located?"      Right hip pain 2. QUALITY: "What does the pain feel like?"  (e.g., sharp, dull, aching, burning)     Severe 4. ONSET: "When did the pain start?" "Does it come and go, or is it there all the time?"     Ongoing  6. CAUSE: "What do you think is causing the hip pain?"      Needs hip replacement  Protocols used: Hip Pain-A-AH

## 2023-05-04 ENCOUNTER — Ambulatory Visit: Payer: Medicaid Other | Admitting: Family Medicine

## 2023-05-10 ENCOUNTER — Other Ambulatory Visit: Payer: Self-pay | Admitting: Surgery

## 2023-05-16 ENCOUNTER — Encounter: Payer: Self-pay | Admitting: Surgery

## 2023-05-16 ENCOUNTER — Other Ambulatory Visit: Payer: Self-pay

## 2023-05-16 ENCOUNTER — Encounter
Admission: RE | Admit: 2023-05-16 | Discharge: 2023-05-16 | Disposition: A | Discharge status: Home / Self Care | Payer: Medicaid Other | Source: Ambulatory Visit | Attending: Surgery | Admitting: Surgery

## 2023-05-16 VITALS — BP 113/75 | HR 46 | Temp 97.6°F | Resp 20 | Ht 69.0 in | Wt 217.4 lb

## 2023-05-16 DIAGNOSIS — M1611 Unilateral primary osteoarthritis, right hip: Secondary | ICD-10-CM | POA: Diagnosis not present

## 2023-05-16 DIAGNOSIS — E119 Type 2 diabetes mellitus without complications: Secondary | ICD-10-CM | POA: Insufficient documentation

## 2023-05-16 DIAGNOSIS — Z01812 Encounter for preprocedural laboratory examination: Secondary | ICD-10-CM

## 2023-05-16 DIAGNOSIS — Z01818 Encounter for other preprocedural examination: Secondary | ICD-10-CM | POA: Insufficient documentation

## 2023-05-16 DIAGNOSIS — R9431 Abnormal electrocardiogram [ECG] [EKG]: Secondary | ICD-10-CM | POA: Diagnosis not present

## 2023-05-16 DIAGNOSIS — Z01810 Encounter for preprocedural cardiovascular examination: Secondary | ICD-10-CM | POA: Diagnosis not present

## 2023-05-16 LAB — SURGICAL PCR SCREEN
MRSA, PCR: NEGATIVE
Staphylococcus aureus: NEGATIVE

## 2023-05-16 LAB — TYPE AND SCREEN
ABO/RH(D): O POS
Antibody Screen: NEGATIVE

## 2023-05-16 LAB — URINALYSIS, ROUTINE W REFLEX MICROSCOPIC
Bilirubin Urine: NEGATIVE
Glucose, UA: NEGATIVE mg/dL
Hgb urine dipstick: NEGATIVE
Ketones, ur: NEGATIVE mg/dL
Leukocytes,Ua: NEGATIVE
Nitrite: NEGATIVE
Protein, ur: NEGATIVE mg/dL
Specific Gravity, Urine: 1.018 (ref 1.005–1.030)
pH: 6 (ref 5.0–8.0)

## 2023-05-16 NOTE — Patient Instructions (Addendum)
 Your procedure is scheduled on: March 4/ 2025, Tuesday Report to the Registration Desk on the 1st floor of the [facility] To find out your arrival time, please call [phone] between 1PM - 3PM on: Monday, March 3/ 2025  If your arrival time is 6:00 am, do not arrive before that time as the [facility] entrance doors do not open until 6:00 am.  REMEMBER: Instructions that are not followed completely may result in serious medical risk, up to and including death; or upon the discretion of your surgeon and anesthesiologist your surgery may need to be rescheduled.  Do not eat food after midnight the night before surgery.  No gum chewing or hard candies.  You may however, drink CLEAR liquids up to 2 hours before you are scheduled to arrive for your surgery. Do not drink anything within 2 hours of your scheduled arrival time.  Clear liquids include: - water   In addition, your doctor has ordered for you to drink the provided:  Gatorade G2 Drinking this carbohydrate drink up to two hours before surgery helps to reduce insulin resistance and improve patient outcomes. Please complete drinking 2 hours before scheduled arrival time.  One week prior to surgery: Stop Anti-inflammatories (NSAIDS) such as Advil, Aleve, Ibuprofen, Motrin, Naproxen, Naprosyn and Aspirin based products such as Excedrin, Goody's Powder, BC Powder. Stop ANY OVER THE COUNTER supplements until after surgery.  You may however, continue to take Tylenol if needed for pain up until the day of surgery.   metFORMIN (GLUCOPHAGE)- hold two days before surgery. Last dose is March 1, Saturday  Do not take lisinopril on day of surgery  Continue taking all of your other prescription medications up until the day of surgery.  ON THE DAY OF SURGERY ONLY TAKE THESE MEDICATIONS WITH SIPS OF WATER:  amLODipine (NORVASC)  cetirizine (ZYRTEC) gabapentin (NEURONTIN) oxycodone (OXY-IR) pravastatin (PRAVACHOL)    Use inhaler:   STIOLTO RESPIMAT on the day of surgery as prescribed  and bring to the hospital.    No Alcohol for 24 hours before or after surgery.  No Smoking including e-cigarettes for 24 hours before surgery.  No chewable tobacco products for at least 6 hours before surgery.  No nicotine patches on the day of surgery.  Do not use any "recreational" drugs for at least a week (preferably 2 weeks) before your surgery.  Please be advised that the combination of cocaine and anesthesia may have negative outcomes, up to and including death. If you test positive for cocaine, your surgery will be cancelled.  On the morning of surgery brush your teeth with toothpaste and water, you may rinse your mouth with mouthwash if you wish. Do not swallow any toothpaste or mouthwash.  Use CHG Soap or wipes as directed on instruction sheet.- Provided for you !  Do not wear jewelry, make-up, hairpins, clips or nail polish.  For welded (permanent) jewelry: bracelets, anklets, waist bands, etc.  Please have this removed prior to surgery.  If it is not removed, there is a chance that hospital personnel will need to cut it off on the day of surgery.  Do not wear lotions, powders, or perfumes.   Do not shave body hair from the neck down 48 hours before surgery.  Contact lenses, hearing aids and dentures may not be worn into surgery.  Do not bring valuables to the hospital. [facility] is not responsible for any missing/lost belongings or valuables.    Bring your C-PAP to the hospital in  case you may have to spend the night.   Notify your doctor if there is any change in your medical condition (cold, fever, infection).  Wear comfortable clothing (specific to your surgery type) to the hospital.  After surgery, you can help prevent lung complications by doing breathing exercises.  Take deep breaths and cough every 1-2 hours. Your doctor may order a device called an Incentive Spirometer to help you take deep  breaths.   If you are being admitted to the hospital overnight, leave your suitcase in the car. After surgery it may be brought to your room.   If you are being discharged the day of surgery, you will not be allowed to drive home. You will need a responsible individual to drive you home and stay with you for 24 hours after surgery.    Please call the Pre-admissions Testing Dept. at [phone] if you have any questions about these instructions.  Surgery Visitation Policy:  Patients having surgery or a procedure may have two visitors.  Children under the age of 49 must have an adult with them who is not the patient.  Temporary Visitor Restrictions Due to increasing cases of flu, RSV and COVID-19: Children ages 69 and under will not be able to visit patients in [facility] hospitals under most circumstances.  Inpatient Visitation:    Visiting hours are 7 a.m. to 8 p.m. Up to four visitors are allowed at one time in a patient room. The visitors may rotate out with other people during the day.  One visitor age 65 or older may stay with the patient overnight and must be in the room by 8 p.m.    Pre-operative 5 CHG Bath Instructions   You can play a key role in reducing the risk of infection after surgery. Your skin needs to be as free of germs as possible. You can reduce the number of germs on your skin by washing with CHG (chlorhexidine gluconate) soap before surgery. CHG is an antiseptic soap that kills germs and continues to kill germs even after washing.   DO NOT use if you have an allergy to chlorhexidine/CHG or antibacterial soaps. If your skin becomes reddened or irritated, stop using the CHG and notify one of our RNs at [phone].   Please shower with the CHG soap starting 4 days before surgery using the following schedule:        Please keep in mind the following:  DO NOT shave, including legs and underarms, starting the day of your first shower.   You may shave  your face at any point before/day of surgery.  Place clean sheets on your bed the day you start using CHG soap. Use a clean washcloth (not used since being washed) for each shower. DO NOT sleep with pets once you start using the CHG.   CHG Shower Instructions:  If you choose to wash your hair and private area, wash first with your normal shampoo/soap.  After you use shampoo/soap, rinse your hair and body thoroughly to remove shampoo/soap residue.  Turn the water OFF and apply about 3 tablespoons (45 ml) of CHG soap to a CLEAN washcloth.  Apply CHG soap ONLY FROM YOUR NECK DOWN TO YOUR TOES (washing for 3-5 minutes)  DO NOT use CHG soap on face, private areas, open wounds, or sores.  Pay special attention to the area where your surgery is being performed.  If you are having back surgery, having someone wash your back for you may be  helpful. Wait 2 minutes after CHG soap is applied, then you may rinse off the CHG soap.  Pat dry with a clean towel  Put on clean clothes/pajamas   If you choose to wear lotion, please use ONLY the CHG-compatible lotions on the back of this paper.     Additional instructions for the day of surgery: DO NOT APPLY any lotions, deodorants, cologne, or perfumes.   Put on clean/comfortable clothes.  Brush your teeth.  Ask your nurse before applying any prescription medications to the skin.      CHG Compatible Lotions   Aveeno Moisturizing lotion  Cetaphil Moisturizing Cream  Cetaphil Moisturizing Lotion  Clairol Herbal Essence Moisturizing Lotion, Dry Skin  Clairol Herbal Essence Moisturizing Lotion, Extra Dry Skin  Clairol Herbal Essence Moisturizing Lotion, Normal Skin  Curel Age Defying Therapeutic Moisturizing Lotion with Alpha Hydroxy  Curel Extreme Care Body Lotion  Curel Soothing Hands Moisturizing Hand Lotion  Curel Therapeutic Moisturizing Cream, Fragrance-Free  Curel Therapeutic Moisturizing Lotion, Fragrance-Free  Curel Therapeutic Moisturizing  Lotion, Original Formula  Eucerin Daily Replenishing Lotion  Eucerin Dry Skin Therapy Plus Alpha Hydroxy Crme  Eucerin Dry Skin Therapy Plus Alpha Hydroxy Lotion  Eucerin Original Crme  Eucerin Original Lotion  Eucerin Plus Crme Eucerin Plus Lotion  Eucerin TriLipid Replenishing Lotion  Keri Anti-Bacterial Hand Lotion  Keri Deep Conditioning Original Lotion Dry Skin Formula Softly Scented  Keri Deep Conditioning Original Lotion, Fragrance Free Sensitive Skin Formula  Keri Lotion Fast Absorbing Fragrance Free Sensitive Skin Formula  Keri Lotion Fast Absorbing Softly Scented Dry Skin Formula  Keri Original Lotion  Keri Skin Renewal Lotion Keri Silky Smooth Lotion  Keri Silky Smooth Sensitive Skin Lotion  Nivea Body Creamy Conditioning Oil  Nivea Body Extra Enriched Lotion  Nivea Body Original Lotion  Nivea Body Sheer Moisturizing Lotion Nivea Crme  Nivea Skin Firming Lotion  NutraDerm 30 Skin Lotion  NutraDerm Skin Lotion  NutraDerm Therapeutic Skin Cream  NutraDerm Therapeutic Skin Lotion  ProShield Protective Hand Cream  Provon moisturizing lotion  Preoperative Educational Videos for Total Hip, Knee and Shoulder Replacements  To better prepare for surgery, please view our videos that explain the physical activity and discharge planning required to have the best surgical recovery at [facility].  [email or web address]  Questions? Call [phone] or email [email or web address]      How to Use an Incentive Spirometer  An incentive spirometer is a tool that measures how well you are filling your lungs with each breath. Learning to take long, deep breaths using this tool can help you keep your lungs clear and active. This may help to reverse or lessen your chance of developing breathing (pulmonary) problems, especially infection. You may be asked to use a spirometer: After a surgery. If  you have a lung problem or a history of smoking. After a long period of time when you have been unable to move or be active. If the spirometer includes an indicator to show the highest number that you have reached, your health care provider or respiratory therapist will help you set a goal. Keep a log of your progress as told by your health care provider. What are the risks? Breathing too quickly may cause dizziness or cause you to pass out. Take your time so you do not get dizzy or light-headed. If you are in pain, you may need to take pain medicine before doing incentive spirometry. It is harder to take a deep breath if you are having pain. How to  use your incentive spirometer  Sit up on the edge of your bed or on a chair. Hold the incentive spirometer so that it is in an upright position. Before you use the spirometer, breathe out normally. Place the mouthpiece in your mouth. Make sure your lips are closed tightly around it. Breathe in slowly and as deeply as you can through your mouth, causing the piston or the ball to rise toward the top of the chamber. Hold your breath for 3-5 seconds, or for as long as possible. If the spirometer includes a coach indicator, use this to guide you in breathing. Slow down your breathing if the indicator goes above the marked areas. Remove the mouthpiece from your mouth and breathe out normally. The piston or ball will return to the bottom of the chamber. Rest for a few seconds, then repeat the steps 10 or more times. Take your time and take a few normal breaths between deep breaths so that you do not get dizzy or light-headed. Do this every 1-2 hours when you are awake. If the spirometer includes a goal marker to show the highest number you have reached (best effort), use this as a goal to work toward during each repetition. After each set of 10 deep breaths, cough a few times. This will help to make sure that your lungs are clear. If you have an incision on  your chest or abdomen from surgery, place a pillow or a rolled-up towel firmly against the incision when you cough. This can help to reduce pain while taking deep breaths and coughing. General tips When you are able to get out of bed: Walk around often. Continue to take deep breaths and cough in order to clear your lungs. Keep using the incentive spirometer until your health care provider says it is okay to stop using it. If you have been in the hospital, you may be told to keep using the spirometer at home. Contact a health care provider if: You are having difficulty using the spirometer. You have trouble using the spirometer as often as instructed. Your pain medicine is not giving enough relief for you to use the spirometer as told. You have a fever. Get help right away if: You develop shortness of breath. You develop a cough with bloody mucus from the lungs. You have fluid or blood coming from an incision site after you cough. Summary An incentive spirometer is a tool that can help you learn to take long, deep breaths to keep your lungs clear and active. You may be asked to use a spirometer after a surgery, if you have a lung problem or a history of smoking, or if you have been inactive for a long period of time. Use your incentive spirometer as instructed every 1-2 hours while you are awake. If you have an incision on your chest or abdomen, place a pillow or a rolled-up towel firmly against your incision when you cough. This will help to reduce pain. Get help right away if you have shortness of breath, you cough up bloody mucus, or blood comes from your incision when you cough. This information is not intended to replace advice given to you by your health care provider. Make sure you discuss any questions you have with your health care provider. Document Revised: 05/28/2019 Document Reviewed: 05/28/2019 Elsevier Patient Education  2023 [employer].

## 2023-05-17 DIAGNOSIS — E119 Type 2 diabetes mellitus without complications: Secondary | ICD-10-CM | POA: Diagnosis not present

## 2023-05-17 DIAGNOSIS — M1611 Unilateral primary osteoarthritis, right hip: Secondary | ICD-10-CM | POA: Diagnosis not present

## 2023-05-24 ENCOUNTER — Other Ambulatory Visit: Payer: Self-pay

## 2023-05-24 ENCOUNTER — Encounter: Payer: Self-pay | Admitting: Surgery

## 2023-05-24 ENCOUNTER — Encounter
Admission: RE | Disposition: A | Discharge status: Home / Self Care | Payer: Self-pay | Source: Home / Self Care | Attending: Surgery

## 2023-05-24 ENCOUNTER — Ambulatory Visit

## 2023-05-24 ENCOUNTER — Ambulatory Visit: Payer: Self-pay | Admitting: Urgent Care

## 2023-05-24 ENCOUNTER — Ambulatory Visit: Payer: Self-pay

## 2023-05-24 ENCOUNTER — Ambulatory Visit
Admission: RE | Admit: 2023-05-24 | Discharge: 2023-05-25 | Disposition: A | Discharge status: Home / Self Care | Payer: Medicaid Other | Attending: Surgery | Admitting: Surgery

## 2023-05-24 DIAGNOSIS — Z96641 Presence of right artificial hip joint: Secondary | ICD-10-CM

## 2023-05-24 DIAGNOSIS — J449 Chronic obstructive pulmonary disease, unspecified: Secondary | ICD-10-CM | POA: Diagnosis not present

## 2023-05-24 DIAGNOSIS — M1611 Unilateral primary osteoarthritis, right hip: Secondary | ICD-10-CM | POA: Insufficient documentation

## 2023-05-24 DIAGNOSIS — R600 Localized edema: Secondary | ICD-10-CM | POA: Diagnosis not present

## 2023-05-24 DIAGNOSIS — E119 Type 2 diabetes mellitus without complications: Secondary | ICD-10-CM | POA: Insufficient documentation

## 2023-05-24 DIAGNOSIS — A419 Sepsis, unspecified organism: Secondary | ICD-10-CM | POA: Diagnosis not present

## 2023-05-24 DIAGNOSIS — Z471 Aftercare following joint replacement surgery: Secondary | ICD-10-CM | POA: Diagnosis not present

## 2023-05-24 DIAGNOSIS — L03113 Cellulitis of right upper limb: Secondary | ICD-10-CM | POA: Diagnosis not present

## 2023-05-24 DIAGNOSIS — T8189XA Other complications of procedures, not elsewhere classified, initial encounter: Secondary | ICD-10-CM | POA: Diagnosis not present

## 2023-05-24 DIAGNOSIS — Z7984 Long term (current) use of oral hypoglycemic drugs: Secondary | ICD-10-CM | POA: Insufficient documentation

## 2023-05-24 DIAGNOSIS — Z01812 Encounter for preprocedural laboratory examination: Secondary | ICD-10-CM

## 2023-05-24 DIAGNOSIS — I10 Essential (primary) hypertension: Secondary | ICD-10-CM | POA: Diagnosis not present

## 2023-05-24 HISTORY — DX: Unilateral primary osteoarthritis, right hip: M16.11

## 2023-05-24 HISTORY — PX: TOTAL HIP ARTHROPLASTY: SHX124

## 2023-05-24 LAB — GLUCOSE, CAPILLARY
Glucose-Capillary: 94 mg/dL (ref 70–99)
Glucose-Capillary: 123 mg/dL — ABNORMAL HIGH (ref 70–99)
Glucose-Capillary: 171 mg/dL — ABNORMAL HIGH (ref 70–99)
Glucose-Capillary: 287 mg/dL — ABNORMAL HIGH (ref 70–99)
Glucose-Capillary: 156 mg/dL — ABNORMAL HIGH (ref 70–99)

## 2023-05-24 SURGERY — ARTHROPLASTY, HIP, TOTAL,POSTERIOR APPROACH
Anesthesia: Spinal | Site: Hip | Laterality: Right

## 2023-05-24 MED ORDER — IPRATROPIUM-ALBUTEROL 0.5-2.5 (3) MG/3ML IN SOLN
3.0000 mL | Freq: Once | RESPIRATORY_TRACT | Status: AC
  Administered 2023-05-24: 3 mL via RESPIRATORY_TRACT

## 2023-05-24 MED ORDER — KETOROLAC TROMETHAMINE 15 MG/ML IJ SOLN
7.5000 mg | Freq: Four times a day (QID) | INTRAMUSCULAR | Status: AC
  Administered 2023-05-24 – 2023-05-25 (×4): 7.5 mg via INTRAVENOUS
  Filled 2023-05-24 (×4): qty 1

## 2023-05-24 MED ORDER — DEXAMETHASONE SODIUM PHOSPHATE 10 MG/ML IJ SOLN
INTRAMUSCULAR | Status: DC | PRN
  Administered 2023-05-24: 10 mg via INTRAVENOUS

## 2023-05-24 MED ORDER — CEFAZOLIN SODIUM-DEXTROSE 2-4 GM/100ML-% IV SOLN
2.0000 g | Freq: Four times a day (QID) | INTRAVENOUS | Status: AC
  Administered 2023-05-24 (×2): 2 g via INTRAVENOUS
  Filled 2023-05-24 (×2): qty 100

## 2023-05-24 MED ORDER — BISACODYL 10 MG RE SUPP: 10.0000 mg | Freq: Every day | RECTAL | Status: DC | PRN

## 2023-05-24 MED ORDER — FENTANYL CITRATE (PF) 100 MCG/2ML IJ SOLN
INTRAMUSCULAR | Status: AC
  Filled 2023-05-24: qty 2

## 2023-05-24 MED ORDER — TRIAMCINOLONE ACETONIDE 40 MG/ML IJ SUSP
INTRAMUSCULAR | Status: AC
  Filled 2023-05-24: qty 2

## 2023-05-24 MED ORDER — UMECLIDINIUM BROMIDE 62.5 MCG/ACT IN AEPB
1.0000 | INHALATION_SPRAY | Freq: Every day | RESPIRATORY_TRACT | Status: DC
  Administered 2023-05-24 – 2023-05-25 (×2): 1 via RESPIRATORY_TRACT
  Filled 2023-05-24: qty 7

## 2023-05-24 MED ORDER — DOCUSATE SODIUM 100 MG PO CAPS
100.0000 mg | ORAL_CAPSULE | Freq: Two times a day (BID) | ORAL | Status: DC
Start: 2023-05-24 — End: 2023-05-25
  Administered 2023-05-24 – 2023-05-25 (×3): 100 mg via ORAL
  Filled 2023-05-24 (×3): qty 1

## 2023-05-24 MED ORDER — MIDAZOLAM HCL 5 MG/5ML IJ SOLN
INTRAMUSCULAR | Status: DC | PRN
  Administered 2023-05-24: 2 mg via INTRAVENOUS

## 2023-05-24 MED ORDER — INSULIN ASPART 100 UNIT/ML IJ SOLN
0.0000 [IU] | Freq: Three times a day (TID) | INTRAMUSCULAR | Status: DC
  Administered 2023-05-24: 8 [IU] via SUBCUTANEOUS
  Administered 2023-05-24: 3 [IU] via SUBCUTANEOUS
  Administered 2023-05-25: 5 [IU] via SUBCUTANEOUS
  Administered 2023-05-25: 3 [IU] via SUBCUTANEOUS
  Filled 2023-05-24 (×4): qty 1

## 2023-05-24 MED ORDER — HYDROMORPHONE HCL 1 MG/ML IJ SOLN: 0.5000 mg | INTRAMUSCULAR | Status: DC | PRN

## 2023-05-24 MED ORDER — ACETAMINOPHEN 10 MG/ML IV SOLN
INTRAVENOUS | Status: AC
Start: 2023-05-24 — End: ?
  Filled 2023-05-24: qty 100

## 2023-05-24 MED ORDER — BUPIVACAINE LIPOSOME 1.3 % IJ SUSP
INTRAMUSCULAR | Status: AC
  Filled 2023-05-24: qty 20

## 2023-05-24 MED ORDER — CHLORHEXIDINE GLUCONATE 0.12 % MT SOLN
15.0000 mL | Freq: Once | OROMUCOSAL | Status: AC
  Administered 2023-05-24: 15 mL via OROMUCOSAL

## 2023-05-24 MED ORDER — METOCLOPRAMIDE HCL 5 MG PO TABS: 5.0000 mg | ORAL_TABLET | Freq: Three times a day (TID) | ORAL | Status: DC | PRN

## 2023-05-24 MED ORDER — OXYCODONE HCL 5 MG/5ML PO SOLN: 5.0000 mg | Freq: Once | ORAL | Status: DC | PRN

## 2023-05-24 MED ORDER — VASOPRESSIN 20 UNIT/ML IV SOLN
INTRAVENOUS | Status: DC | PRN
  Administered 2023-05-24 (×4): 1 [IU] via INTRAVENOUS

## 2023-05-24 MED ORDER — LISINOPRIL 20 MG PO TABS
20.0000 mg | ORAL_TABLET | Freq: Every day | ORAL | Status: DC
  Administered 2023-05-25: 20 mg via ORAL
  Filled 2023-05-24: qty 1

## 2023-05-24 MED ORDER — EPHEDRINE 5 MG/ML INJ
INTRAVENOUS | Status: AC
Start: 2023-05-24 — End: ?
  Filled 2023-05-24: qty 5

## 2023-05-24 MED ORDER — KETOROLAC TROMETHAMINE 30 MG/ML IJ SOLN
INTRAMUSCULAR | Status: AC
  Filled 2023-05-24: qty 1

## 2023-05-24 MED ORDER — ACETAMINOPHEN 10 MG/ML IV SOLN: 1000.0000 mg | Freq: Once | INTRAVENOUS | Status: DC | PRN

## 2023-05-24 MED ORDER — VASOPRESSIN 20 UNIT/ML IV SOLN
INTRAVENOUS | Status: AC
  Filled 2023-05-24: qty 1

## 2023-05-24 MED ORDER — LIDOCAINE HCL (PF) 2 % IJ SOLN
INTRAMUSCULAR | Status: AC
  Filled 2023-05-24: qty 10

## 2023-05-24 MED ORDER — SODIUM CHLORIDE 0.9 % IV SOLN
INTRAVENOUS | Status: DC | PRN
  Administered 2023-05-24: 60 mL

## 2023-05-24 MED ORDER — SODIUM CHLORIDE 0.9 % IV SOLN: INTRAVENOUS | Status: AC

## 2023-05-24 MED ORDER — ONDANSETRON HCL 4 MG/2ML IJ SOLN: 4.0000 mg | Freq: Four times a day (QID) | INTRAMUSCULAR | Status: DC | PRN

## 2023-05-24 MED ORDER — ONDANSETRON HCL 4 MG/2ML IJ SOLN
INTRAMUSCULAR | Status: DC | PRN
  Administered 2023-05-24: 4 mg via INTRAVENOUS

## 2023-05-24 MED ORDER — PRAVASTATIN SODIUM 20 MG PO TABS
40.0000 mg | ORAL_TABLET | Freq: Every day | ORAL | Status: DC
  Administered 2023-05-25: 40 mg via ORAL
  Filled 2023-05-24: qty 2

## 2023-05-24 MED ORDER — ARFORMOTEROL TARTRATE 15 MCG/2ML IN NEBU
15.0000 ug | INHALATION_SOLUTION | Freq: Two times a day (BID) | RESPIRATORY_TRACT | Status: DC
  Administered 2023-05-25: 15 ug via RESPIRATORY_TRACT
  Filled 2023-05-24 (×3): qty 2

## 2023-05-24 MED ORDER — OXYCODONE HCL 5 MG PO TABS: 5.0000 mg | ORAL_TABLET | Freq: Once | ORAL | Status: DC | PRN

## 2023-05-24 MED ORDER — ACETAMINOPHEN 500 MG PO TABS
1000.0000 mg | ORAL_TABLET | Freq: Four times a day (QID) | ORAL | Status: AC
Start: 2023-05-24 — End: 2023-05-25
  Administered 2023-05-24 (×3): 1000 mg via ORAL
  Filled 2023-05-24 (×4): qty 2

## 2023-05-24 MED ORDER — MAGNESIUM HYDROXIDE 400 MG/5ML PO SUSP: 30.0000 mL | Freq: Every day | ORAL | Status: DC | PRN

## 2023-05-24 MED ORDER — AMLODIPINE BESYLATE 10 MG PO TABS
10.0000 mg | ORAL_TABLET | Freq: Every day | ORAL | Status: DC
  Administered 2023-05-25: 10 mg via ORAL
  Filled 2023-05-24: qty 1

## 2023-05-24 MED ORDER — KETOROLAC TROMETHAMINE 15 MG/ML IJ SOLN
INTRAMUSCULAR | Status: AC
  Filled 2023-05-24: qty 1

## 2023-05-24 MED ORDER — LORATADINE 10 MG PO TABS
10.0000 mg | ORAL_TABLET | Freq: Every day | ORAL | Status: DC
Start: 2023-05-25 — End: 2023-05-25
  Administered 2023-05-25: 10 mg via ORAL
  Filled 2023-05-24: qty 1

## 2023-05-24 MED ORDER — ORAL CARE MOUTH RINSE: 15.0000 mL | Freq: Once | OROMUCOSAL | Status: AC

## 2023-05-24 MED ORDER — BUPIVACAINE-EPINEPHRINE (PF) 0.5% -1:200000 IJ SOLN
INTRAMUSCULAR | Status: AC
  Filled 2023-05-24: qty 10

## 2023-05-24 MED ORDER — METFORMIN HCL 500 MG PO TABS
500.0000 mg | ORAL_TABLET | Freq: Two times a day (BID) | ORAL | Status: DC
  Administered 2023-05-24 – 2023-05-25 (×2): 500 mg via ORAL
  Filled 2023-05-24 (×2): qty 1

## 2023-05-24 MED ORDER — FENTANYL CITRATE (PF) 100 MCG/2ML IJ SOLN
25.0000 ug | INTRAMUSCULAR | Status: DC | PRN
Start: 2023-05-24 — End: 2023-05-24
  Administered 2023-05-24 (×2): 50 ug via INTRAVENOUS

## 2023-05-24 MED ORDER — SODIUM CHLORIDE 0.9 % IV SOLN: INTRAVENOUS | Status: DC

## 2023-05-24 MED ORDER — PROPOFOL 500 MG/50ML IV EMUL
INTRAVENOUS | Status: DC | PRN
  Administered 2023-05-24: 125 ug/kg/min via INTRAVENOUS

## 2023-05-24 MED ORDER — APIXABAN 2.5 MG PO TABS
2.5000 mg | ORAL_TABLET | Freq: Two times a day (BID) | ORAL | Status: DC
  Administered 2023-05-25: 2.5 mg via ORAL
  Filled 2023-05-24: qty 1

## 2023-05-24 MED ORDER — ONDANSETRON HCL 4 MG/2ML IJ SOLN
INTRAMUSCULAR | Status: AC
  Filled 2023-05-24: qty 2

## 2023-05-24 MED ORDER — PHENYLEPHRINE 80 MCG/ML (10ML) SYRINGE FOR IV PUSH (FOR BLOOD PRESSURE SUPPORT)
PREFILLED_SYRINGE | INTRAVENOUS | Status: AC
  Filled 2023-05-24: qty 10

## 2023-05-24 MED ORDER — EPHEDRINE 5 MG/ML INJ
INTRAVENOUS | Status: AC
  Filled 2023-05-24: qty 5

## 2023-05-24 MED ORDER — DEXAMETHASONE SODIUM PHOSPHATE 10 MG/ML IJ SOLN
INTRAMUSCULAR | Status: AC
  Filled 2023-05-24: qty 1

## 2023-05-24 MED ORDER — TRANEXAMIC ACID-NACL 1000-0.7 MG/100ML-% IV SOLN
INTRAVENOUS | Status: AC
  Filled 2023-05-24: qty 100

## 2023-05-24 MED ORDER — PHENYLEPHRINE 80 MCG/ML (10ML) SYRINGE FOR IV PUSH (FOR BLOOD PRESSURE SUPPORT)
PREFILLED_SYRINGE | INTRAVENOUS | Status: DC | PRN
  Administered 2023-05-24: 80 ug via INTRAVENOUS
  Administered 2023-05-24 (×3): 160 ug via INTRAVENOUS

## 2023-05-24 MED ORDER — EPHEDRINE SULFATE-NACL 50-0.9 MG/10ML-% IV SOSY
PREFILLED_SYRINGE | INTRAVENOUS | Status: DC | PRN
Start: 2023-05-24 — End: 2023-05-24
  Administered 2023-05-24 (×2): 10 mg via INTRAVENOUS
  Administered 2023-05-24: 5 mg via INTRAVENOUS

## 2023-05-24 MED ORDER — PROPOFOL 10 MG/ML IV BOLUS
INTRAVENOUS | Status: DC | PRN
  Administered 2023-05-24: 30 mg via INTRAVENOUS

## 2023-05-24 MED ORDER — CEFAZOLIN SODIUM-DEXTROSE 2-4 GM/100ML-% IV SOLN
INTRAVENOUS | Status: AC
  Filled 2023-05-24: qty 100

## 2023-05-24 MED ORDER — KETOROLAC TROMETHAMINE 15 MG/ML IJ SOLN
15.0000 mg | Freq: Once | INTRAMUSCULAR | Status: AC
  Administered 2023-05-24: 15 mg via INTRAVENOUS

## 2023-05-24 MED ORDER — CEFAZOLIN SODIUM-DEXTROSE 2-4 GM/100ML-% IV SOLN
2.0000 g | INTRAVENOUS | Status: AC
  Administered 2023-05-24: 2 g via INTRAVENOUS

## 2023-05-24 MED ORDER — PROPOFOL 1000 MG/100ML IV EMUL
INTRAVENOUS | Status: AC
  Filled 2023-05-24: qty 100

## 2023-05-24 MED ORDER — KETOROLAC TROMETHAMINE 30 MG/ML IJ SOLN
INTRAMUSCULAR | Status: DC | PRN
  Administered 2023-05-24: 30 mg via INTRAMUSCULAR

## 2023-05-24 MED ORDER — BUPIVACAINE HCL (PF) 0.5 % IJ SOLN
INTRAMUSCULAR | Status: AC
  Filled 2023-05-24: qty 10

## 2023-05-24 MED ORDER — CHLORHEXIDINE GLUCONATE 0.12 % MT SOLN
OROMUCOSAL | Status: AC
  Filled 2023-05-24: qty 15

## 2023-05-24 MED ORDER — TRANEXAMIC ACID-NACL 1000-0.7 MG/100ML-% IV SOLN
1000.0000 mg | INTRAVENOUS | Status: AC
  Administered 2023-05-24: 1000 mg via INTRAVENOUS

## 2023-05-24 MED ORDER — SODIUM CHLORIDE (PF) 0.9 % IJ SOLN
INTRAMUSCULAR | Status: AC
  Filled 2023-05-24: qty 40

## 2023-05-24 MED ORDER — METOCLOPRAMIDE HCL 5 MG/ML IJ SOLN: 5.0000 mg | Freq: Three times a day (TID) | INTRAMUSCULAR | Status: DC | PRN

## 2023-05-24 MED ORDER — 0.9 % SODIUM CHLORIDE (POUR BTL) OPTIME
TOPICAL | Status: DC | PRN
  Administered 2023-05-24: 1000 mL

## 2023-05-24 MED ORDER — MIDAZOLAM HCL 2 MG/2ML IJ SOLN
INTRAMUSCULAR | Status: AC
  Filled 2023-05-24: qty 2

## 2023-05-24 MED ORDER — ACETAMINOPHEN 325 MG PO TABS: 325.0000 mg | ORAL_TABLET | Freq: Four times a day (QID) | ORAL | Status: DC | PRN

## 2023-05-24 MED ORDER — ACETAMINOPHEN 10 MG/ML IV SOLN
INTRAVENOUS | Status: DC | PRN
  Administered 2023-05-24: 1000 mg via INTRAVENOUS

## 2023-05-24 MED ORDER — PHENYLEPHRINE HCL-NACL 20-0.9 MG/250ML-% IV SOLN
INTRAVENOUS | Status: DC | PRN
  Administered 2023-05-24: 25 ug/min via INTRAVENOUS

## 2023-05-24 MED ORDER — GABAPENTIN 300 MG PO CAPS
600.0000 mg | ORAL_CAPSULE | Freq: Three times a day (TID) | ORAL | Status: DC
  Administered 2023-05-24 – 2023-05-25 (×4): 600 mg via ORAL
  Filled 2023-05-24 (×4): qty 2

## 2023-05-24 MED ORDER — FLEET ENEMA RE ENEM: 1.0000 | ENEMA | Freq: Once | RECTAL | Status: DC | PRN

## 2023-05-24 MED ORDER — IPRATROPIUM-ALBUTEROL 0.5-2.5 (3) MG/3ML IN SOLN
RESPIRATORY_TRACT | Status: AC
  Filled 2023-05-24: qty 3

## 2023-05-24 MED ORDER — OXYCODONE HCL 5 MG PO TABS
ORAL_TABLET | ORAL | Status: AC
  Filled 2023-05-24: qty 2

## 2023-05-24 MED ORDER — DIPHENHYDRAMINE HCL 12.5 MG/5ML PO ELIX: 12.5000 mg | ORAL_SOLUTION | ORAL | Status: DC | PRN

## 2023-05-24 MED ORDER — SODIUM CHLORIDE 0.9 % IR SOLN
Status: DC | PRN
  Administered 2023-05-24: 3000 mL

## 2023-05-24 MED ORDER — PHENYLEPHRINE HCL-NACL 20-0.9 MG/250ML-% IV SOLN
INTRAVENOUS | Status: AC
  Filled 2023-05-24: qty 250

## 2023-05-24 MED ORDER — TRIAMCINOLONE ACETONIDE 40 MG/ML IJ SUSP
INTRAMUSCULAR | Status: DC | PRN
  Administered 2023-05-24: 80 mg

## 2023-05-24 MED ORDER — ONDANSETRON HCL 4 MG/2ML IJ SOLN: 4.0000 mg | Freq: Once | INTRAMUSCULAR | Status: DC | PRN

## 2023-05-24 MED ORDER — ONDANSETRON HCL 4 MG PO TABS
4.0000 mg | ORAL_TABLET | Freq: Four times a day (QID) | ORAL | Status: DC | PRN
Start: 2023-05-24 — End: 2023-05-25

## 2023-05-24 MED ORDER — OXYCODONE HCL 5 MG PO TABS
5.0000 mg | ORAL_TABLET | ORAL | Status: DC | PRN
  Administered 2023-05-24 (×2): 10 mg via ORAL
  Administered 2023-05-24: 5 mg via ORAL
  Administered 2023-05-24 – 2023-05-25 (×5): 10 mg via ORAL
  Filled 2023-05-24: qty 1
  Filled 2023-05-24 (×6): qty 2

## 2023-05-24 SURGICAL SUPPLY — 54 items
BLADE SAGITTAL WIDE XTHICK NO (BLADE) ×1 IMPLANT
BLADE SURG SZ20 CARB STEEL (BLADE) ×1 IMPLANT
CHLORAPREP W/TINT 26 (MISCELLANEOUS) ×1 IMPLANT
DRAPE IMP U-DRAPE 54X76 (DRAPES) IMPLANT
DRAPE INCISE IOBAN 66X60 STRL (DRAPES) ×1 IMPLANT
DRAPE SHEET LG 3/4 BI-LAMINATE (DRAPES) ×1 IMPLANT
DRAPE SURG 17X11 SM STRL (DRAPES) ×2 IMPLANT
DRSG MEPILEX SACRM 8.7X9.8 (GAUZE/BANDAGES/DRESSINGS) ×1 IMPLANT
DRSG OPSITE POSTOP 4X10 (GAUZE/BANDAGES/DRESSINGS) ×1 IMPLANT
ELECT BLADE 6.5 EXT (BLADE) IMPLANT
ELECT CAUTERY BLADE 6.4 (BLADE) ×1 IMPLANT
GAUZE 4X4 16PLY ~~LOC~~+RFID DBL (SPONGE) ×1 IMPLANT
GAUZE XEROFORM 1X8 LF (GAUZE/BANDAGES/DRESSINGS) ×1 IMPLANT
GLOVE BIO SURGEON STRL SZ7.5 (GLOVE) ×4 IMPLANT
GLOVE BIO SURGEON STRL SZ8 (GLOVE) ×4 IMPLANT
GLOVE BIOGEL PI IND STRL 8 (GLOVE) ×1 IMPLANT
GLOVE INDICATOR 8.0 STRL GRN (GLOVE) ×1 IMPLANT
GOWN STRL REUS W/ TWL LRG LVL3 (GOWN DISPOSABLE) ×1 IMPLANT
GOWN STRL REUS W/ TWL XL LVL3 (GOWN DISPOSABLE) ×1 IMPLANT
HANDLE YANKAUER SUCT OPEN TIP (MISCELLANEOUS) ×1 IMPLANT
HEAD CERAMIC BIOLOX 36 (Head) IMPLANT
HIP SHELL ACETAB 3H 56MM F (Shell) ×1 IMPLANT
HOLSTER ELECTROSUGICAL PENCIL (MISCELLANEOUS) ×1 IMPLANT
HOOD PEEL AWAY T7 (MISCELLANEOUS) ×3 IMPLANT
IV NS IRRIG 3000ML ARTHROMATIC (IV SOLUTION) ×1 IMPLANT
KIT TURNOVER KIT A (KITS) ×1 IMPLANT
LINER ACE G7 36 SZF HIGH WALL (Liner) IMPLANT
MANIFOLD NEPTUNE II (INSTRUMENTS) ×1 IMPLANT
NDL FILTER BLUNT 18X1 1/2 (NEEDLE) ×1 IMPLANT
NDL SAFETY ECLIPSE 18X1.5 (NEEDLE) ×2 IMPLANT
NDL SPNL 20GX3.5 QUINCKE YW (NEEDLE) ×1 IMPLANT
NEEDLE FILTER BLUNT 18X1 1/2 (NEEDLE) ×1 IMPLANT
NEEDLE SPNL 20GX3.5 QUINCKE YW (NEEDLE) ×1 IMPLANT
PACK HIP PROSTHESIS (MISCELLANEOUS) ×1 IMPLANT
PENCIL SMOKE EVACUATOR (MISCELLANEOUS) ×1 IMPLANT
PIN STEIN FIX 2.4X229 SNGL (WIRE) IMPLANT
PIN STEIN SMOOTH 3/16X9 (Pin) ×1 IMPLANT
PIN STEIN SMOOTH 4.8X9 (Pin) ×1 IMPLANT
PULSAVAC PLUS IRRIG FAN TIP (DISPOSABLE) ×1 IMPLANT
SHELL ACETAB HIP 3H 56MM F (Shell) IMPLANT
SPONGE T-LAP 18X18 ~~LOC~~+RFID (SPONGE) ×5 IMPLANT
STAPLER SKIN PROX 35W (STAPLE) ×1 IMPLANT
STEM COLLARLESS FULL 11X135 (Stem) IMPLANT
SUT TICRON 2-0 30IN 311381 (SUTURE) ×3 IMPLANT
SUT VIC AB 0 CT1 36 (SUTURE) ×1 IMPLANT
SUT VIC AB 1 CT1 36 (SUTURE) ×1 IMPLANT
SUT VIC AB 2-0 CT1 (SUTURE) ×3 IMPLANT
SYR 10ML LL (SYRINGE) ×1 IMPLANT
SYR 20ML LL LF (SYRINGE) ×1 IMPLANT
SYR 30ML LL (SYRINGE) ×2 IMPLANT
TIP FAN IRRIG PULSAVAC PLUS (DISPOSABLE) ×1 IMPLANT
TRAP FLUID SMOKE EVACUATOR (MISCELLANEOUS) ×2 IMPLANT
WATER STERILE IRR 1000ML POUR (IV SOLUTION) ×1 IMPLANT
WATER STERILE IRR 500ML POUR (IV SOLUTION) ×1 IMPLANT

## 2023-05-24 NOTE — TOC Progression Note (Signed)
 Transition of Care [facility]) - Progression Note    Patient Details  Name: Jimmy Bishop MRN: [id number] Date of Birth: 06-23-1960  Transition of Care [facility]) CM/SW Contact  Marlowe Sax, RN Phone Number: 05/24/2023, 3:49 PM  Clinical Narrative:     Patient is set up with [facility] for Home health services prior to surgery by surgeons office, [employer] to deliover a 3 in 1 to the bedside, he has a rolling walker from a previous hospital stay       Expected Discharge Plan and Services                                               Social Determinants of Health (SDOH) Interventions SDOH Screenings   Food Insecurity: No Food Insecurity (02/24/2023)  Housing: Low Risk  (02/24/2023)  Transportation Needs: No Transportation Needs (02/24/2023)  Utilities: Not At Risk (02/24/2023)  Depression (PHQ2-9): Low Risk  (03/14/2023)  Financial Resource Strain: Low Risk  (05/24/2022)   Received from [facility], [facility]  Tobacco Use: High Risk (05/24/2023)    Readmission Risk Interventions     No data to display

## 2023-05-24 NOTE — Discharge Summary (Signed)
 Physician Discharge Summary  Patient ID: Jimmy Bishop MRN: [id number] DOB/AGE: [birth year] 64 y.o.  Admit date: 05/24/2023 Discharge date: 05/25/2023  Admission Diagnoses:  Status post total hip replacement, right [Z96.641] Degenerative joint disease, right hip   Discharge Diagnoses: Patient Active Problem List   Diagnosis Date Noted   Status post total hip replacement, right 05/24/2023   Establishing care with new doctor, encounter for 03/14/2023   Nicotine dependence with current use 03/14/2023   Chronic leg pain 03/14/2023   Chronic right hip pain 03/14/2023   Seasonal allergies 03/14/2023   Syncope 02/23/2023   Right forearm cellulitis 09/09/2020   Dog bite 09/09/2020   COPD (chronic obstructive pulmonary disease) (HCC) 09/09/2020   Abscess of right forearm 09/09/2020   Hypertension    Severe sepsis (HCC)    HLD (hyperlipidemia)    Tobacco abuse    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   Consultants (if any):   Discharged Condition: Improved  Hospital Course: Jimmy Bishop is an 63 y.o. male who was admitted 05/24/2023 with a diagnosis of right hip degenerative joint disease and went to the operating room on 05/24/2023 and underwent the above named procedures.    Surgeries: Procedure(s): ARTHROPLASTY, HIP, TOTAL,POSTERIOR APPROACH on 05/24/2023 Patient tolerated the surgery well. Taken to PACU where he was stabilized and then transferred to the orthopedic floor.  Started on Eliquis 2.5mg  every 12  hrs. Heels elevated on bed with rolled towels. No evidence of DVT. Negative Homan. Physical therapy started on day #0 for gait training and transfer. OT started  day #1 for ADL and assisted devices.  Patient's IV was removed on POD1.  Implants: Biomet press-fit system with a # 11 lateral offset Echo femoral stem, a 56 mm acetabular shell with an E-poly hi-wall liner, and a 36 mm ceramic head with a -3 mm neck.   He was given perioperative antibiotics:  Anti-infectives (From admission, onward)    Start     Dose/Rate Route Frequency Ordered Stop   05/24/23 1400  ceFAZolin (ANCEF) IVPB 2g/100 mL premix        2 g 200 mL/hr over 30 Minutes Intravenous Every 6 hours 05/24/23 1050 05/24/23 2018   05/24/23 0615  ceFAZolin (ANCEF) IVPB 2g/100 mL premix        2 g 200 mL/hr over 30 Minutes Intravenous On call to O.R. 05/24/23 0865 05/24/23 7846     .  He was given sequential compression devices, early ambulation, and Eliquis for DVT prophylaxis.  He benefited maximally from the hospital stay and there were no complications.    Recent vital signs:  Vitals:   05/25/23 0004 05/25/23 0425  BP: 111/62 112/75  Pulse: 86 86  Resp: 18 18  Temp: 98 F (36.7 C) 98 F (36.7 C)  SpO2: 92% 94%    Recent laboratory studies:  Lab Results  Component Value Date   HGB 12.4 (L) 05/25/2023   HGB 13.9 02/24/2023   HGB 14.2 02/23/2023   Lab Results  Component Value Date   WBC 15.1 (H) 05/25/2023   PLT 343 05/25/2023   Lab Results  Component Value Date   INR 1.0 02/23/2023   Lab Results  Component Value Date   NA 134 (L) 05/25/2023   K 5.0 05/25/2023   CL 104 05/25/2023   CO2 23 05/25/2023   BUN 27 (H) 05/25/2023   CREATININE 1.08 05/25/2023   GLUCOSE 199 (H) 05/25/2023    Discharge Medications:   Allergies as of 05/25/2023       Reactions   Vicodin [hydrocodone-acetaminophen] Hives, Rash        Medication List     STOP taking these medications    oxycodone 5 MG capsule Commonly known as: OXY-IR Replaced by: oxyCODONE 5 MG immediate release tablet       TAKE these medications    acetaminophen 500 MG tablet Commonly known  as: TYLENOL Take 2 tablets (1,000 mg total) by mouth every 6 (six) hours.   amLODipine 10 MG tablet Commonly known as: NORVASC Take 10 mg by mouth daily.   apixaban 2.5 MG Tabs tablet Commonly known as: ELIQUIS Take 1 tablet (2.5 mg total) by mouth 2 (two) times daily.   ARTHRITIS STRENGTH BC POWDER PO Take 1 Package by mouth daily as needed (pain).   Blood Glucose Monitoring Suppl Devi 1 each by Does not apply route daily before breakfast. May substitute to any manufacturer covered by patient's insurance.   BLOOD GLUCOSE TEST STRIPS Strp 1 each by In Vitro route daily before breakfast. May substitute to any manufacturer covered by patient's insurance.   cetirizine 10 MG tablet Commonly known as: ZYRTEC Take 1 tablet (10 mg total) by mouth daily.   gabapentin 600 MG tablet Commonly known as: NEURONTIN Take 1 tablet (600 mg total) by mouth 3 (three) times daily.   Lancet Device Misc 1 each by Does not apply route daily before breakfast. May substitute to any manufacturer covered by patient's insurance.   Lancets Misc. Misc 1 each by Does not apply route daily before breakfast. May substitute to any manufacturer covered by patient's insurance.   lisinopril 20 MG tablet Commonly known as: ZESTRIL Take 20 mg by mouth daily.   metFORMIN 500 MG tablet Commonly known as: GLUCOPHAGE Take 1 tablet (500 mg total) by mouth 2 (two) times daily with a meal.   ondansetron 4 MG tablet Commonly known as: ZOFRAN Take 1 tablet (4 mg total) by mouth every 6 (six) hours as needed for nausea.   oxyCODONE 5 MG immediate release tablet Commonly known as: Oxy IR/ROXICODONE Take 1-2 tablets (5-10 mg total) by mouth every 4 (four) hours as needed for moderate pain (pain score 4-6). Replaces: oxycodone 5 MG capsule   pravastatin 40 MG tablet Commonly known as: PRAVACHOL Take 40 mg by mouth daily.   Stiolto Respimat 2.5-2.5 MCG/ACT Aers Generic drug: Tiotropium Bromide-Olodaterol Inhale  2 puffs into the lungs daily.               Durable Medical Equipment  (From admission, onward)           Start     Ordered   05/24/23 1051  DME 3 n 1  Once        05/24/23 1050   05/24/23 1051  DME Walker rolling  Once       Question Answer Comment  Walker: With 5 Inch Wheels   Patient needs a walker to treat with the following condition Status post total hip replacement, right      05/24/23 1050  Diagnostic Studies: DG HIP UNILAT W OR W/O PELVIS 2-3 VIEWS RIGHT Result Date: 05/24/2023 CLINICAL DATA:  Status post hip replacement EXAM: DG HIP (WITH OR WITHOUT PELVIS) 2-3V RIGHT COMPARISON:  None Available. FINDINGS: Right hip arthroplasty in expected alignment. No periprosthetic lucency or fracture. Recent postsurgical change includes air and edema in the soft tissues. Lateral skin staples in place. IMPRESSION: Right hip arthroplasty without immediate postoperative complication. Electronically Signed   By: Narda Rutherford M.D.   On: 05/24/2023 10:29   Disposition: Plan for discharge home today pending progress with PT.   Follow-up Information     Anson [place], PA-C Follow up in 14 day(s).   Specialty: Physician Assistant Why: Mindi Slicker information: [address] [place] [address] [phone]                 Signed: Meriel Pica PA-C 05/25/2023, 7:16 AM

## 2023-05-24 NOTE — Op Note (Signed)
 05/24/2023  9:38 AM  Patient:   Jimmy Bishop  Pre-Op Diagnosis:   Degenerative joint disease, right hip.  Post-Op Diagnosis:   Same.  Procedure:   Right total hip arthroplasty.  Surgeon:   Maryagnes Amos, MD  Assistant:   Horris Latino, PA-C  Anesthesia:   Spinal  Findings:   As above.  Complications:   None  EBL:   125 cc  Fluids:   400 cc crystalloid  UOP:   None cc  TT:   None  Drains:   None  Closure:   Staples  Implants:   Biomet press-fit system with a # 11 lateral offset Echo femoral stem, a 56 mm acetabular shell with an E-poly hi-wall liner, and a 36 mm ceramic head with a -3 mm neck.  Brief Clinical Note:   The patient is a 63 year old male with a long history of progressive worsening right hip pain. His symptoms have progressed despite medications, activity modification, etc. His history and examination are consistent with advanced degenerative joint disease, confirmed by plain radiographs. The patient presents at this time for a right total hip arthroplasty.   Procedure:   The patient was brought into the operating room. After adequate spinal anesthesia was obtained, the patient was repositioned in the left lateral decubitus position and secured using a lateral hip positioner. The right hip and lower extremity were prepped with ChloroPrep solution before being draped sterilely. Preoperative antibiotics were administered. A timeout was performed to verify the appropriate surgical site.    A standard posterior approach to the hip was made through an approximately 6-7 inch incision. The incision was carried down through the subcutaneous tissues to expose the gluteal fascia and proximal end of the iliotibial band. These structures were split the length of the incision and the Charnley self-retaining hip retractor placed. The bursal tissues were swept posteriorly to expose the short external rotators. The anterior border of the piriformis tendon was identified and  this plane developed down through the capsule to enter the joint. A flap of tissue was elevated off the posterior aspect of the femoral neck and greater trochanter and retracted posteriorly. This flap included the piriformis tendon, the short external rotators, and the posterior capsule. The soft tissues were elevated off the lateral aspect of the ilium and a large Steinmann pin placed bicortically.   With the right leg aligned over the left, a drill bit was placed into the greater trochanter parallel to the Steinmann pin and the distance between these two pins measured in order to optimize leg lengths postoperatively. The drill bit was removed and the hip dislocated. The piriformis fossa was debrided of soft tissues before the intramedullary canal was accessed through this point using a triple step reamer. The canal was reamed sequentially beginning with a #7 tapered reamer and progressing to a #11 tapered reamer. This provided excellent circumferential chatter. Using the appropriate guide, a femoral neck cut was made 10-12 mm above the lesser trochanter. The femoral head was removed.  Attention was directed to the acetabular side. The labrum was debrided circumferentially before the ligamentum teres was removed using a large curette. A line was drawn on the drapes corresponding to the native version of the acetabulum. This line was used as a guide while the acetabulum was reamed sequentially beginning with a 49 mm reamer and progressing to a 55 mm reamer. This provided excellent circumferential chatter. The 55 mm trial acetabulum was positioned and found to fit quite well. Therefore, the  56 mm acetabular shell was selected and impacted into place with care taken to maintain the appropriate version. The trial high wall liner was inserted.  Attention was redirected to the femoral side. A box osteotome was used to establish version before the canal was broached sequentially beginning with a #7 broach and  progressing to a #11 broach. This was left in place and several trial reductions performed using both the standard and laterally offset neck options, as well as the -6 mm, -3 mm, and +0 mm neck lengths. After removing the trial components, the "manhole cover" was placed into the apex of the acetabular shell and tightened securely. The permanent E-polyethylene hi-wall liner was impacted into the acetabular shell and its locking mechanism verified using a quarter-inch osteotome. Next, the #11 laterally offset femoral stem was impacted into place with care taken to maintain the appropriate version. A repeat trial reduction was performed using the -3 mm neck length. The -3 mm neck length demonstrated excellent stability both in extension and external rotation as well as with flexion to 90 and internal rotation beyond 70. It also was stable in the position of sleep. In addition, leg lengths appeared to be restored appropriately, both by reassessing the position of the right leg over the left, as well as by measuring the distance between the Steinmann pin and the drill bit. The 36 mm ceramic head with the -3 mm neck was impacted onto the stem of the femoral component. The Morse taper locking mechanism was verified using manual distraction before the head was relocated and placed through a range of motion with the findings as described above.  The wound was copiously irrigated with sterile saline solution via the jet lavage system before the peri-incisional and pericapsular tissues were injected with a "cocktail" of 20 cc of Exparel, 30 cc of 0.5% Sensorcaine, 2 cc of Kenalog 40 (80 mg), and 30 mg of Toradol diluted out to 90 cc with normal saline to help with postoperative analgesia. The posterior flap was reapproximated to the posterior aspect of the greater trochanter using #2 Tycron interrupted sutures placed through drill holes. Several additional #2 Tycron interrupted sutures were used to reinforce this layer of  closure. The iliotibial band was reapproximated using #1 Vicryl interrupted sutures before the gluteal fascia was closed using a running #1 Vicryl suture. At this point, 1 g of transexemic acid in 10 cc of normal saline was injected into the joint to help reduce postoperative bleeding. The subcutaneous tissues were closed in several layers using 2-0 Vicryl interrupted sutures before the skin was closed using staples. A sterile occlusive dressing was applied to the wound. The patient was then rolled back into the supine position on his/her hospital bed before being awakened and returned to the recovery room in satisfactory condition after tolerating the procedure well.

## 2023-05-24 NOTE — Anesthesia Procedure Notes (Signed)
 Procedure Name: MAC Date/Time: 05/24/2023 7:50 AM  Performed by: Lysbeth Penner, CRNAPre-anesthesia Checklist: Patient identified, Emergency Drugs available, Suction available, Patient being monitored and Timeout performed Patient Re-evaluated:Patient Re-evaluated prior to induction Oxygen Delivery Method: Simple face mask Preoxygenation: Pre-oxygenation with 100% oxygen Induction Type: IV induction

## 2023-05-24 NOTE — Anesthesia Procedure Notes (Addendum)
 Spinal  Patient location during procedure: OR Reason for block: surgical anesthesia Staffing Performed: anesthesiologist and other anesthesia staff  Anesthesiologist: Corinda Gubler, MD Other anesthesia staff: Susy Manor, RN Performed by: Corinda Gubler, MD Authorized by: Corinda Gubler, MD   Preanesthetic Checklist Completed: patient identified, IV checked, site marked, risks and benefits discussed, surgical consent, monitors and equipment checked, pre-op evaluation and timeout performed Spinal Block Patient position: sitting Prep: ChloraPrep and site prepped and draped Patient monitoring: heart rate, continuous pulse ox, blood pressure and cardiac monitor Approach: midline Location: L3-4 Injection technique: single-shot Needle Needle type: Quincke  Needle gauge: 22 G Needle length: 9 cm Assessment Sensory level: T10 Events: CSF return Additional Notes One unsuccessful attempt by SRNA utilizing 25g [place], followed by successful attempt by MD utilizing 22g Quincke. Meticulous sterile technique used throughout (CHG prep, sterile gloves, sterile drape). Negative paresthesia. Negative blood return. Positive free-flowing CSF. Expiration date of kit checked and confirmed. Patient tolerated procedure well, without complications.

## 2023-05-24 NOTE — Plan of Care (Signed)

## 2023-05-24 NOTE — Evaluation (Signed)
 Physical Therapy Evaluation Patient Details Name: Jimmy Bishop MRN: [id number] DOB: 11-27-1960 Today's Date: 05/24/2023  History of Present Illness  Jimmy Bishop is a 62yoM who comes to [facility] on 05/24/23 for elective Rt THA wth Jimmy Bishop. Pt was scheduled for this in January but contracted the influenza virus. Pt here in December 2024 after several syncopal episodes at home. 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 At baseline pt lives with brother and mother, uses a SPC to AMB with lots of hip pain taking narcotic analgesia PTA.  Clinical Impression  Pt awake, ready for PT evaluation- pt says he would like to get up and try walking. Pain is 7/10. Supervision for bed mobility and transfers, AMB to bathroom for voiding, then a short AMB session <11ft. Sats WNL on room air resting, but ~87% post AMB, recovered on 2L/min [place], then returned to room air at end of session. [place] at side for PRN use. Will deliver handout tomorrow and try stairs training for home entry. Pt has a RW already, but will need a BSC for DC. WIll follow.       If plan is discharge home, recommend the following: A little help with walking and/or transfers;Assist for transportation;Assistance with cooking/housework;Help with stairs or ramp for entrance   Can travel by private vehicle        Equipment Recommendations BSC/3in1  Recommendations for Other Services       Functional Status Assessment Patient has had a recent decline in their functional status and demonstrates the ability to make significant improvements in function in a reasonable and predictable amount of time.     Precautions / Restrictions Precautions Precautions: Fall Restrictions Weight Bearing Restrictions Per Provider Order: Yes RLE Weight Bearing Per Provider Order: Weight bearing as tolerated      Mobility  Bed Mobility Overal bed mobility: Needs Assistance Bed Mobility: Supine to Sit      Supine to sit: Supervision     General bed mobility comments: high velocity thrust manuever    Transfers Overall transfer level: Needs assistance Equipment used: Rolling walker (2 wheels) Transfers: Sit to/from Stand Sit to Stand: Supervision           General transfer comment: fevent high velocity effort made with success    Ambulation/Gait Ambulation/Gait assistance: Supervision Gait Distance (Feet): 60 Feet Assistive device: Rolling walker (2 wheels)         General Gait Details: More antalgic than smooth and symetrical, but demonstrates safe RW use without cues.  Stairs            Wheelchair Mobility     Tilt Bed    Modified Rankin (Stroke Patients Only)       Balance                                             Pertinent Vitals/Pain Pain Assessment Pain Assessment: 0-10 Pain Score: 7  Pain Location: operative hip Pain Descriptors / Indicators: Aching Pain Intervention(s): Limited activity within patient's tolerance, Monitored during session, Premedicated before session    Home Living Family/patient expects to be discharged to:: Private residence Living Arrangements: Parent;Other relatives (brother) Available Help at Discharge: Family;Available 24 hours/day (brother worsk durign daytime, momhoem all time) Type of Home: House Home Access: Stairs to enter Entrance Stairs-Rails: Can reach both;Left;Right Entrance Stairs-Number of  Steps: 4-5 back of the house; 4 steps on front   Home Layout: One level Home Equipment: Grab bars - toilet;Cane - single point;Rolling Walker (2 wheels)      Prior Function Prior Level of Function : Independent/Modified Independent             Mobility Comments: SPC for all mobility; very minimal driving, ADLs Comments: MOD I/IND     Extremity/Trunk Assessment                Communication        Cognition                                         Cueing        General Comments      Exercises Other Exercises Other Exercises: amb TO TOILET FOR VOIDING Other Exercises: AMB to recliner, set up for sitting up   Assessment/Plan    PT Assessment Patient needs continued PT services  PT Problem List Decreased strength;Decreased activity tolerance;Decreased range of motion;Decreased balance;Decreased mobility;Decreased safety awareness;Decreased knowledge of precautions       PT Treatment Interventions DME instruction;Gait training;Stair training;Functional mobility training;Therapeutic activities;Therapeutic exercise;Balance training;Patient/family education    PT Goals (Current goals can be found in the Care Plan section)  Acute Rehab PT Goals Patient Stated Goal: DC to home with help from fam PT Goal Formulation: With patient Time For Goal Achievement: 06/07/23 Potential to Achieve Goals: Good    Frequency BID     Co-evaluation               AM-PAC PT "6 Clicks" Mobility  Outcome Measure Help needed turning from your back to your side while in a flat bed without using bedrails?: A Little Help needed moving from lying on your back to sitting on the side of a flat bed without using bedrails?: A Little Help needed moving to and from a bed to a chair (including a wheelchair)?: A Little Help needed standing up from a chair using your arms (e.g., wheelchair or bedside chair)?: A Little Help needed to walk in hospital room?: A Little Help needed climbing 3-5 steps with a railing? : A Little 6 Click Score: 18    End of Session Equipment Utilized During Treatment: Gait belt Activity Tolerance: Patient tolerated treatment well Patient left: in chair;with chair alarm set;with call bell/phone within reach Jimmy Bishop Communication: Mobility status PT Visit Diagnosis: Other abnormalities of gait and mobility (R26.89);Difficulty in walking, not elsewhere classified (R26.2)    Time: 1610-9604 PT Time Calculation (min) (ACUTE ONLY): 34  min   Charges:   PT Evaluation $PT Eval Moderate Complexity: 1 Mod PT Treatments $Gait Training: 8-22 mins $Therapeutic Activity: 8-22 mins PT General Charges $$ ACUTE PT VISIT: 1 Visit        3:44 PM, 05/24/23 Jimmy Bishop, PT, DPT Physical Therapist - [facility]  [phone] (ASCOM)    Vernis Cabacungan C 05/24/2023, 3:40 PM

## 2023-05-24 NOTE — Progress Notes (Signed)
 Patient is not able to walk the distance required to go the bathroom, or he/she is unable to safely negotiate stairs required to access the bathroom.  A 3in1 BSC will alleviate this problem

## 2023-05-24 NOTE — Anesthesia Preprocedure Evaluation (Signed)
 Anesthesia Evaluation  Patient identified by MRN, date of birth, ID band Patient awake    Reviewed: Allergy & Precautions, NPO status , Patient's Chart, lab work & pertinent test results  History of Anesthesia Complications Negative for: history of anesthetic complications  Airway Mallampati: III  TM Distance: >3 FB Neck ROM: Limited    Dental  (+) Edentulous Upper, Edentulous Lower   Pulmonary neg sleep apnea, COPD,  COPD inhaler, Current Smoker and Patient abstained from smoking.   Pulmonary exam normal breath sounds clear to auscultation       Cardiovascular Exercise Tolerance: Good METShypertension, Pt. on medications (-) CAD and (-) Past MI (-) dysrhythmias  Rhythm:Regular Rate:Normal - Systolic murmurs    Neuro/Psych negative neurological ROS  negative psych ROS   GI/Hepatic ,neg GERD  ,,(+)     (-) substance abuse    Endo/Other  diabetes, Oral Hypoglycemic Agents    Renal/GU negative Renal ROS     Musculoskeletal   Abdominal   Peds  Hematology   Anesthesia Other Findings Past Medical History: No date: Allergy No date: Arthritis     Comment:  left knee No date: COPD (chronic obstructive pulmonary disease) (HCC) No date: Diabetes mellitus without complication (HCC) No date: Hypertension No date: Primary osteoarthritis of right hip  Reproductive/Obstetrics                             Anesthesia Physical Anesthesia Plan  ASA: 2  Anesthesia Plan: Spinal   Post-op Pain Management: Ofirmev IV (intra-op)*   Induction: Intravenous  PONV Risk Score and Plan: 0 and Ondansetron, Dexamethasone, Propofol infusion, TIVA and Midazolam  Airway Management Planned: Natural Airway  Additional Equipment: None  Intra-op Plan:   Post-operative Plan:   Informed Consent: I have reviewed the patients History and Physical, chart, labs and discussed the procedure including the risks,  benefits and alternatives for the proposed anesthesia with the patient or authorized representative who has indicated his/her understanding and acceptance.       Plan Discussed with: CRNA and Surgeon  Anesthesia Plan Comments: (Discussed R/B/A of neuraxial anesthesia technique with patient: - rare risks of spinal/epidural hematoma, nerve damage, infection - Risk of PDPH - Risk of nausea and vomiting - Risk of conversion to general anesthesia and its associated risks, including sore throat, damage to lips/eyes/teeth/oropharynx, and rare risks such as cardiac and respiratory events. - Risk of allergic reactions  Discussed the role of CRNA in patient's perioperative care. Patient counseled on benefits of smoking cessation, and increased perioperative risks associated with continued smoking.  Patient voiced understanding.)       Anesthesia Quick Evaluation

## 2023-05-24 NOTE — Transfer of Care (Signed)
 Immediate Anesthesia Transfer of Care Note  Patient: Edmonia Lynch  Procedure(s) Performed: TOTAL HIP ARTHROPLASTY (Right: Hip)  Patient Location: PACU  Anesthesia Type:Spinal  Level of Consciousness: drowsy  Airway & Oxygen Therapy: Patient Spontanous Breathing and Patient connected to face mask oxygen  Post-op Assessment: Report given to RN and Post -op Vital signs reviewed and stable  Post vital signs: Reviewed and stable  Last Vitals:  Vitals Value Taken Time  BP 109/68 05/24/23 0948  Temp    Pulse 92 05/24/23 0951  Resp 18 05/24/23 0951  SpO2 98 % 05/24/23 0951  Vitals shown include unfiled device data.  Last Pain:  Vitals:   05/24/23 0614  TempSrc: Oral  PainSc: 10-Worst pain ever         Complications: There were no known notable events for this encounter.

## 2023-05-24 NOTE — Discharge Instructions (Signed)
Instructions after Total Hip Replacement     J. Jeffrey Poggi, M.D.  J. Lance , PA-C     Dept. of Orthopaedics & Sports Medicine  [facility]  [address]  [place], [place]  [address]  Phone: [phone]   Fax: [phone]    DIET: . Drink plenty of non-alcoholic fluids. . Resume your normal diet. Include foods high in fiber.  ACTIVITY:  . You may use crutches or a walker with weight-bearing as tolerated, unless instructed otherwise. . You may be weaned off of the walker or crutches by your Physical Therapist.  . Do NOT reach below the level of your knees or cross your legs until allowed.    . Continue doing gentle exercises. Exercising will reduce the pain and swelling, increase motion, and prevent muscle weakness.   . Please continue to use the TED compression stockings for 6 weeks. You may remove the stockings at night, but should reapply them in the morning. . Do not drive or operate any equipment until instructed.  WOUND CARE:  . Continue to use ice packs periodically to reduce pain and swelling. . Keep the incision clean and dry. . You may bathe or shower after the staples are removed at the first office visit following surgery.  MEDICATIONS: . You may resume your regular medications. . Please take the pain medication as prescribed on the medication. . Do not take pain medication on an empty stomach. . You have been given a prescription for a blood thinner to prevent blood clots. Please take the medication as instructed. (NOTE: After completing a 2 week course of Lovenox, take one Enteric-coated aspirin once a day.) . Pain medications and iron supplements can cause constipation. Use a stool softener (Senokot or Colace) on a daily basis and a laxative (dulcolax or miralax) as needed. . Do not drive or drink alcoholic beverages when taking pain medications.  CALL THE OFFICE FOR: . Temperature above 101 degrees . Excessive bleeding or drainage on the  dressing. . Excessive swelling, coldness, or paleness of the toes. . Persistent nausea and vomiting.  FOLLOW-UP:  . You should have an appointment to return to the office in 2 weeks after surgery. . Arrangements have been made for continuation of Physical Therapy (either home therapy or outpatient therapy).  

## 2023-05-24 NOTE — H&P (Signed)
 History of Present Illness: Jimmy Bishop is a 63 y.o. male who presents today for his surgical history and physical for upcoming right total hip arthroplasty. The patient has been evaluated the past and has been diagnosed with severe right hip osteoarthritic changes. The patient is scheduled to undergo a right total hip arthroplasty with Dr. Joice Lofts on 05/24/2023. The patient was scheduled to undergo this surgery in January however the patient was diagnosed with the flu and had to cancel surgery. He continues to report moderate to severe pain in the right hip which she rates as a 10 out of 10 in severity. He is taking oxycodone as needed for discomfort at this time. He has not suffered any new falls or trauma affecting the right lower extremity. He denies any numbness or tingling to the right lower extremity today's visit. The patient denies any personal history of heart attack or stroke. He does have a history of COPD, he does have an inhaler that he will use on occasion. Denies any history of DVT. The patient does have a history of diabetes, most recent A1c was 5.9.  Past Medical History: Allergies  COPD (chronic obstructive pulmonary disease) (CMS/HHS-HCC)  Hypertension  Type 2 diabetes mellitus (CMS/HHS-HCC)   Past Surgical History: Neck surgery 2015  COLONOSCOPY N/A 07/20/2017 (Dr. 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, rpt 3 yrs per [facility]KT)  Left ankle surgery (compound fracture - in the '00's)   Past Family History: Diabetes Father  Heart failure Father  High blood pressure (Hypertension) Father  Colon polyps Maternal Grandfather   Medications: ACCU-CHEK SOFTCLIX LANCETS lancets Use 1 each 3 (three) times daily  amLODIPine (NORVASC) 10 MG tablet Take 1 tablet by mouth once daily  cetirizine (ZYRTEC) 10 MG tablet Take 1 tablet by mouth once daily  ferrous sulfate 325 (65 FE) MG tablet Take 325 mg by mouth daily with breakfast  fluticasone propionate  (FLOVENT HFA) 220 mcg/actuation inhaler Inhale into the lungs 2 (two) times daily  gabapentin (NEURONTIN) 600 MG tablet Take 600 mg by mouth 3 (three) times daily  INCRUSE ELLIPTA 62.5 mcg/actuation DsDv inhalation unit INHALE 1 PUFF INTO THE LUNGS EVERY DAY AT THE SAME TIME EACH DAY  lisinopriL (ZESTRIL) 20 MG tablet Take 1 tablet by mouth once daily  metFORMIN (GLUCOPHAGE) 500 MG tablet Take by mouth 2 (two) times daily  oxyCODONE (ROXICODONE) 5 MG immediate release tablet Take 1 tablet (5 mg total) by mouth every 8 (eight) hours as needed 15 tablet 0  pravastatin (PRAVACHOL) 40 MG tablet Take 1 tablet by mouth once daily  PROAIR HFA 90 mcg/actuation inhaler as needed  tetrahydrozoline (VISINE) 0.05 % ophthalmic solution Apply 1 drop to eye Three (3) times a day.  tiotropium-olodateroL (STIOLTO RESPIMAT) 2.5-2.5 mcg/actuation inhaler once daily   Allergies: Hydrocodone-Acetaminophen Hives and Rash   Review of Systems:  A comprehensive 14 point ROS was performed, reviewed by me today, and the pertinent orthopaedic findings are documented in the HPI.  Physical Exam: BP (!) 140/76  Ht 172.7 cm (5\' 8" )  Wt 96.2 kg (212 lb)  BMI 32.23 kg/m  General/Constitutional: The patient appears to be well-nourished, well-developed, and in no acute distress. Neuro/Psych: Normal mood and affect, oriented to person, place and time. Eyes: Non-icteric. Pupils are equal, round, and reactive to light, and exhibit synchronous movement. ENT: Unremarkable. Lymphatic: No palpable adenopathy. Respiratory: Lungs clear to auscultation, Normal chest excursion, No wheezes, and Non-labored breathing Cardiovascular: Regular rate and rhythm.  No murmurs. and No edema, swelling or tenderness, except as noted in detailed exam. Integumentary: No impressive skin lesions present, except as noted in detailed exam. Musculoskeletal: Unremarkable, except as noted in detailed exam.  Hip exam: He has moderate pain with right  hip flexion, internal rotation, and external rotation. He exhibits flexion to 95 degrees, internal rotation to 10 degrees, and external rotation to 25 degrees on the right. He is neurovascularly intact to both lower extremities. He has negative sitting straight leg raises bilaterally.  X-rays/MRI/Lab data:  X-rays of the pelvis and right hip are available for review and have been reviewed by myself. These films demonstrate advanced degenerative joint disease of the right hip joint as manifest by superior joint space narrowing and femoral osteophyte formation. No lytic lesions or acute bony abnormalities are identified.  Impression: 1. Primary osteoarthritis of right hip. 2. Diabetes mellitus without complication (CMS/HHS-HCC).  Plan:  1. Treatment options were discussed today with the patient. 2. The patient is scheduled undergo a right total hip arthroplasty with Dr. Joice Lofts on 05/24/23. 3. The patient was instructed on the risk and benefits of surgical intervention and wishes to proceed at this time. 4. This document will serve as a surgical history and physical for the patient. 5. The patient will follow-up per standard postop protocol. They can call the clinic they have any questions, new symptoms develop or symptoms worsen.  The procedure was discussed with the patient, as were the potential risks (including bleeding, infection, nerve and/or blood vessel injury, persistent or recurrent pain, failure of the hardware, dislocation, leg length inequality, heterotopic ossification, need for further surgery, blood clots, strokes, heart attacks and/or arhythmias, pneumonia, etc.) and benefits. The patient states his understanding and wishes to proceed.    H&P reviewed and patient re-examined. No changes.

## 2023-05-25 ENCOUNTER — Telehealth (HOSPITAL_COMMUNITY): Payer: Self-pay

## 2023-05-25 ENCOUNTER — Other Ambulatory Visit (HOSPITAL_COMMUNITY): Payer: Self-pay

## 2023-05-25 ENCOUNTER — Encounter: Payer: Self-pay | Admitting: Surgery

## 2023-05-25 DIAGNOSIS — M1611 Unilateral primary osteoarthritis, right hip: Secondary | ICD-10-CM | POA: Diagnosis not present

## 2023-05-25 LAB — CBC
HCT: 38.3 % — ABNORMAL LOW (ref 39.0–52.0)
Hemoglobin: 12.4 g/dL — ABNORMAL LOW (ref 13.0–17.0)
MCH: 30.1 pg (ref 26.0–34.0)
MCHC: 32.4 g/dL (ref 30.0–36.0)
MCV: 93 fL (ref 80.0–100.0)
Platelets: 343 10*3/uL (ref 150–400)
RBC: 4.12 MIL/uL — ABNORMAL LOW (ref 4.22–5.81)
RDW: 13.4 % (ref 11.5–15.5)
WBC: 15.1 10*3/uL — ABNORMAL HIGH (ref 4.0–10.5)
nRBC: 0 % (ref 0.0–0.2)

## 2023-05-25 LAB — BASIC METABOLIC PANEL
Anion gap: 7 (ref 5–15)
BUN: 27 mg/dL — ABNORMAL HIGH (ref 8–23)
CO2: 23 mmol/L (ref 22–32)
Calcium: 8.9 mg/dL (ref 8.9–10.3)
Chloride: 104 mmol/L (ref 98–111)
Creatinine, Ser: 1.08 mg/dL (ref 0.61–1.24)
GFR, Estimated: >60 mL/min (ref >60)
Glucose, Bld: 199 mg/dL — ABNORMAL HIGH (ref 70–99)
Potassium: 5 mmol/L (ref 3.5–5.1)
Sodium: 134 mmol/L — ABNORMAL LOW (ref 135–145)

## 2023-05-25 LAB — GLUCOSE, CAPILLARY
Glucose-Capillary: 202 mg/dL — ABNORMAL HIGH (ref 70–99)
Glucose-Capillary: 170 mg/dL — ABNORMAL HIGH (ref 70–99)

## 2023-05-25 MED ORDER — ACETAMINOPHEN 500 MG PO TABS: 1000.0000 mg | ORAL_TABLET | Freq: Four times a day (QID) | ORAL | 0 refills | Status: DC

## 2023-05-25 MED ORDER — OXYCODONE HCL 5 MG PO TABS: 5.0000 mg | ORAL_TABLET | ORAL | 0 refills | Status: DC | PRN

## 2023-05-25 MED ORDER — ONDANSETRON HCL 4 MG PO TABS
4.0000 mg | ORAL_TABLET | Freq: Four times a day (QID) | ORAL | 0 refills | Status: DC | PRN
Start: 2023-05-25 — End: 2023-06-13

## 2023-05-25 MED ORDER — APIXABAN 2.5 MG PO TABS
2.5000 mg | ORAL_TABLET | Freq: Two times a day (BID) | ORAL | 0 refills | Status: DC
Start: 2023-05-25 — End: 2023-11-22

## 2023-05-25 NOTE — Progress Notes (Signed)
 Oxygen Saturation Note:    05/25/23 0855  Therapy Vitals  Pulse Rate (!) 108  Oxygen Therapy  SpO2 (!) 84 %  O2 Device Room Air  Patient Activity (if Appropriate) Ambulating    SpO2 on room air at rest = 90% SpO2 on room air while ambulating = 84% SpO2 on 2L while sitting, resting= 95% Pt desaturates on room air with mobility and requires supplemental oxygen to maintain a safe saturation level.    9:43 AM, 05/25/23 Rosamaria Lints, PT, DPT Physical Therapist - [facility]  [phone] [facility])

## 2023-05-25 NOTE — Progress Notes (Signed)
  Subjective: 1 Day Post-Op Procedure(s) (LRB): ARTHROPLASTY, HIP, TOTAL,POSTERIOR APPROACH (Right) Patient reports pain as mild.   Patient is well, and has had no acute complaints or problems Plan is to go Home after hospital stay. Negative for chest pain and shortness of breath Fever: no Gastrointestinal:Negative for nausea and vomiting  Objective: Vital signs in last 24 hours: Temp:  [97.6 F (36.4 C)-99.4 F (37.4 C)] 98 F (36.7 C) (03/05 0425) Pulse Rate:  [30-101] 86 (03/05 0425) Resp:  [15-23] 18 (03/05 0425) BP: (105-126)/(49-80) 112/75 (03/05 0425) SpO2:  [87 %-97 %] 94 % (03/05 0425)  Intake/Output from previous day:  Intake/Output Summary (Last 24 hours) at 05/25/2023 0714 Last data filed at 05/25/2023 0534 Gross per 24 hour  Intake 773.25 ml  Output 125 ml  Net 648.25 ml    Intake/Output this shift: No intake/output data recorded.  Labs: Recent Labs    05/25/23 0359  HGB 12.4*   Recent Labs    05/25/23 0359  WBC 15.1*  RBC 4.12*  HCT 38.3*  PLT 343   Recent Labs    05/25/23 0359  NA 134*  K 5.0  CL 104  CO2 23  BUN 27*  CREATININE 1.08  GLUCOSE 199*  CALCIUM 8.9   No results for input(s): "LABPT", "INR" in the last 72 hours.   EXAM General - Patient is Alert, Appropriate, and Oriented Extremity - ABD soft Neurovascular intact Dorsiflexion/Plantar flexion intact Incision: dressing C/D/I No cellulitis present Compartment soft Dressing/Incision - clean, dry, no drainage noted to the right hip honeycomb dressing. Motor Function - intact, moving foot and toes well on exam.  Abdomen with some mild distention, intact bowel sounds.  No pain with palpation.  Past Medical History:  Diagnosis Date   Allergy    Arthritis    left knee   COPD (chronic obstructive pulmonary disease) (HCC)    Diabetes mellitus without complication (HCC)    Hypertension    Primary osteoarthritis of right hip     Assessment/Plan: 1 Day Post-Op Procedure(s)  (LRB): ARTHROPLASTY, HIP, TOTAL,POSTERIOR APPROACH (Right) Principal Problem:   Status post total hip replacement, right  Estimated body mass index is 32.64 kg/m as calculated from the following:   Height as of this encounter: 5\' 9"  (1.753 m).   Weight as of this encounter: 100.2 kg. Advance diet Up with therapy D/C IV fluids when tolerating po intake.  Labs and vitals reviewed this AM, 101 temp last night no current fever. WBC 15.1. Up with therapy today Continue to work on a BM, currently passing gas. Plan for d/c home pending progress with PT.  DVT Prophylaxis - TED hose and Eliquis Weight-Bearing as tolerated to right leg  J. Horris Latino, PA-C [facility] 05/25/2023, 7:14 AM

## 2023-05-25 NOTE — Plan of Care (Signed)
  Problem: Clinical Measurements: Goal: Diagnostic test results will improve Outcome: Progressing   Problem: Activity: Goal: Risk for activity intolerance will decrease Outcome: Progressing   Problem: Nutrition: Goal: Adequate nutrition will be maintained Outcome: Progressing   Problem: Coping: Goal: Level of anxiety will decrease Outcome: Progressing   Problem: Elimination: Goal: Will not experience complications related to urinary retention Outcome: Progressing   Problem: Pain Managment: Goal: General experience of comfort will improve and/or be controlled Outcome: Progressing   Problem: Safety: Goal: Ability to remain free from injury will improve Outcome: Progressing   Problem: Coping: Goal: Ability to adjust to condition or change in health will improve Outcome: Progressing

## 2023-05-25 NOTE — Progress Notes (Signed)
 Physical Therapy Treatment Patient Details Name: Jimmy Bishop MRN: [id number] DOB: 06-01-1960 Today's Date: 05/25/2023   History of Present Illness Jimmy Bishop is a 62yoM who comes to [facility] on 05/24/23 for elective Rt THA wth Dr. Joice Lofts. Pt was scheduled for this in January but contracted the influenza virus. Pt here in December 2024 after several syncopal episodes at home. 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 At baseline pt lives with brother and mother, uses a SPC to AMB with lots of hip pain taking narcotic analgesia PTA.    PT Comments  Pt in bed on entry, room air ~90% Spo2. Pain meds in place. Pt ready to advance AMB, stairs training. Reviewed HEP handout with success. All mobility performed at supervision to modI, a few reminders to keep RW close by during transitions. Pt is able to transition to 2-point gait after concerted effort. PostAMB SpO2 84-85% even after time for recovery, RN made aware.  donned at end of session. Handoff to OT.  All PT goals have been achieved.     If plan is discharge home, recommend the following: A little help with walking and/or transfers;Assist for transportation;Assistance with cooking/housework;Help with stairs or ramp for entrance   Can travel by private vehicle        Equipment Recommendations  BSC/3in1    Recommendations for Other Services       Precautions / Restrictions Precautions Precautions: Fall Restrictions RLE Weight Bearing Per Provider Order: Weight bearing as tolerated     Mobility  Bed Mobility Overal bed mobility: Needs Assistance Bed Mobility: Supine to Sit     Supine to sit: Supervision     General bed mobility comments: high velocity thrust manuever    Transfers Overall transfer level: Needs assistance Equipment used: Rolling walker (2 wheels) Transfers: Sit to/from Stand Sit to Stand: Supervision                 Ambulation/Gait Ambulation/Gait assistance: Supervision Gait Distance (Feet): 140 Feet Assistive device: Rolling walker (2 wheels) Gait Pattern/deviations: WFL(Within Functional Limits) Gait velocity: 0.58m/s     General Gait Details: More antalgic than smooth and symetrical, but demonstrates safe RW use without cues.   Stairs Stairs: Yes Stairs assistance: Supervision Stair Management: Two rails, Alternating pattern, Step to pattern Number of Stairs: 8 General stair comments: kinda sorta following technique cues, but generally appears safe and successful   Wheelchair Mobility     Tilt Bed    Modified Rankin (Stroke Patients Only)       Balance                                            Communication    Cognition Arousal: Alert Behavior During Therapy: WFL for tasks assessed/performed                                    Cueing    Exercises Total Joint Exercises Quad Sets: AROM, Right, 10 reps, Supine Gluteal Sets: AROM, Right, 10 reps, Supine Heel Slides: AROM, Right, 10 reps, Supine    General Comments        Pertinent Vitals/Pain Pain Assessment Pain Assessment: No/denies pain Pain Score: 7  Pain Descriptors / Indicators: Aching Pain Intervention(s): Limited activity within patient's tolerance,  Monitored during session, Premedicated before session    Home Living                          Prior Function            PT Goals (current goals can now be found in the care plan section) Acute Rehab PT Goals Patient Stated Goal: DC to home with help from fam PT Goal Formulation: With patient Time For Goal Achievement: 06/07/23 Potential to Achieve Goals: Good Progress towards PT goals: Progressing toward goals    Frequency    BID      PT Plan      Co-evaluation              AM-PAC PT "6 Clicks" Mobility   Outcome Measure  Help needed turning from your back to your side while in a flat  bed without using bedrails?: A Little Help needed moving from lying on your back to sitting on the side of a flat bed without using bedrails?: A Little Help needed moving to and from a bed to a chair (including a wheelchair)?: A Little Help needed standing up from a chair using your arms (e.g., wheelchair or bedside chair)?: A Little Help needed to walk in hospital room?: A Little Help needed climbing 3-5 steps with a railing? : A Little 6 Click Score: 18    End of Session Equipment Utilized During Treatment: Gait belt Activity Tolerance: Patient tolerated treatment well Patient left: in chair;with chair alarm set;with call bell/phone within reach Nurse Communication: Mobility status PT Visit Diagnosis: Other abnormalities of gait and mobility (R26.89);Difficulty in walking, not elsewhere classified (R26.2)     Time: 1610-9604 PT Time Calculation (min) (ACUTE ONLY): 20 min  Charges:    $Gait Training: 8-22 mins $Therapeutic Exercise: 8-22 mins PT General Charges $$ ACUTE PT VISIT: 1 Visit                    9:10 AM, 05/25/23 Jimmy Bishop, PT, DPT Physical Therapist - [facility]  [phone] (ASCOM)    Jimmy Bishop 05/25/2023, 9:07 AM

## 2023-05-25 NOTE — Telephone Encounter (Signed)
 Pharmacy Patient Advocate Encounter  Insurance verification completed.    The patient is insured through [facility].     Ran test claim for Eliquis 2.5 mg and the current 30 day co-pay is $4.00.   This test claim was processed through [facility]- copay amounts may vary at other pharmacies due to pharmacy/plan contracts, or as the patient moves through the different stages of their insurance plan.

## 2023-05-25 NOTE — Progress Notes (Addendum)
 Occupational Therapy Evaluation Patient Details Name: Jimmy Bishop MRN: [id number] DOB: 1960-05-21 Today's Date: 05/25/2023   History of Present Illness   Jimmy Bishop is a 62yoM who comes to [facility] on 05/24/23 for elective Rt THA wth Dr. Joice Lofts. Pt was scheduled for this in January but contracted the influenza virus. Pt here in December 2024 after several syncopal episodes at home. 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 At baseline pt lives with brother and mother, uses a SPC to AMB with lots of hip pain taking narcotic analgesia PTA.     Clinical Impressions Pt seen for OT evaluation this date, POD#1 from above surgery. Handoff form PT at start of session. Pt was independent in all ADLs prior to surgery, however occasionally using SPC for MOBILITY/ADL due to hip pain. Pt is eager to return to PLOF with less pain and improved safety and independence. Pt currently requires MOD assist for LB dressing while in seated position due to precautions and limited experience with AD (reacher/sock aid). Pt able to recall 2/3 posterior total hip precautions at start of session. Pt completed standing grooming tasks at sink level ~8mins, with CGA to ensure pt safety. Pt instructed in posterior total hip precautions and how to implement, self care skills, falls prevention strategies, home/routines modifications, DME/AE for LB bathing and dressing tasks, and compression stocking mgt. At end of session, pt able to recall 3/3 posterior total hip precautions, pt provided with handout for greater recall post d/c. Pt would benefit from additional instruction in self care skills and techniques to help maintain precautions with or without assistive devices to support recall and carryover prior to discharge. OT will follow acutely.   During standing task 88-89% on room air, after pursed lip breathing pt recovered to 92%. Pt placed back on 1.5L via [place] at end of  session.     If plan is discharge home, recommend the following:   A little help with walking and/or transfers;A lot of help with bathing/dressing/bathroom;Assistance with cooking/housework;Help with stairs or ramp for entrance;Assist for transportation     Functional Status Assessment   Patient has had a recent decline in their functional status and demonstrates the ability to make significant improvements in function in a reasonable and predictable amount of time.     Equipment Recommendations   BSC/3in1     Recommendations for Other Services         Precautions/Restrictions   Precautions Precautions: Fall;Posterior Hip Precaution Booklet Issued: Yes (comment) Recall of Precautions/Restrictions: Intact Restrictions Weight Bearing Restrictions Per Provider Order: Yes RLE Weight Bearing Per Provider Order: Weight bearing as tolerated     Mobility Bed Mobility Overal bed mobility:  (NT pt in recliner pre/post session)                  Transfers Overall transfer level: Needs assistance Equipment used: Rolling walker (2 wheels) Transfers: Bed to chair/wheelchair/BSC Sit to Stand: Supervision           General transfer comment: No physcial assit required for STS from recliner      Balance Overall balance assessment: History of Falls, Needs assistance Sitting-balance support: Feet supported Sitting balance-Leahy Scale: Good Sitting balance - Comments: Dynamic sitting during LB dressing tasks with AD   Standing balance support: Single extremity supported, During functional activity   Standing balance comment: Stood at sink level for ~10 mins with RW, no LOB noted  ADL either performed or assessed with clinical judgement   ADL Overall ADL's : Needs assistance/impaired Eating/Feeding: Independent   Grooming: Wash/dry hands;Wash/dry face;Brushing hair;Supervision/safety;Standing (sink level tasks)                Lower Body Dressing: Moderate assistance;Adhering to hip precautions;With adaptive equipment Lower Body Dressing Details (indicate cue type and reason): with use of reacher and sock aid Toilet Transfer: Contact guard assist;Ambulation;Rolling walker (2 wheels) (simulated t/f)           Functional mobility during ADLs: Rolling walker (2 wheels);Contact guard assist General ADL Comments: Completed ADLs with CGA for safety + RW, pt completed sink level grooming tasks, standing for ~20mins with no seated breaks. Spo2 levels on RA: 88%-89% post standing grooming tasks. Recovered with pursed lip breathing quickly to 92%.     Vision         Perception         Praxis         Pertinent Vitals/Pain Pain Assessment Pain Assessment: 0-10 Pain Score: 7  Pain Location: operative hip Pain Descriptors / Indicators: Aching Pain Intervention(s): Limited activity within patient's tolerance, Monitored during session, Repositioned     Extremity/Trunk Assessment Upper Extremity Assessment Upper Extremity Assessment: Overall WFL for tasks assessed   Lower Extremity Assessment Lower Extremity Assessment: RLE deficits/detail;Defer to PT evaluation (s/p R total hip replacement)       Communication Communication Communication: No apparent difficulties   Cognition Arousal: Alert Behavior During Therapy: WFL for tasks assessed/performed Cognition: No apparent impairments             OT - Cognition Comments: Pt A/O x4 throughout, demonstrated good recall of precautions post eval.                 Following commands: Intact       Cueing  General Comments   Cueing Techniques: Verbal cues  Operative dressing D/I/C   Exercises Exercises: Other exercises Other Exercises Other Exercises:  (Edu: role of OT, post hip precautions, safe ADL completion with use of AD, safe RW management during amb/ADLs)   Shoulder Instructions      Home Living Family/patient expects to  be discharged to:: Private residence Living Arrangements: Parent;Other relatives Available Help at Discharge: Family;Available 24 hours/day Type of Home: House Home Access: Stairs to enter [employer] of Steps: 4-5 back of the house; 4 steps on front Entrance Stairs-Rails: Can reach both;Left;Right Home Layout: One level     Bathroom Shower/Tub: Chief Strategy Officer: Standard     Home Equipment: Grab bars - toilet;Cane - single point;Rolling Walker (2 wheels)          Prior Functioning/Environment Prior Level of Function : Independent/Modified Independent             Mobility Comments: SPC for all mobility; very minimal driving, ADLs Comments: MOD I/IND    OT Problem List: Decreased strength;Decreased activity tolerance;Decreased knowledge of use of DME or AE   OT Treatment/Interventions: Self-care/ADL training;Energy conservation;DME and/or AE instruction;Therapeutic activities      OT Goals(Current goals can be found in the care plan section)   Acute Rehab OT Goals Patient Stated Goal: to get better OT Goal Formulation: With patient Time For Goal Achievement: 06/08/23 Potential to Achieve Goals: Good ADL Goals Pt Will Perform Grooming: with modified independence;standing Pt Will Perform Lower Body Dressing: with modified independence;with adaptive equipment Pt Will Transfer to Toilet: with modified independence;grab bars;ambulating Pt Will Perform Toileting -  Clothing Manipulation and hygiene: with modified independence   OT Frequency:  Min 3X/week    Co-evaluation              AM-PAC OT "6 Clicks" Daily Activity     Outcome Measure Help from another person eating meals?: None Help from another person taking care of personal grooming?: A Lot Help from another person toileting, which includes using toliet, bedpan, or urinal?: A Little Help from another person bathing (including washing, rinsing, drying)?: A Lot Help from  another person to put on and taking off regular upper body clothing?: None Help from another person to put on and taking off regular lower body clothing?: A Lot 6 Click Score: 17   End of Session Equipment Utilized During Treatment: Gait belt;Rolling walker (2 wheels) Nurse Communication: Mobility status  Activity Tolerance: Patient tolerated treatment well Patient left: in chair;with call bell/phone within reach;with chair alarm set  OT Visit Diagnosis: Unsteadiness on feet (R26.81);Other abnormalities of gait and mobility (R26.89);History of falling (Z91.81);Muscle weakness (generalized) (M62.81)                Time: 1610-9604 OT Time Calculation (min): 28 min Charges:  OT Evaluation $OT Eval Low Complexity: 1 Low OT Treatments $Self Care/Home Management : 8-22 mins  Glenard Haring M.S. OTR/L  05/25/23, 10:42 AM

## 2023-05-25 NOTE — Plan of Care (Signed)

## 2023-05-26 NOTE — Anesthesia Postprocedure Evaluation (Signed)
 Anesthesia Post Note  Patient: Jimmy Bishop  Procedure(s) Performed: ARTHROPLASTY, HIP, TOTAL,POSTERIOR APPROACH (Right: Hip)  Patient location during evaluation: Nursing Unit Anesthesia Type: Spinal Level of consciousness: awake and alert and oriented Pain management: pain level controlled Vital Signs Assessment: post-procedure vital signs reviewed and stable Respiratory status: respiratory function stable Cardiovascular status: stable Postop Assessment: no headache, no backache, patient able to bend at knees, adequate PO intake, able to ambulate and no apparent nausea or vomiting Anesthetic complications: no   There were no known notable events for this encounter.   Last Vitals:  Vitals:   05/25/23 0855 05/25/23 1525  BP:  114/71  Pulse: (!) 108 91  Resp:  18  Temp:  36.8 C  SpO2: (!) 84% 95%    Last Pain:  Vitals:   05/25/23 1544  TempSrc:   PainSc: 7                  Zachary George

## 2023-05-28 DIAGNOSIS — Z7984 Long term (current) use of oral hypoglycemic drugs: Secondary | ICD-10-CM | POA: Diagnosis not present

## 2023-05-28 DIAGNOSIS — E119 Type 2 diabetes mellitus without complications: Secondary | ICD-10-CM | POA: Diagnosis not present

## 2023-05-28 DIAGNOSIS — E785 Hyperlipidemia, unspecified: Secondary | ICD-10-CM | POA: Diagnosis not present

## 2023-05-28 DIAGNOSIS — I10 Essential (primary) hypertension: Secondary | ICD-10-CM | POA: Diagnosis not present

## 2023-05-28 DIAGNOSIS — M1712 Unilateral primary osteoarthritis, left knee: Secondary | ICD-10-CM | POA: Diagnosis not present

## 2023-05-28 DIAGNOSIS — Z96641 Presence of right artificial hip joint: Secondary | ICD-10-CM | POA: Diagnosis not present

## 2023-05-28 DIAGNOSIS — Z9981 Dependence on supplemental oxygen: Secondary | ICD-10-CM | POA: Diagnosis not present

## 2023-05-28 DIAGNOSIS — Z981 Arthrodesis status: Secondary | ICD-10-CM | POA: Diagnosis not present

## 2023-05-28 DIAGNOSIS — F17200 Nicotine dependence, unspecified, uncomplicated: Secondary | ICD-10-CM | POA: Diagnosis not present

## 2023-05-28 DIAGNOSIS — J449 Chronic obstructive pulmonary disease, unspecified: Secondary | ICD-10-CM | POA: Diagnosis not present

## 2023-05-28 DIAGNOSIS — J302 Other seasonal allergic rhinitis: Secondary | ICD-10-CM | POA: Diagnosis not present

## 2023-05-28 DIAGNOSIS — Z7901 Long term (current) use of anticoagulants: Secondary | ICD-10-CM | POA: Diagnosis not present

## 2023-05-28 DIAGNOSIS — Z471 Aftercare following joint replacement surgery: Secondary | ICD-10-CM | POA: Diagnosis not present

## 2023-05-29 ENCOUNTER — Inpatient Hospital Stay

## 2023-05-29 ENCOUNTER — Inpatient Hospital Stay
Admission: EM | Admit: 2023-05-29 | Discharge: 2023-06-02 | DRG: 193 | Disposition: A | Discharge status: Home / Self Care | Attending: Internal Medicine | Admitting: Internal Medicine

## 2023-05-29 ENCOUNTER — Emergency Department

## 2023-05-29 ENCOUNTER — Other Ambulatory Visit: Payer: Self-pay

## 2023-05-29 DIAGNOSIS — I10 Essential (primary) hypertension: Secondary | ICD-10-CM | POA: Diagnosis present

## 2023-05-29 DIAGNOSIS — E669 Obesity, unspecified: Secondary | ICD-10-CM | POA: Diagnosis not present

## 2023-05-29 DIAGNOSIS — R7881 Bacteremia: Secondary | ICD-10-CM | POA: Diagnosis not present

## 2023-05-29 DIAGNOSIS — Z7901 Long term (current) use of anticoagulants: Secondary | ICD-10-CM | POA: Diagnosis not present

## 2023-05-29 DIAGNOSIS — J440 Chronic obstructive pulmonary disease with acute lower respiratory infection: Secondary | ICD-10-CM | POA: Diagnosis present

## 2023-05-29 DIAGNOSIS — R600 Localized edema: Secondary | ICD-10-CM | POA: Diagnosis present

## 2023-05-29 DIAGNOSIS — J40 Bronchitis, not specified as acute or chronic: Secondary | ICD-10-CM | POA: Diagnosis not present

## 2023-05-29 DIAGNOSIS — J441 Chronic obstructive pulmonary disease with (acute) exacerbation: Secondary | ICD-10-CM | POA: Diagnosis not present

## 2023-05-29 DIAGNOSIS — Y95 Nosocomial condition: Secondary | ICD-10-CM | POA: Diagnosis present

## 2023-05-29 DIAGNOSIS — R059 Cough, unspecified: Secondary | ICD-10-CM | POA: Diagnosis not present

## 2023-05-29 DIAGNOSIS — E785 Hyperlipidemia, unspecified: Secondary | ICD-10-CM | POA: Diagnosis not present

## 2023-05-29 DIAGNOSIS — E119 Type 2 diabetes mellitus without complications: Secondary | ICD-10-CM | POA: Diagnosis not present

## 2023-05-29 DIAGNOSIS — Z1152 Encounter for screening for COVID-19: Secondary | ICD-10-CM

## 2023-05-29 DIAGNOSIS — R079 Chest pain, unspecified: Secondary | ICD-10-CM | POA: Diagnosis not present

## 2023-05-29 DIAGNOSIS — B9689 Other specified bacterial agents as the cause of diseases classified elsewhere: Secondary | ICD-10-CM | POA: Diagnosis not present

## 2023-05-29 DIAGNOSIS — Z8249 Family history of ischemic heart disease and other diseases of the circulatory system: Secondary | ICD-10-CM | POA: Diagnosis not present

## 2023-05-29 DIAGNOSIS — J9601 Acute respiratory failure with hypoxia: Secondary | ICD-10-CM | POA: Diagnosis not present

## 2023-05-29 DIAGNOSIS — R0789 Other chest pain: Secondary | ICD-10-CM | POA: Diagnosis not present

## 2023-05-29 DIAGNOSIS — Z885 Allergy status to narcotic agent status: Secondary | ICD-10-CM | POA: Diagnosis not present

## 2023-05-29 DIAGNOSIS — J181 Lobar pneumonia, unspecified organism: Secondary | ICD-10-CM | POA: Diagnosis not present

## 2023-05-29 DIAGNOSIS — R0602 Shortness of breath: Secondary | ICD-10-CM | POA: Diagnosis not present

## 2023-05-29 DIAGNOSIS — J9811 Atelectasis: Secondary | ICD-10-CM | POA: Diagnosis not present

## 2023-05-29 DIAGNOSIS — Z720 Tobacco use: Secondary | ICD-10-CM | POA: Diagnosis present

## 2023-05-29 DIAGNOSIS — J449 Chronic obstructive pulmonary disease, unspecified: Secondary | ICD-10-CM | POA: Diagnosis not present

## 2023-05-29 DIAGNOSIS — J439 Emphysema, unspecified: Secondary | ICD-10-CM | POA: Diagnosis not present

## 2023-05-29 DIAGNOSIS — Z801 Family history of malignant neoplasm of trachea, bronchus and lung: Secondary | ICD-10-CM | POA: Diagnosis not present

## 2023-05-29 DIAGNOSIS — J189 Pneumonia, unspecified organism: Principal | ICD-10-CM | POA: Diagnosis present

## 2023-05-29 DIAGNOSIS — K8020 Calculus of gallbladder without cholecystitis without obstruction: Secondary | ICD-10-CM | POA: Diagnosis not present

## 2023-05-29 DIAGNOSIS — Z7984 Long term (current) use of oral hypoglycemic drugs: Secondary | ICD-10-CM

## 2023-05-29 DIAGNOSIS — Z6832 Body mass index (BMI) 32.0-32.9, adult: Secondary | ICD-10-CM | POA: Diagnosis not present

## 2023-05-29 DIAGNOSIS — Z79899 Other long term (current) drug therapy: Secondary | ICD-10-CM

## 2023-05-29 DIAGNOSIS — F17210 Nicotine dependence, cigarettes, uncomplicated: Secondary | ICD-10-CM | POA: Diagnosis present

## 2023-05-29 DIAGNOSIS — I959 Hypotension, unspecified: Secondary | ICD-10-CM | POA: Diagnosis not present

## 2023-05-29 DIAGNOSIS — E1165 Type 2 diabetes mellitus with hyperglycemia: Secondary | ICD-10-CM | POA: Diagnosis not present

## 2023-05-29 DIAGNOSIS — E1159 Type 2 diabetes mellitus with other circulatory complications: Secondary | ICD-10-CM | POA: Diagnosis present

## 2023-05-29 DIAGNOSIS — E872 Acidosis, unspecified: Secondary | ICD-10-CM | POA: Diagnosis present

## 2023-05-29 DIAGNOSIS — Z96641 Presence of right artificial hip joint: Secondary | ICD-10-CM | POA: Diagnosis not present

## 2023-05-29 DIAGNOSIS — R918 Other nonspecific abnormal finding of lung field: Secondary | ICD-10-CM | POA: Diagnosis not present

## 2023-05-29 LAB — CBC WITH DIFFERENTIAL/PLATELET
Abs Immature Granulocytes: 0.27 10*3/uL — ABNORMAL HIGH (ref 0.00–0.07)
Basophils Absolute: 0 10*3/uL (ref 0.0–0.1)
Basophils Relative: 0 %
Eosinophils Absolute: 0.1 10*3/uL (ref 0.0–0.5)
Eosinophils Relative: 1 %
HCT: 42.8 % (ref 39.0–52.0)
Hemoglobin: 13.5 g/dL (ref 13.0–17.0)
Immature Granulocytes: 2 %
Lymphocytes Relative: 14 %
Lymphs Abs: 1.7 10*3/uL (ref 0.7–4.0)
MCH: 29.5 pg (ref 26.0–34.0)
MCHC: 31.5 g/dL (ref 30.0–36.0)
MCV: 93.7 fL (ref 80.0–100.0)
Monocytes Absolute: 0.8 10*3/uL (ref 0.1–1.0)
Monocytes Relative: 6 %
Neutro Abs: 9.7 10*3/uL — ABNORMAL HIGH (ref 1.7–7.7)
Neutrophils Relative %: 77 %
Platelets: 463 10*3/uL — ABNORMAL HIGH (ref 150–400)
RBC: 4.57 MIL/uL (ref 4.22–5.81)
RDW: 13.9 % (ref 11.5–15.5)
WBC: 12.6 10*3/uL — ABNORMAL HIGH (ref 4.0–10.5)
nRBC: 0 % (ref 0.0–0.2)

## 2023-05-29 LAB — COMPREHENSIVE METABOLIC PANEL
ALT: 21 U/L (ref 0–44)
AST: 20 U/L (ref 15–41)
Albumin: 3.9 g/dL (ref 3.5–5.0)
Alkaline Phosphatase: 44 U/L (ref 38–126)
Anion gap: 10 (ref 5–15)
BUN: 27 mg/dL — ABNORMAL HIGH (ref 8–23)
CO2: 22 mmol/L (ref 22–32)
Calcium: 9.6 mg/dL (ref 8.9–10.3)
Chloride: 103 mmol/L (ref 98–111)
Creatinine, Ser: 0.79 mg/dL (ref 0.61–1.24)
GFR, Estimated: >60 mL/min (ref >60)
Glucose, Bld: 115 mg/dL — ABNORMAL HIGH (ref 70–99)
Potassium: 4.2 mmol/L (ref 3.5–5.1)
Sodium: 135 mmol/L (ref 135–145)
Total Bilirubin: 0.6 mg/dL (ref 0.0–1.2)
Total Protein: 7.5 g/dL (ref 6.5–8.1)

## 2023-05-29 LAB — TROPONIN I (HIGH SENSITIVITY)
Troponin I (High Sensitivity): 11 ng/L (ref <18)
Troponin I (High Sensitivity): 11 ng/L (ref <18)

## 2023-05-29 LAB — BRAIN NATRIURETIC PEPTIDE: B Natriuretic Peptide: 66.1 pg/mL (ref 0.0–100.0)

## 2023-05-29 LAB — RESP PANEL BY RT-PCR (RSV, FLU A&B, COVID)  RVPGX2
Influenza A by PCR: NEGATIVE
Influenza B by PCR: NEGATIVE
Resp Syncytial Virus by PCR: NEGATIVE
SARS Coronavirus 2 by RT PCR: NEGATIVE

## 2023-05-29 LAB — CBG MONITORING, ED: Glucose-Capillary: 145 mg/dL — ABNORMAL HIGH (ref 70–99)

## 2023-05-29 LAB — LACTIC ACID, PLASMA: Lactic Acid, Venous: 2.1 mmol/L (ref 0.5–1.9)

## 2023-05-29 MED ORDER — ACETYLCYSTEINE 20 % IN SOLN
4.0000 mL | Freq: Two times a day (BID) | RESPIRATORY_TRACT | Status: DC
Start: 2023-05-30 — End: 2023-06-02
  Administered 2023-05-30 – 2023-06-02 (×7): 4 mL via RESPIRATORY_TRACT
  Filled 2023-05-29 (×8): qty 4

## 2023-05-29 MED ORDER — METHYLPREDNISOLONE SODIUM SUCC 125 MG IJ SOLR
80.0000 mg | Freq: Every day | INTRAMUSCULAR | Status: DC
  Administered 2023-05-29 – 2023-05-31 (×3): 80 mg via INTRAVENOUS
  Filled 2023-05-29 (×3): qty 2

## 2023-05-29 MED ORDER — ALBUTEROL SULFATE (2.5 MG/3ML) 0.083% IN NEBU: 2.5000 mg | INHALATION_SOLUTION | RESPIRATORY_TRACT | Status: DC | PRN

## 2023-05-29 MED ORDER — IPRATROPIUM-ALBUTEROL 0.5-2.5 (3) MG/3ML IN SOLN
3.0000 mL | Freq: Once | RESPIRATORY_TRACT | Status: AC
  Administered 2023-05-29: 3 mL via RESPIRATORY_TRACT
  Filled 2023-05-29: qty 3

## 2023-05-29 MED ORDER — DM-GUAIFENESIN ER 30-600 MG PO TB12
1.0000 | ORAL_TABLET | Freq: Two times a day (BID) | ORAL | Status: DC | PRN
Start: 2023-05-29 — End: 2023-06-02

## 2023-05-29 MED ORDER — SODIUM CHLORIDE 0.9 % IV SOLN
2.0000 g | Freq: Once | INTRAVENOUS | Status: AC
  Administered 2023-05-29: 2 g via INTRAVENOUS
  Filled 2023-05-29: qty 12.5

## 2023-05-29 MED ORDER — ACETAMINOPHEN 325 MG PO TABS
650.0000 mg | ORAL_TABLET | Freq: Four times a day (QID) | ORAL | Status: DC | PRN
  Administered 2023-05-30 – 2023-05-31 (×3): 650 mg via ORAL
  Filled 2023-05-29 (×3): qty 2

## 2023-05-29 MED ORDER — INSULIN ASPART 100 UNIT/ML IJ SOLN
0.0000 [IU] | Freq: Three times a day (TID) | INTRAMUSCULAR | Status: DC
  Administered 2023-05-30: 2 [IU] via SUBCUTANEOUS
  Administered 2023-05-30 (×2): 3 [IU] via SUBCUTANEOUS
  Administered 2023-05-31: 2 [IU] via SUBCUTANEOUS
  Administered 2023-05-31: 3 [IU] via SUBCUTANEOUS
  Administered 2023-05-31: 2 [IU] via SUBCUTANEOUS
  Administered 2023-06-01: 1 [IU] via SUBCUTANEOUS
  Administered 2023-06-01 (×2): 2 [IU] via SUBCUTANEOUS
  Administered 2023-06-02: 3 [IU] via SUBCUTANEOUS
  Administered 2023-06-02: 1 [IU] via SUBCUTANEOUS
  Filled 2023-05-29 (×11): qty 1

## 2023-05-29 MED ORDER — OXYCODONE HCL 5 MG PO TABS
5.0000 mg | ORAL_TABLET | ORAL | Status: DC | PRN
  Administered 2023-05-29 – 2023-06-02 (×15): 10 mg via ORAL
  Filled 2023-05-29 (×16): qty 2

## 2023-05-29 MED ORDER — IOHEXOL 350 MG/ML SOLN
75.0000 mL | Freq: Once | INTRAVENOUS | Status: AC | PRN
  Administered 2023-05-29: 75 mL via INTRAVENOUS

## 2023-05-29 MED ORDER — LACTATED RINGERS IV BOLUS
1000.0000 mL | Freq: Once | INTRAVENOUS | Status: AC
  Administered 2023-05-29: 1000 mL via INTRAVENOUS

## 2023-05-29 MED ORDER — VANCOMYCIN HCL IN DEXTROSE 1-5 GM/200ML-% IV SOLN
1000.0000 mg | Freq: Once | INTRAVENOUS | Status: AC
  Administered 2023-05-29: 1000 mg via INTRAVENOUS
  Filled 2023-05-29: qty 200

## 2023-05-29 MED ORDER — LORATADINE 10 MG PO TABS
10.0000 mg | ORAL_TABLET | Freq: Every day | ORAL | Status: DC
  Administered 2023-05-30 – 2023-06-02 (×4): 10 mg via ORAL
  Filled 2023-05-29 (×5): qty 1

## 2023-05-29 MED ORDER — INSULIN ASPART 100 UNIT/ML IJ SOLN
0.0000 [IU] | Freq: Every day | INTRAMUSCULAR | Status: DC
  Administered 2023-05-30: 2 [IU] via SUBCUTANEOUS
  Filled 2023-05-29: qty 1

## 2023-05-29 MED ORDER — HYDRALAZINE HCL 20 MG/ML IJ SOLN: 5.0000 mg | INTRAMUSCULAR | Status: DC | PRN

## 2023-05-29 MED ORDER — IPRATROPIUM-ALBUTEROL 0.5-2.5 (3) MG/3ML IN SOLN
3.0000 mL | RESPIRATORY_TRACT | Status: DC
  Administered 2023-05-30 (×3): 3 mL via RESPIRATORY_TRACT
  Filled 2023-05-29 (×3): qty 3

## 2023-05-29 MED ORDER — ONDANSETRON HCL 4 MG/2ML IJ SOLN: 4.0000 mg | Freq: Three times a day (TID) | INTRAMUSCULAR | Status: DC | PRN

## 2023-05-29 MED ORDER — APIXABAN 2.5 MG PO TABS
2.5000 mg | ORAL_TABLET | Freq: Two times a day (BID) | ORAL | Status: DC
  Administered 2023-05-30 – 2023-06-02 (×7): 2.5 mg via ORAL
  Filled 2023-05-29 (×8): qty 1

## 2023-05-29 MED ORDER — AMLODIPINE BESYLATE 10 MG PO TABS
10.0000 mg | ORAL_TABLET | Freq: Every day | ORAL | Status: DC
  Administered 2023-05-30: 10 mg via ORAL
  Filled 2023-05-29: qty 2

## 2023-05-29 MED ORDER — ACETYLCYSTEINE 20 % IN SOLN
4.0000 mL | Freq: Once | RESPIRATORY_TRACT | Status: AC
  Administered 2023-05-29: 4 mL via RESPIRATORY_TRACT
  Filled 2023-05-29: qty 4

## 2023-05-29 MED ORDER — NICOTINE 21 MG/24HR TD PT24
21.0000 mg | MEDICATED_PATCH | Freq: Every day | TRANSDERMAL | Status: DC
  Administered 2023-05-30 – 2023-06-02 (×4): 21 mg via TRANSDERMAL
  Filled 2023-05-29 (×5): qty 1

## 2023-05-29 MED ORDER — GABAPENTIN 300 MG PO CAPS
600.0000 mg | ORAL_CAPSULE | Freq: Three times a day (TID) | ORAL | Status: DC
  Administered 2023-05-29 – 2023-06-02 (×11): 600 mg via ORAL
  Filled 2023-05-29 (×11): qty 2

## 2023-05-29 MED ORDER — MORPHINE SULFATE (PF) 4 MG/ML IV SOLN
4.0000 mg | Freq: Once | INTRAVENOUS | Status: AC
  Administered 2023-05-29: 4 mg via INTRAVENOUS
  Filled 2023-05-29: qty 1

## 2023-05-29 MED ORDER — PRAVASTATIN SODIUM 40 MG PO TABS
40.0000 mg | ORAL_TABLET | Freq: Every day | ORAL | Status: DC
Start: 2023-05-30 — End: 2023-06-02
  Administered 2023-05-30 – 2023-06-02 (×4): 40 mg via ORAL
  Filled 2023-05-29: qty 2
  Filled 2023-05-29: qty 1
  Filled 2023-05-29 (×3): qty 2

## 2023-05-29 NOTE — ED Notes (Signed)
 Ultrasound at bedside

## 2023-05-29 NOTE — ED Notes (Signed)
 MD at bedside.

## 2023-05-29 NOTE — ED Notes (Signed)
Pt given food and beverage 

## 2023-05-29 NOTE — ED Provider Notes (Signed)
 [facility] Provider Note    Event Date/Time   First MD Initiated Contact with Patient 05/29/23 1736     (approximate)   History   Shortness of breath   HPI  Jimmy Bishop is a 63 y.o. male who presents to the emergency department today with shortness of breath.  States that he has felt short of breath for roughly 2 weeks.  He has a feeling of significant congestion in his chest that he cannot get up.  Patient does have history of COPD and has been trying his home breathing treatments without any significant relief.  This has been accompanied by some chest discomfort.  He states that 5 days ago he underwent right hip surgery and since then has noticed some swelling to his right leg and pain around the hip.  Patient denies any fevers.     Physical Exam   Triage Vital Signs: ED Triage Vitals  Encounter Vitals Group     BP 05/29/23 1748 (!) 120/59     Systolic BP Percentile --      Diastolic BP Percentile --      Pulse Rate 05/29/23 1743 84     Resp 05/29/23 1743 14     Temp 05/29/23 1743 97.7 F (36.5 C)     Temp Source 05/29/23 1743 Oral     SpO2 05/29/23 1743 100 %     Weight --      Height --      Head Circumference --      Peak Flow --      Pain Score 05/29/23 1743 8     Pain Loc --      Pain Education --      Exclude from Growth Chart --     Most recent vital signs: Vitals:   05/29/23 1748 05/29/23 1749  BP: (!) 120/59   Pulse: 86 85  Resp: 20 16  Temp:    SpO2: 94% 94%   General: Awake, alert, oriented. CV:  Good peripheral perfusion. Regular rate and rhythm. Resp:  Slightly increased work of breathing. Frequent cough. Abd:  No distention.    ED Results / Procedures / Treatments   Labs (all labs ordered are listed, but only abnormal results are displayed) Labs Reviewed  CBC WITH DIFFERENTIAL/PLATELET - Abnormal; Notable for the following components:      Result Value   WBC 12.6 (*)    Platelets 463 (*)    Neutro Abs  9.7 (*)    Abs Immature Granulocytes 0.27 (*)    All other components within normal limits  COMPREHENSIVE METABOLIC PANEL - Abnormal; Notable for the following components:   Glucose, Bld 115 (*)    BUN 27 (*)    All other components within normal limits  LACTIC ACID, PLASMA - Abnormal; Notable for the following components:   Lactic Acid, Venous 2.1 (*)    All other components within normal limits  CBG MONITORING, ED - Abnormal; Notable for the following components:   Glucose-Capillary 145 (*)    All other components within normal limits  RESP PANEL BY RT-PCR (RSV, FLU A&B, COVID)  [id number]  CULTURE, BLOOD (ROUTINE X 2)  CULTURE, BLOOD (ROUTINE X 2)  EXPECTORATED SPUTUM ASSESSMENT W GRAM STAIN, RFLX TO RESP C  LACTIC ACID, PLASMA  LEGIONELLA PNEUMOPHILA SEROGP 1 UR AG  STREP PNEUMONIAE URINARY ANTIGEN  BASIC METABOLIC PANEL  CBC  BRAIN NATRIURETIC PEPTIDE  TROPONIN I (HIGH SENSITIVITY)  TROPONIN I (HIGH SENSITIVITY)  EKG  I, Phineas Semen, attending physician, personally viewed and interpreted this EKG  EKG Time: 1744 Rate: 86 Rhythm: sinus rhythm Axis: normal Intervals: qtc 435 QRS: narrow ST changes: no st elevation Impression: normal ekg   RADIOLOGY I independently interpreted and visualized the CXR. My interpretation: Right upper lobe pneumonia Radiology interpretation:  IMPRESSION:  Focal consolidation right upper lung concerning for pneumonia.  Recommend follow-up chest radiograph in 6-8 weeks to ensure  resolution.      PROCEDURES:  Critical Care performed: No   MEDICATIONS ORDERED IN ED: Medications - No data to display   IMPRESSION / MDM / ASSESSMENT AND PLAN / ED COURSE  I reviewed the triage vital signs and the nursing notes.                              Differential diagnosis includes, but is not limited to, pneumonia, PE, viral illness  Patient's presentation is most consistent with acute presentation with potential threat to life or  bodily function.   The patient is on the cardiac monitor to evaluate for evidence of arrhythmia and/or significant heart rate changes.  Patient presented to the emergency department today because of concerns for shortness of breath.  On exam patient has slightly increased work of breathing.  Lungs are clear.  Chest x-ray is concerning for pneumonia.  However given recent surgery and concern for some leg swelling will obtain a CT angio to evaluate for PE, although I have lower concern since patient's breathing issues did start prior to the surgery.  Will give Mucomyst to see if that helps with the patient's congestion.  Will give IV antibiotics for the pneumonia.  CT without PE. Does again demonstrate pneumonia. Discussed with Dr. Clyde Lundborg with the hospitalist service who will evaluate for admission.      FINAL CLINICAL IMPRESSION(S) / ED DIAGNOSES   Final diagnoses:  Pneumonia due to infectious organism, unspecified laterality, unspecified part of lung      Note:  This document was prepared using Dragon voice recognition software and may include unintentional dictation errors.    Phineas Semen, MD 05/29/23 2249

## 2023-05-29 NOTE — H&P (Addendum)
 History and Physical    Jimmy Bishop TKZ:[id number] DOB: [birth year] DOA: 05/29/2023  Referring MD/NP/PA:   PCP: Sherlyn Hay, DO   Patient coming from:  The patient is coming from home.     Chief Complaint: SOB  HPI: Jimmy Bishop is a 63 y.o. male with medical history significant of HTN, HLD, DM, COPD, tobacco abuse, anemia, s/p of recent right hip replacement on 3/5 (on Eliquis), who presents with SOB.  Patient states that he has shortness of breath for almost 2 weeks, which has worsened today.  He has cough with difficulty coughing up any mucus.  No chest pain, fever or chills.  No nausea, vomiting, diarrhea or abdominal pain.  No symptoms of UTI.  Patient has bilateral leg edema, right leg slightly worse than the left.  Denies history of CHF.  Patient had right hip replacement on 3/5, still has some pain in the right hip, with little fluid oozing out from surgical site.   Data reviewed independently and ED Course: pt was found to have WBC 12.6, lactic acid 2.1, negative PCR for COVID, flu and RSV, GFR> 60, troponin 11 --> 11.  Temperature normal, blood pressure 107/60, heart rate 88, RR 24, oxygen saturation 96% on room air.  Chest x-ray with right upper lobe infiltration.  CT negative for PE.  Patient is admitted to telemetry bed as inpatient.  CTA: 1. No evidence of arterial embolus or acute right heart strain. 2. Right upper lobe and superior segment right lower lobe pneumonia. 3. Emphysema. 4. Bronchitis with scattered subsegmental small airway plugging in the lower lobes. 5. Cardiomegaly with aortic and coronary artery atherosclerosis. 6. Prominent pulmonary trunk at 3.3 cm consistent with arterial hypertension. 7. Mild hepatic steatosis with subtle capsular nodularity in the left lobe suspicious for early cirrhosis. No splenomegaly or upper abdominal ascites. 8. Cholelithiasis. 9. Chronic appearing wedging of the T7 and T10-11 vertebral bodies, interbody  ankylosis T10-11.   Aortic Atherosclerosis (ICD10-I70.0) and Emphysema (ICD10-J43.9).   EKG: I have personally reviewed.  Sinus rhythm, QTc 435, poor R wave progression, and stable baseline, low voltage.   Review of Systems:   General: no fevers, chills, no body weight gain, has fatigue HEENT: no blurry vision, hearing changes or sore throat Respiratory: has dyspnea, coughing, no wheezing CV: no chest pain, no palpitations GI: no nausea, vomiting, abdominal pain, diarrhea, constipation GU: no dysuria, burning on urination, increased urinary frequency, hematuria  Ext: has leg edema Neuro: no unilateral weakness, numbness, or tingling, no vision change or hearing loss Skin: no rash, no skin tear. MSK: No muscle spasm, no deformity, no limitation of range of movement in spin. Has right hip pain Heme: No easy bruising.  Travel history: No recent long distant travel.   Allergy:  Allergies  Allergen Reactions   Vicodin [Hydrocodone-Acetaminophen] Hives and Rash    Past Medical History:  Diagnosis Date   Allergy    Arthritis    left knee   COPD (chronic obstructive pulmonary disease) (HCC)    Diabetes mellitus without complication (HCC)    Hypertension    Primary osteoarthritis of right hip     Past Surgical History:  Procedure Laterality Date   ANKLE SURGERY     ARM WOUND REPAIR / CLOSURE     Infected from dog bite   COLONOSCOPY WITH PROPOFOL N/A 07/20/2017   Procedure: COLONOSCOPY WITH PROPOFOL;  Surgeon: Toledo, Boykin Nearing, MD;  Location: [facility] ENDOSCOPY;  Service: Gastroenterology;  Laterality: N/A;  IRRIGATION AND DEBRIDEMENT ABSCESS Right 09/09/2020   Procedure: IRRIGATION AND DEBRIDEMENT ABSCESS-RIGHT FOREARM;  Surgeon: Carolan Shiver, MD;  Location: [facility] ORS;  Service: General;  Laterality: Right;   NECK SURGERY     POSTERIOR CERVICAL FUSION/FORAMINOTOMY N/A 04/18/2015   Procedure: Posterior cervical fusion with lateral mass fixation - Cervical three-Thoracic  one, cervical laminectomy Cervical three-Cervical seven;  Surgeon: Tia Alert, MD;  Location: [facility];  Service: Neurosurgery;  Laterality: N/A;   SKIN GRAFT     TOTAL HIP ARTHROPLASTY Right 05/24/2023   Procedure: ARTHROPLASTY, HIP, TOTAL,POSTERIOR APPROACH;  Surgeon: Christena Flake, MD;  Location: [facility] ORS;  Service: Orthopedics;  Laterality: Right;    Social History:  reports that he has been smoking cigarettes. He has a 40 pack-year smoking history. He has never used smokeless tobacco. He reports current alcohol use of about 6.0 standard drinks of alcohol per week. He reports current drug use. Drug: Marijuana.  Family History:  Family History  Problem Relation Age of Onset   Lung cancer Mother    CAD Mother    Cancer Father    Lung cancer Sister      Prior to Admission medications   Medication Sig Start Date End Date Taking? Authorizing Provider  acetaminophen (TYLENOL) 500 MG tablet Take 2 tablets (1,000 mg total) by mouth every 6 (six) hours. 05/25/23   Anson Oregon, PA-C  amLODipine (NORVASC) 10 MG tablet Take 10 mg by mouth daily. 08/27/20   [provider]  apixaban (ELIQUIS) 2.5 MG TABS tablet Take 1 tablet (2.5 mg total) by mouth 2 (two) times daily. 05/25/23   Anson Oregon, PA-C  Blood Glucose Monitoring Suppl DEVI 1 each by Does not apply route daily before breakfast. May substitute to any manufacturer covered by patient's insurance. 03/14/23   Sherlyn Hay, DO  cetirizine (ZYRTEC) 10 MG tablet Take 1 tablet (10 mg total) by mouth daily. 03/14/23   Sherlyn Hay, DO  gabapentin (NEURONTIN) 600 MG tablet Take 1 tablet (600 mg total) by mouth 3 (three) times daily. 03/14/23   Sherlyn Hay, DO  Glucose Blood (BLOOD GLUCOSE TEST STRIPS) STRP 1 each by In Vitro route daily before breakfast. May substitute to any manufacturer covered by patient's insurance. 03/14/23   Sherlyn Hay, DO  Lancet Device MISC 1 each by Does not apply route daily before  breakfast. May substitute to any manufacturer covered by patient's insurance. 03/14/23   Sherlyn Hay, DO  Lancets Misc. MISC 1 each by Does not apply route daily before breakfast. May substitute to any manufacturer covered by patient's insurance. 03/14/23   Sherlyn Hay, DO  lisinopril (ZESTRIL) 20 MG tablet Take 20 mg by mouth daily. 08/11/20   [provider]  metFORMIN (GLUCOPHAGE) 500 MG tablet Take 1 tablet (500 mg total) by mouth 2 (two) times daily with a meal. 03/14/23   Pardue, Monico Blitz, DO  ondansetron (ZOFRAN) 4 MG tablet Take 1 tablet (4 mg total) by mouth every 6 (six) hours as needed for nausea. 05/25/23   Anson Oregon, PA-C  oxyCODONE (OXY IR/ROXICODONE) 5 MG immediate release tablet Take 1-2 tablets (5-10 mg total) by mouth every 4 (four) hours as needed for moderate pain (pain score 4-6). 05/25/23   Anson Oregon, PA-C  pravastatin (PRAVACHOL) 40 MG tablet Take 40 mg by mouth daily.    [provider]  STIOLTO RESPIMAT 2.5-2.5 MCG/ACT AERS Inhale 2 puffs into the lungs daily. 03/14/23  Sherlyn Hay, DO    Physical Exam: Vitals:   05/29/23 2205 05/29/23 2210 05/29/23 2215 05/29/23 2230  BP:    137/60  Pulse: 77 76 82 78  Resp: 11 12 13 19   Temp:      TempSrc:      SpO2: 95% (!) 88% 98% 97%   General: Not in acute distress HEENT:       Eyes: PERRL, EOMI, no jaundice       ENT: No discharge from the ears and nose, no pharynx injection, no tonsillar enlargement.        Neck: No JVD, no bruit, no mass felt. Heme: No neck lymph node enlargement. Cardiac: S1/S2, RRR, No murmurs, No gallops or rubs. Respiratory: has rhonchi bilaterally GI: Soft, nondistended, nontender, no rebound pain, no organomegaly, BS present. GU: No hematuria Ext: has 1+ pitting leg edema bilaterally. 1+DP/PT pulse bilaterally. Musculoskeletal: s/p of right hip replacement, has tenderness to right hip.  Has little fluid oozing out from surgical site, no surrounding  erythema or warmth.     Skin: No rashes.  Neuro: Alert, oriented X3, cranial nerves II-XII grossly intact, moves all extremities normally.  Psych: Patient is not psychotic, no suicidal or hemocidal ideation.  Labs on Admission: I have personally reviewed following labs and imaging studies  CBC: Recent Labs  Lab 05/25/23 0359 05/29/23 1802  WBC 15.1* 12.6*  NEUTROABS  --  9.7*  HGB 12.4* 13.5  HCT 38.3* 42.8  MCV 93.0 93.7  PLT 343 463*   Basic Metabolic Panel: Recent Labs  Lab 05/25/23 0359 05/29/23 1802  NA 134* 135  K 5.0 4.2  CL 104 103  CO2 23 22  GLUCOSE 199* 115*  BUN 27* 27*  CREATININE 1.08 0.79  CALCIUM 8.9 9.6   GFR: Estimated Creatinine Clearance: 110.3 mL/min (by C-G formula based on SCr of 0.79 mg/dL). Liver Function Tests: Recent Labs  Lab 05/29/23 1802  AST 20  ALT 21  ALKPHOS 44  BILITOT 0.6  PROT 7.5  ALBUMIN 3.9   No results for input(s): "LIPASE", "AMYLASE" in the last 168 hours. No results for input(s): "AMMONIA" in the last 168 hours. Coagulation Profile: No results for input(s): "INR", "PROTIME" in the last 168 hours. Cardiac Enzymes: No results for input(s): "CKTOTAL", "CKMB", "CKMBINDEX", "TROPONINI" in the last 168 hours. BNP (last 3 results) No results for input(s): "PROBNP" in the last 8760 hours. HbA1C: No results for input(s): "HGBA1C" in the last 72 hours. CBG: Recent Labs  Lab 05/24/23 1656 05/24/23 2201 05/25/23 0823 05/25/23 1206 05/29/23 2234  GLUCAP 287* 156* 202* 170* 145*   Lipid Profile: No results for input(s): "CHOL", "HDL", "LDLCALC", "TRIG", "CHOLHDL", "LDLDIRECT" in the last 72 hours. Thyroid Function Tests: No results for input(s): "TSH", "T4TOTAL", "FREET4", "T3FREE", "THYROIDAB" in the last 72 hours. Anemia Panel: No results for input(s): "VITAMINB12", "FOLATE", "FERRITIN", "TIBC", "IRON", "RETICCTPCT" in the last 72 hours. Urine analysis:    Component Value Date/Time   COLORURINE YELLOW (A)  05/16/2023 1209   APPEARANCEUR CLEAR (A) 05/16/2023 1209   LABSPEC 1.018 05/16/2023 1209   PHURINE 6.0 05/16/2023 1209   GLUCOSEU NEGATIVE 05/16/2023 1209   HGBUR NEGATIVE 05/16/2023 1209   BILIRUBINUR NEGATIVE 05/16/2023 1209   KETONESUR NEGATIVE 05/16/2023 1209   PROTEINUR NEGATIVE 05/16/2023 1209   NITRITE NEGATIVE 05/16/2023 1209   LEUKOCYTESUR NEGATIVE 05/16/2023 1209   Sepsis Labs: @LABRCNTIP (procalcitonin:4,lacticidven:4) ) Recent Results (from the past 240 hours)  Resp panel by RT-PCR (RSV, Flu A&B, Covid)  Anterior Nasal Swab     Status: None   Collection Time: 05/29/23  5:34 PM   Specimen: Anterior Nasal Swab  Result Value Ref Range Status   SARS Coronavirus 2 by RT PCR NEGATIVE NEGATIVE Final    Comment: (NOTE) SARS-CoV-2 target nucleic acids are NOT DETECTED.  The SARS-CoV-2 RNA is generally detectable in upper respiratory specimens during the acute phase of infection. The lowest concentration of SARS-CoV-2 viral copies this assay can detect is 138 copies/mL. A negative result does not preclude SARS-Cov-2 infection and should not be used as the sole basis for treatment or other patient management decisions. A negative result may occur with  improper specimen collection/handling, submission of specimen other than nasopharyngeal swab, presence of viral mutation(s) within the areas targeted by this assay, and inadequate number of viral copies(<138 copies/mL). A negative result must be combined with clinical observations, patient history, and epidemiological information. The expected result is Negative.  Fact Sheet for Patients:  BloggerCourse.com  Fact Sheet for Healthcare Providers:  SeriousBroker.it  This test is no t yet approved or cleared by the [place] FDA and  has been authorized for detection and/or diagnosis of SARS-CoV-2 by FDA under an Emergency Use Authorization (EUA). This EUA will remain  in  effect (meaning this test can be used) for the duration of the COVID-19 declaration under Section 564(b)(1) of the Act, 21 U.S.C.section 360bbb-3(b)(1), unless the authorization is terminated  or revoked sooner.       Influenza A by PCR NEGATIVE NEGATIVE Final   Influenza B by PCR NEGATIVE NEGATIVE Final    Comment: (NOTE) The Xpert Xpress SARS-CoV-2/FLU/RSV plus assay is intended as an aid in the diagnosis of influenza from Nasopharyngeal swab specimens and should not be used as a sole basis for treatment. Nasal washings and aspirates are unacceptable for Xpert Xpress SARS-CoV-2/FLU/RSV testing.  Fact Sheet for Patients: BloggerCourse.com  Fact Sheet for Healthcare Providers: SeriousBroker.it  This test is not yet approved or cleared by the [place] FDA and has been authorized for detection and/or diagnosis of SARS-CoV-2 by FDA under an Emergency Use Authorization (EUA). This EUA will remain in effect (meaning this test can be used) for the duration of the COVID-19 declaration under Section 564(b)(1) of the Act, 21 U.S.C. section 360bbb-3(b)(1), unless the authorization is terminated or revoked.     Resp Syncytial Virus by PCR NEGATIVE NEGATIVE Final    Comment: (NOTE) Fact Sheet for Patients: BloggerCourse.com  Fact Sheet for Healthcare Providers: SeriousBroker.it  This test is not yet approved or cleared by the [place] FDA and has been authorized for detection and/or diagnosis of SARS-CoV-2 by FDA under an Emergency Use Authorization (EUA). This EUA will remain in effect (meaning this test can be used) for the duration of the COVID-19 declaration under Section 564(b)(1) of the Act, 21 U.S.C. section 360bbb-3(b)(1), unless the authorization is terminated or revoked.  Performed at [facility], [address], [place], [place] [address]       Radiological Exams on Admission:   Assessment/Plan Principal Problem:   HCAP (healthcare-associated pneumonia) Active Problems:   COPD (chronic obstructive pulmonary disease) (HCC)   Hypertension   Lower extremity edema   Diabetes mellitus without complication (HCC)   HLD (hyperlipidemia)   Status post total hip replacement, right   Tobacco abuse   Obesity (BMI 30-39.9)   Assessment and Plan:  HCAP (healthcare-associated pneumonia): Patient has mild leukocytosis with WBC 12.6, elevated lactic acid of 2.1, but no fever, clinically  does not seem to have sepsis.  No oxygen desaturation.  - Will admit to tele bed as inpt - IV Vancomycin and cefepime - Incentive spirometry - Solu-Medrol 80 mg daily - Mucinex for cough  - Bronchodilators - Mucomyst nebulizer due to mucous plug - Urine legionella and S. pneumococcal antigen - Follow up blood culture x2, sputum culture - trend lactic acid level - IVF: 1L of LR bolus in ED, will not give more IVF due to bilateral leg edema.  COPD (chronic obstructive pulmonary disease) (HCC) -Bronchodilators and as needed Mucinex as above  Hypertension: Blood pressure 107/60 -Amlodipine - IV hydralazine as needed -Hold lisinopril due to soft blood pressure  Lower extremity edema: No history of CHF.  Recent 2D echo on 12//24 showed EF 60 to 65%. -Follow-up lower extremity venous Doppler to rule out a DVT -Check BNP to rule out CHF  Diabetes mellitus without complication [facility]): Recent A1c 5.9, well-controlled.  Patient taking metformin -Sliding scale insulin  Status post total hip replacement, right: has tenderness to hip.  Has little fluid oozing out from surgical site, no surrounding erythema or warmth, dose not seem to be infected. -As needed Tylenol, oxycodone for pain -Fall precaution -PT/OT -Continue home Eliquis 2.5 mg twice daily -wound care consult  HLD -Pravastatin  Tobacco abuse -Nicotine patch  Obesity (BMI 30-39.9):  Body weight 100.2 kg, BMI 32.64 -Encourage losing weight -Exercise healthy diet        DVT ppx: on  Eliquis  Code Status: Full code   Family Communication:     not done, no family member is at bed side.     Disposition Plan:  Anticipate discharge back to previous environment  Consults called:  none  Admission status and Level of care: Telemetry Medical:   as inpt        Dispo: The patient is from: Home              Anticipated d/c is to: Home              Anticipated d/c date is: 2 days              Patient currently is not medically stable to d/c.    Severity of Illness:  The appropriate patient status for this patient is INPATIENT. Inpatient status is judged to be reasonable and necessary in order to provide the required intensity of service to ensure the patient's safety. The patient's presenting symptoms, physical exam findings, and initial radiographic and laboratory data in the context of their chronic comorbidities is felt to place them at high risk for further clinical deterioration. Furthermore, it is not anticipated that the patient will be medically stable for discharge from the hospital within 2 midnights of admission.   * I certify that at the point of admission it is my clinical judgment that the patient will require inpatient hospital care spanning beyond 2 midnights from the point of admission due to high intensity of service, high risk for further deterioration and high frequency of surveillance required.*       Date of Service 05/29/2023    Lorretta Harp [facility]   If 7PM-7AM, please contact night-coverage [email or web address] 05/29/2023, 11:03 PM

## 2023-05-29 NOTE — ED Notes (Addendum)
 This RN meets pt for first time. As pt is transitioning to bed, yellow fluid is noted to be leaking from right hip. Pt states it just started today. Provider is notified at this time.

## 2023-05-29 NOTE — ED Triage Notes (Signed)
 Pt arrives via ems from home with c/o CP and SOB the last 2W but worsened today. Pt states he has developed a cough and edema noted to RLE. Hx of RT hip replacement on march 5. Hx of DVT. Pt on blood thinner. A&Ox4.

## 2023-05-29 NOTE — ED Notes (Signed)
 After speaking with Dr. Derrill Kay MD the pt has decided to stay and seek treatment. Dr. Derrill Kay MD verbal order to give pt water.

## 2023-05-29 NOTE — ED Notes (Signed)
 This RN gave report to [employer] and performed care handoff via phone.

## 2023-05-29 NOTE — ED Notes (Signed)
 CT at bedside at this time to transport pt to get CT imaging. Pt using explicit language describing how he wants to drink water. RN educated pt that he is not allowed a diet at this time per the MD. Pt used explicit profanity language towards RN that he "doesn't care" and states he does not want a CT scan or further medical treatment. Derrill Kay MD aware and en route to bedside. Pt on the phone requesting someone come and pick him up.    Writer educated on risk factors for leaving AMA. Pt states he understands and that "he doesn't care and something to drink is what I care about". Derrill Kay MD aware.

## 2023-05-30 ENCOUNTER — Encounter: Payer: Self-pay | Admitting: Internal Medicine

## 2023-05-30 DIAGNOSIS — E669 Obesity, unspecified: Secondary | ICD-10-CM | POA: Diagnosis not present

## 2023-05-30 DIAGNOSIS — E119 Type 2 diabetes mellitus without complications: Secondary | ICD-10-CM | POA: Diagnosis not present

## 2023-05-30 DIAGNOSIS — I10 Essential (primary) hypertension: Secondary | ICD-10-CM | POA: Diagnosis not present

## 2023-05-30 DIAGNOSIS — J189 Pneumonia, unspecified organism: Secondary | ICD-10-CM | POA: Diagnosis not present

## 2023-05-30 DIAGNOSIS — Z96641 Presence of right artificial hip joint: Secondary | ICD-10-CM | POA: Diagnosis not present

## 2023-05-30 DIAGNOSIS — Z720 Tobacco use: Secondary | ICD-10-CM | POA: Diagnosis not present

## 2023-05-30 DIAGNOSIS — J449 Chronic obstructive pulmonary disease, unspecified: Secondary | ICD-10-CM | POA: Diagnosis not present

## 2023-05-30 DIAGNOSIS — R600 Localized edema: Secondary | ICD-10-CM

## 2023-05-30 LAB — BLOOD CULTURE ID PANEL (REFLEXED) - BCID2

## 2023-05-30 LAB — CBG MONITORING, ED: Glucose-Capillary: 181 mg/dL — ABNORMAL HIGH (ref 70–99)

## 2023-05-30 LAB — BASIC METABOLIC PANEL
Anion gap: 9 (ref 5–15)
BUN: 28 mg/dL — ABNORMAL HIGH (ref 8–23)
CO2: 25 mmol/L (ref 22–32)
Calcium: 9.2 mg/dL (ref 8.9–10.3)
Chloride: 101 mmol/L (ref 98–111)
Creatinine, Ser: 0.71 mg/dL (ref 0.61–1.24)
GFR, Estimated: >60 mL/min (ref >60)
Glucose, Bld: 185 mg/dL — ABNORMAL HIGH (ref 70–99)
Potassium: 5.5 mmol/L — ABNORMAL HIGH (ref 3.5–5.1)
Sodium: 135 mmol/L (ref 135–145)

## 2023-05-30 LAB — GLUCOSE, CAPILLARY
Glucose-Capillary: 222 mg/dL — ABNORMAL HIGH (ref 70–99)
Glucose-Capillary: 226 mg/dL — ABNORMAL HIGH (ref 70–99)
Glucose-Capillary: 220 mg/dL — ABNORMAL HIGH (ref 70–99)

## 2023-05-30 LAB — CBC
HCT: 39.6 % (ref 39.0–52.0)
Hemoglobin: 12.5 g/dL — ABNORMAL LOW (ref 13.0–17.0)
MCH: 29.8 pg (ref 26.0–34.0)
MCHC: 31.6 g/dL (ref 30.0–36.0)
MCV: 94.3 fL (ref 80.0–100.0)
Platelets: 421 K/uL — ABNORMAL HIGH (ref 150–400)
RBC: 4.2 MIL/uL — ABNORMAL LOW (ref 4.22–5.81)
RDW: 13.9 % (ref 11.5–15.5)
WBC: 11.3 K/uL — ABNORMAL HIGH (ref 4.0–10.5)
nRBC: 0 % (ref 0.0–0.2)

## 2023-05-30 LAB — LACTIC ACID, PLASMA
Lactic Acid, Venous: 1.4 mmol/L (ref 0.5–1.9)
Lactic Acid, Venous: 1.7 mmol/L (ref 0.5–1.9)

## 2023-05-30 LAB — MRSA NEXT GEN BY PCR, NASAL: MRSA by PCR Next Gen: NOT DETECTED

## 2023-05-30 LAB — STREP PNEUMONIAE URINARY ANTIGEN: Strep Pneumo Urinary Antigen: NEGATIVE

## 2023-05-30 MED ORDER — VANCOMYCIN HCL 1500 MG/300ML IV SOLN
1500.0000 mg | Freq: Two times a day (BID) | INTRAVENOUS | Status: DC
  Administered 2023-05-30: 1500 mg via INTRAVENOUS
  Filled 2023-05-30 (×2): qty 300

## 2023-05-30 MED ORDER — SODIUM CHLORIDE 0.9 % IV SOLN
2.0000 g | Freq: Three times a day (TID) | INTRAVENOUS | Status: DC
  Administered 2023-05-30 – 2023-06-02 (×10): 2 g via INTRAVENOUS
  Filled 2023-05-30 (×13): qty 12.5

## 2023-05-30 MED ORDER — POLYVINYL ALCOHOL 1.4 % OP SOLN
1.0000 [drp] | OPHTHALMIC | Status: DC | PRN
  Administered 2023-05-30 – 2023-06-01 (×3): 1 [drp] via OPHTHALMIC
  Filled 2023-05-30: qty 15

## 2023-05-30 MED ORDER — IPRATROPIUM-ALBUTEROL 0.5-2.5 (3) MG/3ML IN SOLN
3.0000 mL | Freq: Two times a day (BID) | RESPIRATORY_TRACT | Status: DC
  Administered 2023-05-30 – 2023-06-02 (×6): 3 mL via RESPIRATORY_TRACT
  Filled 2023-05-30 (×7): qty 3

## 2023-05-30 MED ORDER — LISINOPRIL 20 MG PO TABS
20.0000 mg | ORAL_TABLET | Freq: Every day | ORAL | Status: DC
  Administered 2023-05-30 – 2023-06-02 (×4): 20 mg via ORAL
  Filled 2023-05-30 (×4): qty 1

## 2023-05-30 NOTE — Evaluation (Signed)
 Physical Therapy Evaluation Patient Details Name: Jimmy Bishop MRN: [id number] DOB: [birth year] Today's Date: 05/30/2023  History of Present Illness  Pt is a 63 y/o M admitted on 05/29/23 after presenting with c/o SOB x 2 weeks, cough. CTA showed RUL & RLL PNA. 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 Pt reports he was doing well s/p R THA, ambulating with RW, receiving HHPT & having assistance from family. On this date, pt is able to complete bed mobility with supervision with hospital bed features & extra time, STS with supervision, & short distance gait with RW & supervision. Pt noted to have R hip drainage onto bed sheets. Pt would benefit from ongoing PT services to address balance, strengthening, & gait.        If plan is discharge home, recommend the following: A little help with walking and/or transfers;A little help with bathing/dressing/bathroom;Assist for transportation;Help with stairs or ramp for entrance;Assistance with cooking/housework   Can travel by private vehicle        Equipment Recommendations None recommended by PT  Recommendations for Other Services       Functional Status Assessment Patient has had a recent decline in their functional status and demonstrates the ability to make significant improvements in function in a reasonable and predictable amount of time.     Precautions / Restrictions Precautions Precautions: Fall;Posterior Hip Restrictions Weight Bearing Restrictions Per Provider Order: Yes RLE Weight Bearing Per Provider Order: Weight bearing as tolerated      Mobility  Bed Mobility Overal bed mobility: Needs Assistance Bed Mobility: Supine to Sit, Sit to Supine     Supine to sit: Supervision, HOB elevated, Used rails (extra time) Sit to supine: Supervision, HOB elevated, Used rails        Transfers Overall transfer level: Needs  assistance Equipment used: Rolling walker (2 wheels) Transfers: Sit to/from Stand Sit to Stand: Supervision           General transfer comment: STS from EOB, cuing re: hand placement as pt with BUE on RW during STS vs pushing to standing/reaching back    Ambulation/Gait Ambulation/Gait assistance: Supervision Gait Distance (Feet): 30 Feet Assistive device: Rolling walker (2 wheels) Gait Pattern/deviations: Decreased step length - right, Decreased step length - left, Decreased stride length, Decreased dorsiflexion - right, Decreased weight shift to right Gait velocity: decreased        Stairs            Wheelchair Mobility     Tilt Bed    Modified Rankin (Stroke Patients Only)       Balance Overall balance assessment: Needs assistance Sitting-balance support: Feet supported Sitting balance-Leahy Scale: Good     Standing balance support: During functional activity, Bilateral upper extremity supported, Reliant on assistive device for balance Standing balance-Leahy Scale: Fair                               Pertinent Vitals/Pain Pain Assessment Pain Assessment: 0-10 Pain Score: 7  Pain Location: R hip Pain Descriptors / Indicators: Discomfort Pain Intervention(s): Monitored during session, Limited activity within patient's tolerance    Home Living Family/patient expects to be discharged to:: Private residence Living Arrangements: Parent;Other relatives Available Help at Discharge: Family;Available 24 hours/day Type of Home: House Home Access: Stairs to enter Entrance Stairs-Rails: Can reach both;Left;Right Entrance  Stairs-Number of Steps: 4-5 back of the house; 4 steps on front   Home Layout: One level Home Equipment: Grab bars - toilet;Cane - single point;Rolling Walker (2 wheels)      Prior Function               Mobility Comments: Prior to hip sx pt was ambulatory with SPC with minimal driving, since sx pt has been ambulatory  with RW.       Extremity/Trunk Assessment   Upper Extremity Assessment Upper Extremity Assessment: Overall WFL for tasks assessed    Lower Extremity Assessment Lower Extremity Assessment: RLE deficits/detail;Generalized weakness RLE Deficits / Details: 3/5 R knee extension in sitting       Communication   Communication Communication: No apparent difficulties    Cognition Arousal: Alert Behavior During Therapy: WFL for tasks assessed/performed   PT - Cognitive impairments: No apparent impairments                         Following commands: Intact       Cueing Cueing Techniques: Verbal cues     General Comments General comments (skin integrity, edema, etc.): Pt on 2L/min via nasal cannula, inconsistent pleth waveform during session but at end of session SpO2 >90%.    Exercises     Assessment/Plan    PT Assessment Patient needs continued PT services  PT Problem List Decreased strength;Cardiopulmonary status limiting activity;Decreased range of motion;Pain;Decreased activity tolerance;Decreased balance;Decreased mobility;Decreased knowledge of use of DME;Decreased safety awareness       PT Treatment Interventions DME instruction;Balance training;Modalities;Gait training;Stair training;Functional mobility training;Therapeutic activities;Therapeutic exercise;Patient/family education;Manual techniques;Neuromuscular re-education    PT Goals (Current goals can be found in the Care Plan section)  Acute Rehab PT Goals Patient Stated Goal: get better, return home PT Goal Formulation: With patient Time For Goal Achievement: 06/13/23 Potential to Achieve Goals: Good    Frequency Min 3X/week     Co-evaluation               AM-PAC PT "6 Clicks" Mobility  Outcome Measure Help needed turning from your back to your side while in a flat bed without using bedrails?: None Help needed moving from lying on your back to sitting on the side of a flat bed without  using bedrails?: A Little Help needed moving to and from a bed to a chair (including a wheelchair)?: A Little Help needed standing up from a chair using your arms (e.g., wheelchair or bedside chair)?: A Little Help needed to walk in hospital room?: A Little Help needed climbing 3-5 steps with a railing? : A Little 6 Click Score: 19    End of Session Equipment Utilized During Treatment: Oxygen Activity Tolerance: Patient tolerated treatment well Patient left: in bed;with call bell/phone within reach Nurse Communication: Mobility status PT Visit Diagnosis: Pain;Other abnormalities of gait and mobility (R26.89);Muscle weakness (generalized) (M62.81);Difficulty in walking, not elsewhere classified (R26.2) Pain - Right/Left: Right Pain - part of body: Hip    Time: 8657-8469 PT Time Calculation (min) (ACUTE ONLY): 11 min   Charges:   PT Evaluation $PT Eval Low Complexity: 1 Low   PT General Charges $$ ACUTE PT VISIT: 1 Visit         Aleda Grana, PT, DPT 05/30/23, 12:00 PM   Sandi Mariscal 05/30/2023, 11:57 AM

## 2023-05-30 NOTE — Progress Notes (Signed)
 Pharmacy Antibiotic Note  Jimmy Bishop is a 63 y.o. male admitted on 05/29/2023 with HCAP.  Pharmacy has been consulted for Cefepime & Vancomycin dosing.  Plan: Cefepime 2 gm q8hr per indication & renal fxn.  Pt given Vancomycin 1000 mg once. Vancomycin 1500 mg IV Q 12 hrs. Goal AUC 400-550. Expected AUC: 496 SCr used: 0.79  Pharmacy will continue to follow and will adjust abx dosing whenever warranted.  Temp (24hrs), Avg:97.7 F (36.5 C), Min:97.7 F (36.5 C), Max:97.7 F (36.5 C)   Recent Labs  Lab 05/25/23 0359 05/29/23 1802 05/29/23 1951  WBC 15.1* 12.6*  --   CREATININE 1.08 0.79  --   LATICACIDVEN  --   --  2.1*    Estimated Creatinine Clearance: 110.3 mL/min (by C-G formula based on SCr of 0.79 mg/dL).    Allergies  Allergen Reactions   Vicodin [Hydrocodone-Acetaminophen] Hives and Rash    Antimicrobials this admission: 3/09 Cefepime >>  3/09 Vancomycin >>   Microbiology results: 3/09 BCx: Pending 3/09 Sputum: Sent   Thank you for allowing pharmacy to be a part of this patient's care.  Otelia Sergeant, PharmD, MBA 05/30/2023 12:06 AM

## 2023-05-30 NOTE — Progress Notes (Signed)
 PHARMACY - PHYSICIAN COMMUNICATION CRITICAL VALUE ALERT - BLOOD CULTURE IDENTIFICATION (BCID)  Jimmy Bishop is an 63 y.o. male who presented to [facility] on 05/29/2023 with a chief complaint of shortness of breath  Assessment:  blood culture from 3/9 with GNR in 1 of 4 bottles currently, BCID did not detect any organism on the panel.  He did have recent THA on 3/5 and currently being treated for pneumonia  Name of physician (or Provider) Contacted: Dr Clide Dales  Current antibiotics: Vancomycin and cefepime   Changes to prescribed antibiotics recommended:  Recommendations accepted by provider. Stop vancomycin   Results for orders placed or performed during the hospital encounter of 05/29/23  Blood Culture ID Panel (Reflexed) (Collected: 05/29/2023  7:50 PM)  Result Value Ref Range   Enterococcus faecalis NOT DETECTED NOT DETECTED   Enterococcus Faecium NOT DETECTED NOT DETECTED   Listeria monocytogenes NOT DETECTED NOT DETECTED   Staphylococcus species NOT DETECTED NOT DETECTED   Staphylococcus aureus (BCID) NOT DETECTED NOT DETECTED   Staphylococcus epidermidis NOT DETECTED NOT DETECTED   Staphylococcus lugdunensis NOT DETECTED NOT DETECTED   Streptococcus species NOT DETECTED NOT DETECTED   Streptococcus agalactiae NOT DETECTED NOT DETECTED   Streptococcus pneumoniae NOT DETECTED NOT DETECTED   Streptococcus pyogenes NOT DETECTED NOT DETECTED   A.calcoaceticus-baumannii NOT DETECTED NOT DETECTED   Bacteroides fragilis NOT DETECTED NOT DETECTED   Enterobacterales NOT DETECTED NOT DETECTED   Enterobacter cloacae complex NOT DETECTED NOT DETECTED   Escherichia coli NOT DETECTED NOT DETECTED   Klebsiella aerogenes NOT DETECTED NOT DETECTED   Klebsiella oxytoca NOT DETECTED NOT DETECTED   Klebsiella pneumoniae NOT DETECTED NOT DETECTED   Proteus species NOT DETECTED NOT DETECTED   Salmonella species NOT DETECTED NOT DETECTED   Serratia marcescens NOT DETECTED NOT DETECTED    Haemophilus influenzae NOT DETECTED NOT DETECTED   Neisseria meningitidis NOT DETECTED NOT DETECTED   Pseudomonas aeruginosa NOT DETECTED NOT DETECTED   Stenotrophomonas maltophilia NOT DETECTED NOT DETECTED   Candida albicans NOT DETECTED NOT DETECTED   Candida auris NOT DETECTED NOT DETECTED   Candida glabrata NOT DETECTED NOT DETECTED   Candida krusei NOT DETECTED NOT DETECTED   Candida parapsilosis NOT DETECTED NOT DETECTED   Candida tropicalis NOT DETECTED NOT DETECTED   Cryptococcus neoformans/gattii NOT DETECTED NOT DETECTED    Juliette Alcide, PharmD, BCPS, BCIDP Work Cell: [phone] 05/30/2023 12:23 PM

## 2023-05-30 NOTE — Consult Note (Signed)
 ORTHOPAEDIC CONSULTATION  REQUESTING PHYSICIAN: Marcelino Duster, MD  Chief Complaint:   Status post right total hip arthroplasty.  History of Present Illness: Jimmy Bishop is a 63 y.o. male with a history of diabetes, hypertension, and COPD who is now 6 days out from a right total hip arthroplasty.  The patient was readmitted yesterday afternoon after presenting to the emergency room complaining of shortness of breath.  A chest x-ray in the emergency room demonstrated a right upper lobe infiltrate, concerning for pneumonia.  He is now being treated for this pneumonia with IV antibiotics.  On admission, the patient's dressing was noted to be saturated with serous drainage and so orthopedics has been consulted to assess his wound.  The patient denies any significant pain to the right hip region, and denies any postoperative fevers.  Past Medical History:  Diagnosis Date   Allergy    Arthritis    left knee   COPD (chronic obstructive pulmonary disease) (HCC)    Diabetes mellitus without complication (HCC)    Hypertension    Primary osteoarthritis of right hip    Past Surgical History:  Procedure Laterality Date   ANKLE SURGERY     ARM WOUND REPAIR / CLOSURE     Infected from dog bite   COLONOSCOPY WITH PROPOFOL N/A 07/20/2017   Procedure: COLONOSCOPY WITH PROPOFOL;  Surgeon: Toledo, Boykin Nearing, MD;  Location: [facility];  Service: Gastroenterology;  Laterality: N/A;   IRRIGATION AND DEBRIDEMENT ABSCESS Right 09/09/2020   Procedure: IRRIGATION AND DEBRIDEMENT ABSCESS-RIGHT FOREARM;  Surgeon: Carolan Shiver, MD;  Location: [facility];  Service: General;  Laterality: Right;   NECK SURGERY     POSTERIOR CERVICAL FUSION/FORAMINOTOMY N/A 04/18/2015   Procedure: Posterior cervical fusion with lateral mass fixation - Cervical three-Thoracic one, cervical laminectomy Cervical three-Cervical seven;  Surgeon: Tia Alert, MD;  Location: [facility];  Service: Neurosurgery;  Laterality: N/A;   SKIN GRAFT     TOTAL HIP ARTHROPLASTY Right 05/24/2023   Procedure: ARTHROPLASTY, HIP, TOTAL,POSTERIOR APPROACH;  Surgeon: Christena Flake, MD;  Location: [facility] ORS;  Service: Orthopedics;  Laterality: Right;   Social History   Socioeconomic History   Marital status: Divorced    Spouse name: Not on file   Number of children: Not on file   Years of education: Not on file   Highest education level: Not on file  Occupational History   Not on file  Tobacco Use   Smoking status: Every Day    Current packs/day: 1.00    Average packs/day: 1 pack/day for 40.0 years (40.0 ttl pk-yrs)    Types: Cigarettes   Smokeless tobacco: Never  Substance and Sexual Activity   Alcohol use: Yes    Alcohol/week: 6.0 standard drinks of alcohol    Types: 6 Cans of beer per week    Comment: occ   Drug use: Yes    Types: Marijuana   Sexual activity: Not Currently  Other Topics Concern   Not on file  Social History Narrative   Per conversation with patient's mother/DPR Janett Labella [phone] patient has limited social support and will be dependent upon her and niece for assistance at home with post-d/c needs.   Social Drivers of Corporate investment banker Strain: Low Risk  (05/24/2022)   Received from [facility], [facility]   Overall Financial Resource Strain (CARDIA)    Difficulty of Paying Living Expenses: Not very hard  Food Insecurity: No Food Insecurity (05/30/2023)  Hunger Vital Sign    Worried About Running Out of Food in the Last Year: Never true    Ran Out of Food in the Last Year: Never true  Transportation Needs: No Transportation Needs (05/30/2023)   PRAPARE - Administrator, Civil Service (Medical): No    Lack of Transportation (Non-Medical): No  Physical Activity: Not on file  Stress: Not on file  Social Connections: Not on file   Family History  Problem Relation Age of Onset   Lung  cancer Mother    CAD Mother    Cancer Father    Lung cancer Sister    Allergies  Allergen Reactions   Vicodin [Hydrocodone-Acetaminophen] Hives and Rash   Prior to Admission medications   Medication Sig Start Date End Date Taking? Authorizing Provider  acetaminophen (TYLENOL) 500 MG tablet Take 2 tablets (1,000 mg total) by mouth every 6 (six) hours. 05/25/23  Yes Anson Oregon, PA-C  apixaban (ELIQUIS) 2.5 MG TABS tablet Take 1 tablet (2.5 mg total) by mouth 2 (two) times daily. 05/25/23  Yes Anson Oregon, PA-C  cetirizine (ZYRTEC) 10 MG tablet Take 1 tablet (10 mg total) by mouth daily. 03/14/23  Yes Pardue, Monico Blitz, DO  gabapentin (NEURONTIN) 600 MG tablet Take 1 tablet (600 mg total) by mouth 3 (three) times daily. 03/14/23  Yes Pardue, Monico Blitz, DO  metFORMIN (GLUCOPHAGE) 500 MG tablet Take 1 tablet (500 mg total) by mouth 2 (two) times daily with a meal. 03/14/23  Yes Pardue, Sarah N, DO  ondansetron (ZOFRAN) 4 MG tablet Take 1 tablet (4 mg total) by mouth every 6 (six) hours as needed for nausea. 05/25/23  Yes Anson Oregon, PA-C  oxyCODONE (OXY IR/ROXICODONE) 5 MG immediate release tablet Take 1-2 tablets (5-10 mg total) by mouth every 4 (four) hours as needed for moderate pain (pain score 4-6). 05/25/23  Yes Anson Oregon, PA-C  STIOLTO RESPIMAT 2.5-2.5 MCG/ACT AERS Inhale 2 puffs into the lungs daily. 03/14/23  Yes Pardue, Monico Blitz, DO  amLODipine (NORVASC) 10 MG tablet Take 10 mg by mouth daily. 08/27/20   [provider]  Blood Glucose Monitoring Suppl DEVI 1 each by Does not apply route daily before breakfast. May substitute to any manufacturer covered by patient's insurance. 03/14/23   Sherlyn Hay, DO  Glucose Blood (BLOOD GLUCOSE TEST STRIPS) STRP 1 each by In Vitro route daily before breakfast. May substitute to any manufacturer covered by patient's insurance. 03/14/23   Sherlyn Hay, DO  Lancet Device MISC 1 each by Does not apply route daily before  breakfast. May substitute to any manufacturer covered by patient's insurance. 03/14/23   Sherlyn Hay, DO  Lancets Misc. MISC 1 each by Does not apply route daily before breakfast. May substitute to any manufacturer covered by patient's insurance. 03/14/23   Sherlyn Hay, DO  lisinopril (ZESTRIL) 20 MG tablet Take 20 mg by mouth daily. 08/11/20   [provider]  pravastatin (PRAVACHOL) 40 MG tablet Take 40 mg by mouth daily.    [provider]   US Venous Img Lower Bilateral (DVT) Result Date: 05/29/2023 CLINICAL DATA:  Leg edema EXAM: BILATERAL LOWER EXTREMITY VENOUS DOPPLER ULTRASOUND TECHNIQUE: Gray-scale sonography with graded compression, as well as color Doppler and duplex ultrasound were performed to evaluate the lower extremity deep venous systems from the level of the common femoral vein and including the common femoral, femoral, profunda femoral, popliteal and calf veins including the posterior  tibial, peroneal and gastrocnemius veins when visible. The superficial great saphenous vein was also interrogated. Spectral Doppler was utilized to evaluate flow at rest and with distal augmentation maneuvers in the common femoral, femoral and popliteal veins. COMPARISON:  None Available. FINDINGS: RIGHT LOWER EXTREMITY Common Femoral Vein: No evidence of thrombus. Normal compressibility, respiratory phasicity and response to augmentation. Saphenofemoral Junction: No evidence of thrombus. Normal compressibility and flow on color Doppler imaging. Profunda Femoral Vein: No evidence of thrombus. Normal compressibility and flow on color Doppler imaging. Femoral Vein: No evidence of thrombus. Normal compressibility, respiratory phasicity and response to augmentation. Popliteal Vein: No evidence of thrombus. Normal compressibility, respiratory phasicity and response to augmentation. Calf Veins: No evidence of thrombus. Normal compressibility and flow on color Doppler imaging. LEFT LOWER  EXTREMITY Common Femoral Vein: No evidence of thrombus. Normal compressibility, respiratory phasicity and response to augmentation. Saphenofemoral Junction: No evidence of thrombus. Normal compressibility and flow on color Doppler imaging. Profunda Femoral Vein: No evidence of thrombus. Normal compressibility and flow on color Doppler imaging. Femoral Vein: No evidence of thrombus. Normal compressibility, respiratory phasicity and response to augmentation. Popliteal Vein: No evidence of thrombus. Normal compressibility, respiratory phasicity and response to augmentation. Calf Veins: No evidence of thrombus. Normal compressibility and flow on color Doppler imaging. IMPRESSION: No evidence of deep venous thrombosis in either lower extremity. Electronically Signed   By: Feliberto Harts M.D.   On: 05/29/2023 23:46   CT Angio Chest PE W and/or Wo Contrast Result Date: 05/29/2023 CLINICAL DATA:  Shortness of breath and chest pain with right upper lobe opacity on portable chest today. Coughing and right lower extremity edema as well. EXAM: CT ANGIOGRAPHY CHEST WITH CONTRAST TECHNIQUE: Multidetector CT imaging of the chest was performed using the standard protocol during bolus administration of intravenous contrast. Multiplanar CT image reconstructions and MIPs were obtained to evaluate the vascular anatomy. RADIATION DOSE REDUCTION: This exam was performed according to the departmental dose-optimization program which includes automated exposure control, adjustment of the mA and/or kV according to patient size and/or use of iterative reconstruction technique. CONTRAST:  75mL OMNIPAQUE IOHEXOL 350 MG/ML SOLN COMPARISON:  Portable chest today, AP Lat chest 02/23/2023, portable chest 09/09/2020, and CT abdomen and pelvis with contrast 06/25/2014. No prior chest CT. FINDINGS: Cardiovascular: There is mild cardiomegaly with a left chamber predominance. The pulmonary veins are nondistended. There is no pericardial effusion.  There are patchy left main and 2 vessel LAD and right coronary artery calcifications, moderate calcification in the aorta and great vessels and no aneurysm, stenosis or dissection. The pulmonary trunk measures prominent at 3.3 cm consistent with arterial hypertension but no arterial embolus or acute right heart strain findings are seen. Mediastinum/Nodes: There are scattered shotty subcentimeter in short axis mediastinal and hilar nodes no enlarged intrathoracic or axillary nodes. Thyroid gland is unremarkable. The thoracic trachea and main bronchi are patent. The thoracic esophagus unremarkable. Lungs/Pleura: There are paraseptal and centrilobular emphysematous changes in the upper lobes. There are patchy ground-glass opacities in the right upper lobe with underlying interstitial thickening, findings consistent with right upper lobe pneumonia. Small amount of similar disease in the superior segment of the right lower lobe. There is diffuse bronchial thickening. Scattered subsegmental bronchial small airway plugging in the lower lobes. There is posterior atelectasis in both lungs, mild subpleural reticulation in the lower lobes consistent with chronic change. No interstitial edema or pleural effusions are seen. The lungs are otherwise clear with mild elevation right hemidiaphragm. Upper Abdomen: The liver  is mildly steatotic. Portions of the left lobe appear to show subtle capsular nodularity suspicious for early cirrhosis. There is a 1.8 cm rim calcified stone in the proximal gallbladder but no wall thickening or bile duct dilatation. There is abdominal aortic atherosclerosis. Calcified granuloma in the liver dome. Musculoskeletal: Degenerative change and mild dextroscoliosis thoracic spine. There is a dorsal fusion construct in the lower cervical spine ending at T1, evidence of C6 and C7 decompression laminectomies. There is mild anterior wedging and interbody ankylosis T10-11, mild wedging of the T7 vertebral  body, all of which appear chronic. Multilevel healed fracture deformities of the ribs. No acute or other significant osseous findings. No chest wall mass is seen. Review of the MIP images confirms the above findings. IMPRESSION: 1. No evidence of arterial embolus or acute right heart strain. 2. Right upper lobe and superior segment right lower lobe pneumonia. 3. Emphysema. 4. Bronchitis with scattered subsegmental small airway plugging in the lower lobes. 5. Cardiomegaly with aortic and coronary artery atherosclerosis. 6. Prominent pulmonary trunk at 3.3 cm consistent with arterial hypertension. 7. Mild hepatic steatosis with subtle capsular nodularity in the left lobe suspicious for early cirrhosis. No splenomegaly or upper abdominal ascites. 8. Cholelithiasis. 9. Chronic appearing wedging of the T7 and T10-11 vertebral bodies, interbody ankylosis T10-11. Aortic Atherosclerosis (ICD10-I70.0) and Emphysema (ICD10-J43.9). Electronically Signed   By: Almira Bar M.D.   On: 05/29/2023 21:31   DG Chest Portable 1 View Result Date: 05/29/2023 CLINICAL DATA:  Chest pain EXAM: PORTABLE CHEST 1 VIEW COMPARISON:  Chest radiograph 02/23/2023 FINDINGS: Monitoring leads overlie the patient. Stable cardiac and mediastinal contours. Minimal atelectasis lower lung bases bilaterally. Focal consolidation right upper lung. Chronic posttraumatic deformity left ribs. IMPRESSION: Focal consolidation right upper lung concerning for pneumonia. Recommend follow-up chest radiograph in 6-8 weeks to ensure resolution. Electronically Signed   By: Annia Belt M.D.   On: 05/29/2023 18:42    Positive ROS: All other systems have been reviewed and were otherwise negative with the exception of those mentioned in the HPI and as above.  Physical Exam: General:  Alert, no acute distress Psychiatric:  Patient is competent for consent with normal mood and affect   Cardiovascular:  No pedal edema Respiratory:  No wheezing, non-labored  breathing GI:  Abdomen is soft and non-tender Skin:  No lesions in the area of chief complaint Neurologic:  Sensation intact distally Lymphatic:  No axillary or cervical lymphadenopathy  Orthopedic Exam:  Orthopedic examination is limited to the right hip and lower extremity.  There is moderate serous drainage on the patient's postoperative OpSite dressing, but no purulent drainage is noted from the wound.  Skin inspection is notable for mild-moderate swelling diffusely around the hip and thigh, but no erythema, ecchymosis, abrasions, or other skin abnormalities are identified.  He appears to be moving his hip easily and without pain.  He is grossly neurovascularly intact to the right lower extremity and foot.  Assessment: Serous wound drainage status post right total hip arthroplasty.  Plan: The treatment options and been discussed with the patient.  I do not see any concern for infection of the patient's surgical wound at this point.  His dressing is changed and a fresh OpSite dressing applied.  This dressing may be changed as necessary by the nursing staff subsequently.  He may be mobilized with physical therapy, weightbearing as tolerated on the right leg.  He is to maintain posterior hip precautions.    Maryagnes Amos, MD  Beeper #:  [phone]  05/30/2023 12:47 PM

## 2023-05-30 NOTE — Evaluation (Signed)
 Occupational Therapy Evaluation Patient Details Name: Jimmy Bishop MRN: [id number] DOB: [birth year] Today's Date: 05/30/2023   History of Present Illness   Pt is a 63 y/o M admitted on 05/29/23 after presenting with c/o SOB x 2 weeks, cough. CTA showed RUL & RLL PNA. PMH: HTN, HLD, DM, COPD, tobacco abuse, anemia, recent R hip replacement 3/5     Clinical Impressions Pt was seen for OT evaluation this date. PTA, pt was receiving HH therapy services at home following R THA surgery last week. His mother and brother were assisting him with LB ADLs at home d/t not having long handled equipment.  Pt presents to acute OT demonstrating impaired ADL performance and functional mobility 2/2 weakness, pain and low activity tolerance. Placed on RA and dropped to 88% with getting to EOB, therefore placed back on 2L. Pt currently requires Min A for bed mobility, Max A for LB ADLs, SBA for STS to RW and CGA for ~20 feet mobility to the bathroom and back. CGA to urinate at toilet and manage clothing as well as perform hand hygiene at the sink. Sp02 on 2L throughout with reading of 93% with return to bed. Pt able to recall 2/3 posterior hiop precautions prior to getting OOB. Pt would benefit from skilled OT services to address noted impairments and functional limitations (see below for any additional details) in order to maximize safety and independence while minimizing falls risk and caregiver burden. Do anticipate the need for follow up OT services upon acute hospital DC.     P If plan is discharge home, recommend the following:   A little help with walking and/or transfers;A lot of help with bathing/dressing/bathroom;Assistance with cooking/housework;Help with stairs or ramp for entrance;Assist for transportation     Functional Status Assessment   Patient has had a recent decline in their functional status and demonstrates the ability to make significant improvements in function in a reasonable and  predictable amount of time.     Equipment Recommendations   BSC/3in1;Other (comment) (long handled equipment)     Recommendations for Other Services         Precautions/Restrictions   Precautions Precautions: Fall;Posterior Hip Recall of Precautions/Restrictions: Intact Precaution/Restrictions Comments: 2/3 recalled Restrictions Weight Bearing Restrictions Per Provider Order: Yes RLE Weight Bearing Per Provider Order: Weight bearing as tolerated     Mobility Bed Mobility Overal bed mobility: Needs Assistance Bed Mobility: Supine to Sit, Sit to Supine     Supine to sit: Contact guard, Min assist Sit to supine: Supervision   General bed mobility comments: MIN/CGA to reach EOB, SUP to return to supine    Transfers Overall transfer level: Needs assistance Equipment used: Rolling walker (2 wheels) Transfers: Sit to/from Stand Sit to Stand: Supervision           General transfer comment: SUP for STS from EOB with cueing for proper hand/feet placement, CGA for mobility to the bathroom and back      Balance Overall balance assessment: Needs assistance Sitting-balance support: Feet supported Sitting balance-Leahy Scale: Good     Standing balance support: During functional activity, Bilateral upper extremity supported, Reliant on assistive device for balance Standing balance-Leahy Scale: Fair Standing balance comment: CGA and RW use for mobility and standing at sink for hand hygiene                           ADL either performed or assessed with clinical judgement   ADL Overall  ADL's : Needs assistance/impaired     Grooming: Wash/dry hands;Standing;Supervision/safety Grooming Details (indicate cue type and reason): at sink in bathroom             Lower Body Dressing: Maximal assistance;Total assistance;Bed level Lower Body Dressing Details (indicate cue type and reason): to don socks Toilet Transfer: Rolling walker (2 wheels);Contact  guard assist Toilet Transfer Details (indicate cue type and reason): stood to urinate at toilet with CGA Toileting- Clothing Manipulation and Hygiene: Supervision/safety;Contact guard assist Toileting - Clothing Manipulation Details (indicate cue type and reason): managed boxer briefs to urinate at toilet             Vision         Perception         Praxis         Pertinent Vitals/Pain Pain Assessment Pain Assessment: 0-10 Pain Score: 6  Pain Location: R hip Pain Descriptors / Indicators: Discomfort Pain Intervention(s): Monitored during session, Limited activity within patient's tolerance, Repositioned     Extremity/Trunk Assessment Upper Extremity Assessment Upper Extremity Assessment: Overall WFL for tasks assessed   Lower Extremity Assessment Lower Extremity Assessment: Generalized weakness;RLE deficits/detail RLE Deficits / Details: recent R THA       Communication Communication Communication: No apparent difficulties   Cognition Arousal: Alert Behavior During Therapy: WFL for tasks assessed/performed               OT - Cognition Comments: A and O x4; recalled 2/3 posterior hip precautions                 Following commands: Intact       Cueing  General Comments   Cueing Techniques: Verbal cues  on RA dropped to 88% with bed mobility, improved to 90's with 2L and maintained   Exercises Other Exercises Other Exercises: Edu on role of OT in acute setting and posterior hip precautions.   Shoulder Instructions      Home Living Family/patient expects to be discharged to:: Private residence Living Arrangements: Parent;Other relatives Available Help at Discharge: Family;Available 24 hours/day Type of Home: House Home Access: Stairs to enter [employer] of Steps: 4-5 back of the house; 4 steps on front Entrance Stairs-Rails: Can reach both;Left;Right Home Layout: One level               Home Equipment: Grab bars -  toilet;Cane - single point;Rolling Walker (2 wheels)          Prior Functioning/Environment               Mobility Comments: Prior to hip sx pt was ambulatory with SPC with minimal driving, since sx pt has been ambulatory with RW. ADLs Comments: prior to hip fx was MOD I/IND  with ADLs; has had assist for LB ADLs and IADLs from brother and mother    OT Problem List: Decreased strength;Decreased activity tolerance;Decreased knowledge of use of DME or AE;Pain   OT Treatment/Interventions: Self-care/ADL training;Energy conservation;DME and/or AE instruction;Therapeutic activities      OT Goals(Current goals can be found in the care plan section)   Acute Rehab OT Goals Patient Stated Goal: return home OT Goal Formulation: With patient Time For Goal Achievement: 06/13/23 Potential to Achieve Goals: Good ADL Goals Pt Will Perform Lower Body Bathing: sit to/from stand;sitting/lateral leans;with adaptive equipment;with contact guard assist;with supervision Pt Will Perform Lower Body Dressing: with supervision;with adaptive equipment;sitting/lateral leans;sit to/from stand Pt Will Transfer to Toilet: with modified independence;grab bars;ambulating Pt Will Perform  Toileting - Clothing Manipulation and hygiene: with modified independence;sitting/lateral leans;sit to/from stand Additional ADL Goal #1: Pt will recall 3/3 posterior hip precautions during sessions to prevent injury to R hip during ADLs and mobility.   OT Frequency:  Min 3X/week    Co-evaluation              AM-PAC OT "6 Clicks" Daily Activity     Outcome Measure Help from another person eating meals?: None Help from another person taking care of personal grooming?: A Little Help from another person toileting, which includes using toliet, bedpan, or urinal?: A Little Help from another person bathing (including washing, rinsing, drying)?: A Lot Help from another person to put on and taking off regular upper body  clothing?: None Help from another person to put on and taking off regular lower body clothing?: A Lot 6 Click Score: 18   End of Session Equipment Utilized During Treatment: Gait belt;Rolling walker (2 wheels);Oxygen Nurse Communication: Mobility status  Activity Tolerance: Patient tolerated treatment well Patient left: in bed  OT Visit Diagnosis: Unsteadiness on feet (R26.81);Other abnormalities of gait and mobility (R26.89);History of falling (Z91.81);Muscle weakness (generalized) (M62.81)                Time: 4098-1191 OT Time Calculation (min): 25 min Charges:  OT General Charges $OT Visit: 1 Visit OT Evaluation $OT Eval Moderate Complexity: 1 Mod OT Treatments $Self Care/Home Management : 8-22 mins  Marzell Allemand, OTR/L 05/30/23, 12:51 PM  Analya Louissaint E Jalayiah Bibian 05/30/2023, 12:45 PM

## 2023-05-30 NOTE — ED Notes (Signed)
 Respiratory at bedside.

## 2023-05-30 NOTE — Progress Notes (Signed)
 Progress Note   Patient: Jimmy Bishop YNW:[id number] DOB: 20-Aug-1960 DOA: 05/29/2023     1 DOS: the patient was seen and examined on 05/30/2023   Brief hospital course: Jimmy Bishop is a 63 y.o. male with medical history significant of HTN, HLD, DM, COPD, tobacco abuse, anemia, s/p of recent right hip replacement on 3/5 (on Eliquis), who presents with SOB.  Patient chest x-ray showed right upper lobe infiltration, CT negative for PE, showed right upper lobe and superior segment right lower lobe pneumonia, emphysema admitted to hospitalist service for IV antibiotic therapy and further management.  Assessment and Plan: HCAP Hypoxia Gram negative bacteremia- Continue supplemental oxygen as needed to maintain saturation greater than 92%. Continue bronchodilator therapy, Mucinex for cough. Solu-Medrol 80 mg daily. Stop IV vancomycin as blood cultures positive for gram-negative rods. Continue cefepime therapy.  Lower extremity edema BNP 66, last echo preserved ejection fraction. Will hold norvasc due to his edema. Encourage leg elevation.  Type 2 diabetes- Continue accucheks, sliding scale insulin,  Status post right hip replacement Does not seem to be infected. Continue Tylenol, oxycodone for pain. Continue home dose Eliquis for DVT prophylaxis. PT OT evaluation for ambulation.  COPD Continue bronchodilators as needed.  IV steroids, Mucinex as above.  Hypertension: Blood pressure stable. Stop amlodipine, start Lisinopril home dose.  IV hydralazine as needed.  Hyperlipidemia-continue pravastatin.  Obesity with BMI 32.64- Encourage diet, exercise and weight reduction.  Tobacco abuse- Discussed ill effects of tobacco. Advised nicotine patches.     Out of bed to chair. Incentive spirometry. Nursing supportive care. Fall, aspiration precautions. DVT prophylaxis   Code Status: Full Code  Subjective: Patient is seen and examined today morning.  PT OT at bedside  evaluating him.  He is short of breath and did complain of burning urination.  Eating fair.  Currently on 2 L supplemental oxygen.  Physical Exam: Vitals:   05/30/23 1000 05/30/23 1030 05/30/23 1115 05/30/23 1159  BP: (!) 140/63 135/74 (!) 144/107 136/72  Pulse: 74 85 87 87  Resp: 14 17 20 20   Temp:    97.9 F (36.6 C)  TempSrc:      SpO2: 98% 95% 98% 98%    General - Elderly obese Caucasian male, mild A distress HEENT - PERRLA, EOMI, atraumatic head, non tender sinuses. Lung - Clear, basal rales, right-sided rhonchi, no wheezes. Heart - S1, S2 heard, no murmurs, rubs, 1+ pedal edema. Abdomen - Soft, non tender, obese, bowel sounds good Neuro - Alert, awake and oriented x 3, non focal exam. Skin - Warm and dry.  Data Reviewed:      Latest Ref Rng & Units 05/30/2023    4:35 AM 05/29/2023    6:02 PM 05/25/2023    3:59 AM  CBC  WBC 4.0 - 10.5 K/uL 11.3  12.6  15.1   Hemoglobin 13.0 - 17.0 g/dL 65.7  84.6  96.2   Hematocrit 39.0 - 52.0 % 39.6  42.8  38.3   Platelets 150 - 400 K/uL 421  463  343       Latest Ref Rng & Units 05/30/2023    4:35 AM 05/29/2023    6:02 PM 05/25/2023    3:59 AM  BMP  Glucose 70 - 99 mg/dL 952  841  324   BUN 8 - 23 mg/dL 28  27  27    Creatinine 0.61 - 1.24 mg/dL 4.01  0.27  2.53   Sodium 135 - 145 mmol/L 135  135  134  Potassium 3.5 - 5.1 mmol/L 5.5  4.2  5.0   Chloride 98 - 111 mmol/L 101  103  104   CO2 22 - 32 mmol/L 25  22  23    Calcium 8.9 - 10.3 mg/dL 9.2  9.6  8.9    US Venous Img Lower Bilateral (DVT) Result Date: 05/29/2023 CLINICAL DATA:  Leg edema EXAM: BILATERAL LOWER EXTREMITY VENOUS DOPPLER ULTRASOUND TECHNIQUE: Gray-scale sonography with graded compression, as well as color Doppler and duplex ultrasound were performed to evaluate the lower extremity deep venous systems from the level of the common femoral vein and including the common femoral, femoral, profunda femoral, popliteal and calf veins including the posterior tibial, peroneal  and gastrocnemius veins when visible. The superficial great saphenous vein was also interrogated. Spectral Doppler was utilized to evaluate flow at rest and with distal augmentation maneuvers in the common femoral, femoral and popliteal veins. COMPARISON:  None Available. FINDINGS: RIGHT LOWER EXTREMITY Common Femoral Vein: No evidence of thrombus. Normal compressibility, respiratory phasicity and response to augmentation. Saphenofemoral Junction: No evidence of thrombus. Normal compressibility and flow on color Doppler imaging. Profunda Femoral Vein: No evidence of thrombus. Normal compressibility and flow on color Doppler imaging. Femoral Vein: No evidence of thrombus. Normal compressibility, respiratory phasicity and response to augmentation. Popliteal Vein: No evidence of thrombus. Normal compressibility, respiratory phasicity and response to augmentation. Calf Veins: No evidence of thrombus. Normal compressibility and flow on color Doppler imaging. LEFT LOWER EXTREMITY Common Femoral Vein: No evidence of thrombus. Normal compressibility, respiratory phasicity and response to augmentation. Saphenofemoral Junction: No evidence of thrombus. Normal compressibility and flow on color Doppler imaging. Profunda Femoral Vein: No evidence of thrombus. Normal compressibility and flow on color Doppler imaging. Femoral Vein: No evidence of thrombus. Normal compressibility, respiratory phasicity and response to augmentation. Popliteal Vein: No evidence of thrombus. Normal compressibility, respiratory phasicity and response to augmentation. Calf Veins: No evidence of thrombus. Normal compressibility and flow on color Doppler imaging. IMPRESSION: No evidence of deep venous thrombosis in either lower extremity. Electronically Signed   By: Feliberto Harts M.D.   On: 05/29/2023 23:46   CT Angio Chest PE W and/or Wo Contrast Result Date: 05/29/2023 CLINICAL DATA:  Shortness of breath and chest pain with right upper lobe  opacity on portable chest today. Coughing and right lower extremity edema as well. EXAM: CT ANGIOGRAPHY CHEST WITH CONTRAST TECHNIQUE: Multidetector CT imaging of the chest was performed using the standard protocol during bolus administration of intravenous contrast. Multiplanar CT image reconstructions and MIPs were obtained to evaluate the vascular anatomy. RADIATION DOSE REDUCTION: This exam was performed according to the departmental dose-optimization program which includes automated exposure control, adjustment of the mA and/or kV according to patient size and/or use of iterative reconstruction technique. CONTRAST:  75mL OMNIPAQUE IOHEXOL 350 MG/ML SOLN COMPARISON:  Portable chest today, AP Lat chest 02/23/2023, portable chest 09/09/2020, and CT abdomen and pelvis with contrast 06/25/2014. No prior chest CT. FINDINGS: Cardiovascular: There is mild cardiomegaly with a left chamber predominance. The pulmonary veins are nondistended. There is no pericardial effusion. There are patchy left main and 2 vessel LAD and right coronary artery calcifications, moderate calcification in the aorta and great vessels and no aneurysm, stenosis or dissection. The pulmonary trunk measures prominent at 3.3 cm consistent with arterial hypertension but no arterial embolus or acute right heart strain findings are seen. Mediastinum/Nodes: There are scattered shotty subcentimeter in short axis mediastinal and hilar nodes no enlarged intrathoracic or axillary nodes. Thyroid  gland is unremarkable. The thoracic trachea and main bronchi are patent. The thoracic esophagus unremarkable. Lungs/Pleura: There are paraseptal and centrilobular emphysematous changes in the upper lobes. There are patchy ground-glass opacities in the right upper lobe with underlying interstitial thickening, findings consistent with right upper lobe pneumonia. Small amount of similar disease in the superior segment of the right lower lobe. There is diffuse bronchial  thickening. Scattered subsegmental bronchial small airway plugging in the lower lobes. There is posterior atelectasis in both lungs, mild subpleural reticulation in the lower lobes consistent with chronic change. No interstitial edema or pleural effusions are seen. The lungs are otherwise clear with mild elevation right hemidiaphragm. Upper Abdomen: The liver is mildly steatotic. Portions of the left lobe appear to show subtle capsular nodularity suspicious for early cirrhosis. There is a 1.8 cm rim calcified stone in the proximal gallbladder but no wall thickening or bile duct dilatation. There is abdominal aortic atherosclerosis. Calcified granuloma in the liver dome. Musculoskeletal: Degenerative change and mild dextroscoliosis thoracic spine. There is a dorsal fusion construct in the lower cervical spine ending at T1, evidence of C6 and C7 decompression laminectomies. There is mild anterior wedging and interbody ankylosis T10-11, mild wedging of the T7 vertebral body, all of which appear chronic. Multilevel healed fracture deformities of the ribs. No acute or other significant osseous findings. No chest wall mass is seen. Review of the MIP images confirms the above findings. IMPRESSION: 1. No evidence of arterial embolus or acute right heart strain. 2. Right upper lobe and superior segment right lower lobe pneumonia. 3. Emphysema. 4. Bronchitis with scattered subsegmental small airway plugging in the lower lobes. 5. Cardiomegaly with aortic and coronary artery atherosclerosis. 6. Prominent pulmonary trunk at 3.3 cm consistent with arterial hypertension. 7. Mild hepatic steatosis with subtle capsular nodularity in the left lobe suspicious for early cirrhosis. No splenomegaly or upper abdominal ascites. 8. Cholelithiasis. 9. Chronic appearing wedging of the T7 and T10-11 vertebral bodies, interbody ankylosis T10-11. Aortic Atherosclerosis (ICD10-I70.0) and Emphysema (ICD10-J43.9). Electronically Signed   By: Almira Bar M.D.   On: 05/29/2023 21:31   DG Chest Portable 1 View Result Date: 05/29/2023 CLINICAL DATA:  Chest pain EXAM: PORTABLE CHEST 1 VIEW COMPARISON:  Chest radiograph 02/23/2023 FINDINGS: Monitoring leads overlie the patient. Stable cardiac and mediastinal contours. Minimal atelectasis lower lung bases bilaterally. Focal consolidation right upper lung. Chronic posttraumatic deformity left ribs. IMPRESSION: Focal consolidation right upper lung concerning for pneumonia. Recommend follow-up chest radiograph in 6-8 weeks to ensure resolution. Electronically Signed   By: Annia Belt M.D.   On: 05/29/2023 18:42    Family Communication: Discussed with patient, he understand and agree. All questions answered.  Disposition: Status is: Inpatient Remains inpatient appropriate because: hypoxia, pneumonia on IV antibiotics.  Planned Discharge Destination: Home with Home Health     Time spent: 39 minutes  Author: Marcelino Duster, MD 05/30/2023 4:13 PM Secure chat 7am to 7pm For on call review [email or web address].

## 2023-05-30 NOTE — Consult Note (Signed)
 WOC Nurse Consult Note: Reason for Consult: surgical wound right hip Wound type: surgical  Pressure Injury POA: NA  Dressing procedure/placement/frequency: Patient with fresh surgical dressing in place. Tift to nursing staff, MD, orthopedic surgeon to guide staff for dressing.    Will not consult for that reason   Harolyn Cocker [facility], CNS, CWON-AP [phone]

## 2023-05-30 NOTE — ED Notes (Signed)
 Assumed patient care at approximately 0715 and received report from the previous nurse.

## 2023-05-31 DIAGNOSIS — J449 Chronic obstructive pulmonary disease, unspecified: Secondary | ICD-10-CM | POA: Diagnosis not present

## 2023-05-31 DIAGNOSIS — E119 Type 2 diabetes mellitus without complications: Secondary | ICD-10-CM | POA: Diagnosis not present

## 2023-05-31 DIAGNOSIS — Z96641 Presence of right artificial hip joint: Secondary | ICD-10-CM | POA: Diagnosis not present

## 2023-05-31 DIAGNOSIS — Z471 Aftercare following joint replacement surgery: Secondary | ICD-10-CM | POA: Diagnosis not present

## 2023-05-31 DIAGNOSIS — Z720 Tobacco use: Secondary | ICD-10-CM | POA: Diagnosis not present

## 2023-05-31 DIAGNOSIS — E669 Obesity, unspecified: Secondary | ICD-10-CM | POA: Diagnosis not present

## 2023-05-31 DIAGNOSIS — R600 Localized edema: Secondary | ICD-10-CM | POA: Diagnosis not present

## 2023-05-31 DIAGNOSIS — I10 Essential (primary) hypertension: Secondary | ICD-10-CM | POA: Diagnosis not present

## 2023-05-31 DIAGNOSIS — J189 Pneumonia, unspecified organism: Secondary | ICD-10-CM | POA: Diagnosis not present

## 2023-05-31 LAB — BASIC METABOLIC PANEL
Anion gap: 6 (ref 5–15)
BUN: 27 mg/dL — ABNORMAL HIGH (ref 8–23)
CO2: 28 mmol/L (ref 22–32)
Calcium: 9.4 mg/dL (ref 8.9–10.3)
Chloride: 98 mmol/L (ref 98–111)
Creatinine, Ser: 0.83 mg/dL (ref 0.61–1.24)
GFR, Estimated: >60 mL/min (ref >60)
Glucose, Bld: 203 mg/dL — ABNORMAL HIGH (ref 70–99)
Potassium: 5.2 mmol/L — ABNORMAL HIGH (ref 3.5–5.1)
Sodium: 132 mmol/L — ABNORMAL LOW (ref 135–145)

## 2023-05-31 LAB — GLUCOSE, CAPILLARY
Glucose-Capillary: 175 mg/dL — ABNORMAL HIGH (ref 70–99)
Glucose-Capillary: 154 mg/dL — ABNORMAL HIGH (ref 70–99)
Glucose-Capillary: 225 mg/dL — ABNORMAL HIGH (ref 70–99)
Glucose-Capillary: 185 mg/dL — ABNORMAL HIGH (ref 70–99)

## 2023-05-31 LAB — CBC
HCT: 38.3 % — ABNORMAL LOW (ref 39.0–52.0)
Hemoglobin: 12.4 g/dL — ABNORMAL LOW (ref 13.0–17.0)
MCH: 29.4 pg (ref 26.0–34.0)
MCHC: 32.4 g/dL (ref 30.0–36.0)
MCV: 90.8 fL (ref 80.0–100.0)
Platelets: 414 10*3/uL — ABNORMAL HIGH (ref 150–400)
RBC: 4.22 MIL/uL (ref 4.22–5.81)
RDW: 13.6 % (ref 11.5–15.5)
WBC: 10.4 10*3/uL (ref 4.0–10.5)
nRBC: 0 % (ref 0.0–0.2)

## 2023-05-31 NOTE — Progress Notes (Signed)
 Physical Therapy Treatment Patient Details Name: Jimmy Bishop MRN: [id number] DOB: July 04, 1960 Today's Date: 05/31/2023   History of Present Illness Pt is a 63 y/o M admitted on 05/29/23 after presenting with c/o SOB x 2 weeks, cough. CTA showed RUL & RLL PNA. PMH: HTN, HLD, DM, COPD, tobacco abuse, anemia, recent R hip replacement 3/5    PT Comments  Pt is progressing with mobility demonstrating steadiness and safety at an overall supervision level for transfers, dynamic standing balance, gait with RW (100'), and step negotiation.  Pt performed mobility on RA, SPO2 >/=90%. Continued PT will assist pt towards greater dynamic standing balance, LE strengthening, and activity tolerance to increase safety and independence with functional mobility.     If plan is discharge home, recommend the following: A little help with walking and/or transfers;A little help with bathing/dressing/bathroom;Assist for transportation;Help with stairs or ramp for entrance;Assistance with cooking/housework   Can travel by private vehicle        Equipment Recommendations  None recommended by PT    Recommendations for Other Services       Precautions / Restrictions Precautions Precautions: Fall;Posterior Hip Precaution Booklet Issued: Yes (comment) Recall of Precautions/Restrictions: Intact Restrictions Weight Bearing Restrictions Per Provider Order: Yes RLE Weight Bearing Per Provider Order: Weight bearing as tolerated     Mobility  Bed Mobility               General bed mobility comments: NT Pt up in recliner throughout session    Transfers Overall transfer level: Needs assistance Equipment used: Rolling walker (2 wheels) Transfers: Sit to/from Stand, Bed to chair/wheelchair/BSC Sit to Stand: Modified independent (Device/Increase time)   Step pivot transfers: Supervision       General transfer comment: Pt had safe hand placement and technique.    Ambulation/Gait Ambulation/Gait  assistance: Supervision Gait Distance (Feet): 100 Feet Assistive device: Rolling walker (2 wheels) Gait Pattern/deviations: Antalgic, Decreased stride length, Step-through pattern Gait velocity: decreased     General Gait Details: cues to roll walker vs. picking it up for energy conservation.   Stairs Stairs: Yes Stairs assistance: Supervision Stair Management: Two rails, Step to pattern Number of Stairs: 4 General stair comments: kinda sorta following technique cues, but generally appears safe and successful   Wheelchair Mobility     Tilt Bed    Modified Rankin (Stroke Patients Only)       Balance Overall balance assessment: Independent Sitting-balance support: Feet supported Sitting balance-Leahy Scale: Good     Standing balance support: During functional activity, Reliant on assistive device for balance, Single extremity supported Standing balance-Leahy Scale: Good Standing balance comment: performed clothes management with intermittent UE support at supervision level, no LOB.                            Communication Communication Communication: No apparent difficulties  Cognition Arousal: Alert Behavior During Therapy: WFL for tasks assessed/performed   PT - Cognitive impairments: No apparent impairments                         Following commands: Intact      Cueing    Exercises      General Comments General comments (skin integrity, edema, etc.): SPO2 RA 91% HR 98bpm with activity.      Pertinent Vitals/Pain Pain Assessment Pain Assessment: 0-10 Pain Score: 7  Pain Location: R hip Pain Descriptors / Indicators: Discomfort Pain Intervention(s):  Monitored during session, Limited activity within patient's tolerance    Home Living                          Prior Function            PT Goals (current goals can now be found in the care plan section) Acute Rehab PT Goals Patient Stated Goal: get better, return  home PT Goal Formulation: With patient Time For Goal Achievement: 06/13/23 Potential to Achieve Goals: Good Progress towards PT goals: Progressing toward goals    Frequency    Min 3X/week      PT Plan      Co-evaluation              AM-PAC PT "6 Clicks" Mobility   Outcome Measure  Help needed turning from your back to your side while in a flat bed without using bedrails?: None Help needed moving from lying on your back to sitting on the side of a flat bed without using bedrails?: A Little Help needed moving to and from a bed to a chair (including a wheelchair)?: A Little Help needed standing up from a chair using your arms (e.g., wheelchair or bedside chair)?: None Help needed to walk in hospital room?: A Little Help needed climbing 3-5 steps with a railing? : A Little 6 Click Score: 20    End of Session Equipment Utilized During Treatment: Gait belt Activity Tolerance: Patient tolerated treatment well Patient left: in bed;with call bell/phone within reach Nurse Communication: Mobility status PT Visit Diagnosis: Pain;Other abnormalities of gait and mobility (R26.89);Muscle weakness (generalized) (M62.81);Difficulty in walking, not elsewhere classified (R26.2) Pain - Right/Left: Right Pain - part of body: Hip     Time: 0913-0926 PT Time Calculation (min) (ACUTE ONLY): 13 min  Charges:    $Therapeutic Activity: 8-22 mins PT General Charges $$ ACUTE PT VISIT: 1 Visit                     Hortencia Conradi, PTA  05/31/23, 9:52 AM

## 2023-05-31 NOTE — Progress Notes (Signed)
 Per Dr Clide Dales, dc tele monitoring

## 2023-05-31 NOTE — Progress Notes (Signed)
 Occupational Therapy Treatment Patient Details Name: Jimmy Bishop MRN: [id number] DOB: Jan 15, 1961 Today's Date: 05/31/2023   History of present illness Pt is a 63 y/o M admitted on 05/29/23 after presenting with c/o SOB x 2 weeks, cough. CTA showed RUL & RLL PNA. PMH: HTN, HLD, DM, COPD, tobacco abuse, anemia, recent R hip replacement 3/5   OT comments  Pt is seated in recliner on arrival. Easily arousable and agreeable to OT session. He reports 7/10 R hip pain following mobility. Pt was educated on all AE/AD to maximize IND with ADL performance to decrease caregiver burden upon return home and educated on where/what to purchase. Pt required MOD I/SUP for STS from recliner and ~280 feet of RW mobility on RA with lowest sp02 89% with improvement to [name] with rest.  Pt returned to recliner with all needs in place and will cont to require skilled acute OT services to maximize his safety and IND to return to PLOF.       If plan is discharge home, recommend the following:  A little help with walking and/or transfers;A lot of help with bathing/dressing/bathroom;Assistance with cooking/housework;Help with stairs or ramp for entrance;Assist for transportation   Equipment Recommendations  BSC/3in1;Other (comment) (long handled equipment)    Recommendations for Other Services      Precautions / Restrictions Precautions Precautions: Fall;Posterior Hip Precaution Booklet Issued: Yes (comment) Recall of Precautions/Restrictions: Intact Restrictions Weight Bearing Restrictions Per Provider Order: Yes RLE Weight Bearing Per Provider Order: Weight bearing as tolerated       Mobility Bed Mobility               General bed mobility comments: NT Pt up in recliner throughout session    Transfers Overall transfer level: Needs assistance Equipment used: Rolling walker (2 wheels) Transfers: Sit to/from Stand Sit to Stand: Modified independent (Device/Increase time)           General  transfer comment: MOD I/SUP for STS from recliner and SUP for mobility 280 feet using RW     Balance Overall balance assessment: Independent Sitting-balance support: Feet supported Sitting balance-Leahy Scale: Good     Standing balance support: During functional activity, Reliant on assistive device for balance, Single extremity supported Standing balance-Leahy Scale: Good                             ADL either performed or assessed with clinical judgement   ADL Overall ADL's : Needs assistance/impaired                                       General ADL Comments: educated on use of AE/AD for ADL management using reacher, sockaide, shoe horn, long handled sponge    Extremity/Trunk Assessment              Vision       Perception     Praxis     Communication Communication Communication: No apparent difficulties   Cognition Arousal: Alert Behavior During Therapy: WFL for tasks assessed/performed                                 Following commands: Intact        Cueing      Exercises      Shoulder Instructions  General Comments 89% on RA after 280 feet of mobility, but improved with rest to [name]    Pertinent Vitals/ Pain       Pain Assessment Pain Assessment: 0-10 Pain Score: 7  Pain Location: R hip Pain Descriptors / Indicators: Discomfort Pain Intervention(s): Monitored during session, Limited activity within patient's tolerance, Repositioned  Home Living                                          Prior Functioning/Environment              Frequency  Min 3X/week        Progress Toward Goals  OT Goals(current goals can now be found in the care plan section)  Progress towards OT goals: Progressing toward goals  Acute Rehab OT Goals Patient Stated Goal: return home OT Goal Formulation: With patient Time For Goal Achievement: 06/13/23 Potential to Achieve Goals: Good  Plan       Co-evaluation                 AM-PAC OT "6 Clicks" Daily Activity     Outcome Measure   Help from another person eating meals?: None Help from another person taking care of personal grooming?: A Little Help from another person toileting, which includes using toliet, bedpan, or urinal?: A Little Help from another person bathing (including washing, rinsing, drying)?: A Lot Help from another person to put on and taking off regular upper body clothing?: None Help from another person to put on and taking off regular lower body clothing?: A Lot 6 Click Score: 18    End of Session Equipment Utilized During Treatment: Gait belt;Rolling walker (2 wheels)  OT Visit Diagnosis: Unsteadiness on feet (R26.81);Other abnormalities of gait and mobility (R26.89);History of falling (Z91.81);Muscle weakness (generalized) (M62.81)   Activity Tolerance Patient tolerated treatment well   Patient Left in chair;with call bell/phone within reach   Nurse Communication Mobility status        Time: 1914-7829 OT Time Calculation (min): 20 min  Charges: OT General Charges $OT Visit: 1 Visit OT Treatments $Therapeutic Activity: 8-22 mins  Jimmy Bishop, OTR/L  05/31/23, 4:04 PM   Jimmy Bishop E Jimmy Bishop 05/31/2023, 4:03 PM

## 2023-05-31 NOTE — Progress Notes (Signed)
 Progress Note   Patient: Jimmy Bishop WGN:[id number] DOB: 04/16/1960 DOA: 05/29/2023     2 DOS: the patient was seen and examined on 05/31/2023   Brief hospital course: Jimmy Bishop is a 63 y.o. male with medical history significant of HTN, HLD, DM, COPD, tobacco abuse, anemia, s/p of recent right hip replacement on 3/5 (on Eliquis), who presents with SOB.  Patient chest x-ray showed right upper lobe infiltration, CT negative for PE, showed right upper lobe and superior segment right lower lobe pneumonia, emphysema admitted to hospitalist service for IV antibiotic therapy and further management.  Assessment and Plan: HCAP Hypoxia Gram negative bacteremia- Continue supplemental oxygen as needed to maintain saturation greater than 92%. Continue bronchodilator therapy, Mucinex for cough. Solu-Medrol 80 mg daily. Stop IV vancomycin as blood cultures positive for gram-negative rods. Continue cefepime therapy.  Lower extremity edema BNP 66, last echo preserved ejection fraction. Will hold norvasc due to his edema. Encourage leg elevation. Trial of Lasix 20mg  ordered.  Type 2 diabetes- A1c 5.9 two months ago. Continue accucheks, sliding scale insulin,  Status post right hip replacement Wound site not seem to be infected. Orthopedic follow up appreciated. Continue Tylenol, oxycodone for pain. Continue home dose Eliquis for DVT prophylaxis. PT OT evaluation for ambulation.  COPD exacerbation- Continue bronchodilators as needed.  IV steroids, Mucinex as above.  Hypertension: Blood pressure stable. Stopped amlodipine, continue Lisinopril home dose.  IV hydralazine as needed.  Hyperlipidemia-continue pravastatin.  Obesity with BMI 32.64- Encourage diet, exercise and weight reduction.  Tobacco abuse- Discussed ill effects of tobacco. Advised nicotine patches.     Out of bed to chair. Incentive spirometry. Nursing supportive care. Fall, aspiration precautions. DVT  prophylaxis   Code Status: Full Code  Subjective: Patient is seen and examined today morning.  He is short of breath with mild exertion. Eating fair. Saturation better on 2 L supplemental oxygen.  Physical Exam: Vitals:   05/30/23 2012 05/31/23 0035 05/31/23 0746 05/31/23 0908  BP:  133/73  134/70  Pulse:  81  93  Resp:    20  Temp:  98.1 F (36.7 C)  (!) 97.5 F (36.4 C)  TempSrc:  Oral  Oral  SpO2: 96% 93% 99% 97%    General - Elderly obese Caucasian male, mild respiratory distress HEENT - PERRLA, EOMI, atraumatic head, non tender sinuses. Lung - Clear, basal rales, right-sided rhonchi, diffuse wheezes. Using accessory muscles. Heart - S1, S2 heard, no murmurs, rubs, 1+ pedal edema. Abdomen - Soft, non tender, obese, bowel sounds good Neuro - Alert, awake and oriented x 3, non focal exam. Skin - Warm and dry.  Data Reviewed:      Latest Ref Rng & Units 05/31/2023    5:47 AM 05/30/2023    4:35 AM 05/29/2023    6:02 PM  CBC  WBC 4.0 - 10.5 K/uL 10.4  11.3  12.6   Hemoglobin 13.0 - 17.0 g/dL 78.4  69.6  29.5   Hematocrit 39.0 - 52.0 % 38.3  39.6  42.8   Platelets 150 - 400 K/uL 414  421  463       Latest Ref Rng & Units 05/31/2023    5:47 AM 05/30/2023    4:35 AM 05/29/2023    6:02 PM  BMP  Glucose 70 - 99 mg/dL 284  132  440   BUN 8 - 23 mg/dL 27  28  27    Creatinine 0.61 - 1.24 mg/dL 1.02  7.25  3.66  Sodium 135 - 145 mmol/L 132  135  135   Potassium 3.5 - 5.1 mmol/L 5.2  5.5  4.2   Chloride 98 - 111 mmol/L 98  101  103   CO2 22 - 32 mmol/L 28  25  22    Calcium 8.9 - 10.3 mg/dL 9.4  9.2  9.6    US Venous Img Lower Bilateral (DVT) Result Date: 05/29/2023 CLINICAL DATA:  Leg edema EXAM: BILATERAL LOWER EXTREMITY VENOUS DOPPLER ULTRASOUND TECHNIQUE: Gray-scale sonography with graded compression, as well as color Doppler and duplex ultrasound were performed to evaluate the lower extremity deep venous systems from the level of the common femoral vein and including the  common femoral, femoral, profunda femoral, popliteal and calf veins including the posterior tibial, peroneal and gastrocnemius veins when visible. The superficial great saphenous vein was also interrogated. Spectral Doppler was utilized to evaluate flow at rest and with distal augmentation maneuvers in the common femoral, femoral and popliteal veins. COMPARISON:  None Available. FINDINGS: RIGHT LOWER EXTREMITY Common Femoral Vein: No evidence of thrombus. Normal compressibility, respiratory phasicity and response to augmentation. Saphenofemoral Junction: No evidence of thrombus. Normal compressibility and flow on color Doppler imaging. Profunda Femoral Vein: No evidence of thrombus. Normal compressibility and flow on color Doppler imaging. Femoral Vein: No evidence of thrombus. Normal compressibility, respiratory phasicity and response to augmentation. Popliteal Vein: No evidence of thrombus. Normal compressibility, respiratory phasicity and response to augmentation. Calf Veins: No evidence of thrombus. Normal compressibility and flow on color Doppler imaging. LEFT LOWER EXTREMITY Common Femoral Vein: No evidence of thrombus. Normal compressibility, respiratory phasicity and response to augmentation. Saphenofemoral Junction: No evidence of thrombus. Normal compressibility and flow on color Doppler imaging. Profunda Femoral Vein: No evidence of thrombus. Normal compressibility and flow on color Doppler imaging. Femoral Vein: No evidence of thrombus. Normal compressibility, respiratory phasicity and response to augmentation. Popliteal Vein: No evidence of thrombus. Normal compressibility, respiratory phasicity and response to augmentation. Calf Veins: No evidence of thrombus. Normal compressibility and flow on color Doppler imaging. IMPRESSION: No evidence of deep venous thrombosis in either lower extremity. Electronically Signed   By: Feliberto Harts M.D.   On: 05/29/2023 23:46   CT Angio Chest PE W and/or Wo  Contrast Result Date: 05/29/2023 CLINICAL DATA:  Shortness of breath and chest pain with right upper lobe opacity on portable chest today. Coughing and right lower extremity edema as well. EXAM: CT ANGIOGRAPHY CHEST WITH CONTRAST TECHNIQUE: Multidetector CT imaging of the chest was performed using the standard protocol during bolus administration of intravenous contrast. Multiplanar CT image reconstructions and MIPs were obtained to evaluate the vascular anatomy. RADIATION DOSE REDUCTION: This exam was performed according to the departmental dose-optimization program which includes automated exposure control, adjustment of the mA and/or kV according to patient size and/or use of iterative reconstruction technique. CONTRAST:  75mL OMNIPAQUE IOHEXOL 350 MG/ML SOLN COMPARISON:  Portable chest today, AP Lat chest 02/23/2023, portable chest 09/09/2020, and CT abdomen and pelvis with contrast 06/25/2014. No prior chest CT. FINDINGS: Cardiovascular: There is mild cardiomegaly with a left chamber predominance. The pulmonary veins are nondistended. There is no pericardial effusion. There are patchy left main and 2 vessel LAD and right coronary artery calcifications, moderate calcification in the aorta and great vessels and no aneurysm, stenosis or dissection. The pulmonary trunk measures prominent at 3.3 cm consistent with arterial hypertension but no arterial embolus or acute right heart strain findings are seen. Mediastinum/Nodes: There are scattered shotty subcentimeter in short  axis mediastinal and hilar nodes no enlarged intrathoracic or axillary nodes. Thyroid gland is unremarkable. The thoracic trachea and main bronchi are patent. The thoracic esophagus unremarkable. Lungs/Pleura: There are paraseptal and centrilobular emphysematous changes in the upper lobes. There are patchy ground-glass opacities in the right upper lobe with underlying interstitial thickening, findings consistent with right upper lobe pneumonia.  Small amount of similar disease in the superior segment of the right lower lobe. There is diffuse bronchial thickening. Scattered subsegmental bronchial small airway plugging in the lower lobes. There is posterior atelectasis in both lungs, mild subpleural reticulation in the lower lobes consistent with chronic change. No interstitial edema or pleural effusions are seen. The lungs are otherwise clear with mild elevation right hemidiaphragm. Upper Abdomen: The liver is mildly steatotic. Portions of the left lobe appear to show subtle capsular nodularity suspicious for early cirrhosis. There is a 1.8 cm rim calcified stone in the proximal gallbladder but no wall thickening or bile duct dilatation. There is abdominal aortic atherosclerosis. Calcified granuloma in the liver dome. Musculoskeletal: Degenerative change and mild dextroscoliosis thoracic spine. There is a dorsal fusion construct in the lower cervical spine ending at T1, evidence of C6 and C7 decompression laminectomies. There is mild anterior wedging and interbody ankylosis T10-11, mild wedging of the T7 vertebral body, all of which appear chronic. Multilevel healed fracture deformities of the ribs. No acute or other significant osseous findings. No chest wall mass is seen. Review of the MIP images confirms the above findings. IMPRESSION: 1. No evidence of arterial embolus or acute right heart strain. 2. Right upper lobe and superior segment right lower lobe pneumonia. 3. Emphysema. 4. Bronchitis with scattered subsegmental small airway plugging in the lower lobes. 5. Cardiomegaly with aortic and coronary artery atherosclerosis. 6. Prominent pulmonary trunk at 3.3 cm consistent with arterial hypertension. 7. Mild hepatic steatosis with subtle capsular nodularity in the left lobe suspicious for early cirrhosis. No splenomegaly or upper abdominal ascites. 8. Cholelithiasis. 9. Chronic appearing wedging of the T7 and T10-11 vertebral bodies, interbody ankylosis  T10-11. Aortic Atherosclerosis (ICD10-I70.0) and Emphysema (ICD10-J43.9). Electronically Signed   By: Almira Bar M.D.   On: 05/29/2023 21:31   DG Chest Portable 1 View Result Date: 05/29/2023 CLINICAL DATA:  Chest pain EXAM: PORTABLE CHEST 1 VIEW COMPARISON:  Chest radiograph 02/23/2023 FINDINGS: Monitoring leads overlie the patient. Stable cardiac and mediastinal contours. Minimal atelectasis lower lung bases bilaterally. Focal consolidation right upper lung. Chronic posttraumatic deformity left ribs. IMPRESSION: Focal consolidation right upper lung concerning for pneumonia. Recommend follow-up chest radiograph in 6-8 weeks to ensure resolution. Electronically Signed   By: Annia Belt M.D.   On: 05/29/2023 18:42    Family Communication: Discussed with patient, he understand and agree. All questions answered.  Disposition: Status is: Inpatient Remains inpatient appropriate because: hypoxia, COPD, pneumonia on IV antibiotics, oxygen  Planned Discharge Destination: Home with Home Health     Time spent: 40 minutes  Author: Marcelino Duster, MD 05/31/2023 12:40 PM Secure chat 7am to 7pm For on call review [email or web address].

## 2023-05-31 NOTE — Plan of Care (Signed)
  Problem: Education: Goal: Ability to describe self-care measures that may prevent or decrease complications (Diabetes Survival Skills Education) will improve Outcome: Progressing   Problem: Coping: Goal: Ability to adjust to condition or change in health will improve Outcome: Progressing   Problem: Metabolic: Goal: Ability to maintain appropriate glucose levels will improve Outcome: Progressing   Problem: Tissue Perfusion: Goal: Adequacy of tissue perfusion will improve Outcome: Progressing

## 2023-06-01 DIAGNOSIS — I10 Essential (primary) hypertension: Secondary | ICD-10-CM | POA: Diagnosis not present

## 2023-06-01 DIAGNOSIS — J449 Chronic obstructive pulmonary disease, unspecified: Secondary | ICD-10-CM | POA: Diagnosis not present

## 2023-06-01 DIAGNOSIS — E669 Obesity, unspecified: Secondary | ICD-10-CM | POA: Diagnosis not present

## 2023-06-01 DIAGNOSIS — Z96641 Presence of right artificial hip joint: Secondary | ICD-10-CM | POA: Diagnosis not present

## 2023-06-01 DIAGNOSIS — J189 Pneumonia, unspecified organism: Secondary | ICD-10-CM | POA: Diagnosis not present

## 2023-06-01 DIAGNOSIS — R600 Localized edema: Secondary | ICD-10-CM | POA: Diagnosis not present

## 2023-06-01 DIAGNOSIS — Z720 Tobacco use: Secondary | ICD-10-CM | POA: Diagnosis not present

## 2023-06-01 DIAGNOSIS — E119 Type 2 diabetes mellitus without complications: Secondary | ICD-10-CM | POA: Diagnosis not present

## 2023-06-01 LAB — CBC
HCT: 38.8 % — ABNORMAL LOW (ref 39.0–52.0)
Hemoglobin: 12.8 g/dL — ABNORMAL LOW (ref 13.0–17.0)
MCH: 29.9 pg (ref 26.0–34.0)
MCHC: 33 g/dL (ref 30.0–36.0)
MCV: 90.7 fL (ref 80.0–100.0)
Platelets: 425 10*3/uL — ABNORMAL HIGH (ref 150–400)
RBC: 4.28 MIL/uL (ref 4.22–5.81)
RDW: 13.5 % (ref 11.5–15.5)
WBC: 10.6 10*3/uL — ABNORMAL HIGH (ref 4.0–10.5)
nRBC: 0 % (ref 0.0–0.2)

## 2023-06-01 LAB — GLUCOSE, CAPILLARY
Glucose-Capillary: 150 mg/dL — ABNORMAL HIGH (ref 70–99)
Glucose-Capillary: 182 mg/dL — ABNORMAL HIGH (ref 70–99)
Glucose-Capillary: 191 mg/dL — ABNORMAL HIGH (ref 70–99)
Glucose-Capillary: 181 mg/dL — ABNORMAL HIGH (ref 70–99)

## 2023-06-01 LAB — BASIC METABOLIC PANEL
Anion gap: 9 (ref 5–15)
BUN: 30 mg/dL — ABNORMAL HIGH (ref 8–23)
CO2: 25 mmol/L (ref 22–32)
Calcium: 9.2 mg/dL (ref 8.9–10.3)
Chloride: 97 mmol/L — ABNORMAL LOW (ref 98–111)
Creatinine, Ser: 0.79 mg/dL (ref 0.61–1.24)
GFR, Estimated: >60 mL/min (ref >60)
Glucose, Bld: 202 mg/dL — ABNORMAL HIGH (ref 70–99)
Potassium: 4.7 mmol/L (ref 3.5–5.1)
Sodium: 131 mmol/L — ABNORMAL LOW (ref 135–145)

## 2023-06-01 MED ORDER — PREDNISONE 50 MG PO TABS
60.0000 mg | ORAL_TABLET | Freq: Every day | ORAL | Status: AC
  Administered 2023-06-01 – 2023-06-02 (×2): 60 mg via ORAL
  Filled 2023-06-01 (×2): qty 1

## 2023-06-01 MED ORDER — PREDNISONE 10 MG PO TABS: 60.0000 mg | ORAL_TABLET | Freq: Every day | ORAL | Status: DC

## 2023-06-01 NOTE — Progress Notes (Signed)
 Patient is s/p a right total hip arthroplasty performed by Dr. Joice Lofts on 05/24/23.  Re-admitted due to HCAP, no signs of infection to the right but but has continued to have moderate serosanguinous drainage from the right right hip incision.  Wound vac was applied to the right hip today without issue.  After application of the foam, seal check was performed which demonstrated a good seal, extra tegaderm was applied as a precaution as well.  Patient will go home with portable wound vac.  He is scheduled to follow-up with me next week.  Will plan on removing the wound vac at this time and removing staples as well.    Valeria Batman, PA-C, CAQ-OS [facility]

## 2023-06-01 NOTE — Progress Notes (Signed)
 PT Cancellation Note  Patient Details Name: KATLIN CISZEWSKI MRN: [id number] DOB: April 28, 1960   Cancelled Treatment:     PT attempt. 2nd attempt today. On both attempts, pt had just been up ambulating in the hallway. 1st attempt, pt ambulating with RN staff, 2nd attempt pt had just ambulated with OT. Acute PT will continue to follow per current POC.     Rushie Chestnut 06/01/2023, 2:33 PM

## 2023-06-01 NOTE — Progress Notes (Signed)
 Occupational Therapy Treatment Patient Details Name: Jimmy Bishop MRN: [id number] DOB: 29-Feb-1961 Today's Date: 06/01/2023   History of present illness Pt is a 63 y/o M admitted on 05/29/23 after presenting with c/o SOB x 2 weeks, cough. CTA showed RUL & RLL PNA. PMH: HTN, HLD, DM, COPD, tobacco abuse, anemia, recent R hip replacement 3/5   OT comments  Pt up in chair upon OT arrival, reporting recent placement of wound vac to R hip.  Pt agreeable to OT session.  Pt participated in functional mobility x2 laps around nurses station with min guard and RW, working to build strength and activity tolerance for ADLs and household mobility.  OT reinforced EC strategies throughout, with pt showing good implementation of pacing strategies.  Review of hip precautions and AE; see below for details.  02 sats WFL on room air throughout session.  Will continue to follow to work towards goals in OT poc to maximize indep with ADLs and functional mobility.       If plan is discharge home, recommend the following:  A little help with walking and/or transfers;A lot of help with bathing/dressing/bathroom;Assistance with cooking/housework;Help with stairs or ramp for entrance;Assist for transportation   Equipment Recommendations  BSC/3in1;Other (comment) (long handled equipment (hip kit))    Recommendations for Other Services      Precautions / Restrictions Precautions Precautions: Fall;Posterior Hip Precaution Booklet Issued: Yes (comment) Recall of Precautions/Restrictions: Intact Precaution/Restrictions Comments: 2/3 recalled Restrictions Weight Bearing Restrictions Per Provider Order: Yes RLE Weight Bearing Per Provider Order: Weight bearing as tolerated       Mobility Bed Mobility               General bed mobility comments: NT; received and left pt sitting up in recliner Patient Response: Cooperative  Transfers Overall transfer level: Needs assistance Equipment used: Rolling walker  (2 wheels) Transfers: Sit to/from Stand Sit to Stand: Modified independent (Device/Increase time)     Step pivot transfers: Modified independent (Device/Increase time)           Balance Overall balance assessment: Independent Sitting-balance support: Feet supported Sitting balance-Leahy Scale: Good     Standing balance support: During functional activity, Reliant on assistive device for balance, Single extremity supported Standing balance-Leahy Scale: Good                 High Level Balance Comments: Demonstrated controlled side stepping and maneuvering walker through narrow space between bed and countertop within bedroom.           ADL either performed or assessed with clinical judgement   ADL Overall ADL's : Needs assistance/impaired                     Lower Body Dressing: Moderate assistance Lower Body Dressing Details (indicate cue type and reason): Family currently manages d/t not yet obtaining AE. Pt can manage clothing once pulled to knee level.  Pt reports his dad is planning to obtain reacher today.             Functional mobility during ADLs: Rolling walker (2 wheels);Contact guard assist General ADL Comments: Reviewed hip precautions and use of AE/AD for ADL management using reacher, sockaid.  Min vc for pushing RW rather than picking up walker with each step.    Extremity/Trunk Assessment Upper Extremity Assessment Upper Extremity Assessment: Overall WFL for tasks assessed   Lower Extremity Assessment Lower Extremity Assessment: RLE deficits/detail;Generalized weakness RLE Deficits / Details: recent R THA  Vision       Perception     Praxis     Communication Communication Communication: No apparent difficulties   Cognition Arousal: Alert Behavior During Therapy: WFL for tasks assessed/performed Cognition: No apparent impairments             OT - Cognition Comments: A and O x4; recalled 3/3 posterior hip  precautions                 Following commands: Intact        Cueing   Cueing Techniques: Verbal cues  Exercises Other Exercises Other Exercises: Educ on EC strategies to improve tolerance for ADL/IADLs    Shoulder Instructions       General Comments 02 sats maintained in mid-high 90s throughout session on room air.    Pertinent Vitals/ Pain       Pain Assessment Pain Assessment: PAINAD Pain Score: 8  Pain Location: R hip; pt reports wound vac placed just about 30 min prior to OT session Pain Descriptors / Indicators: Discomfort, Sharp Pain Intervention(s): Monitored during session, Patient requesting pain meds-RN notified  Home Living                                          Prior Functioning/Environment              Frequency  Min 3X/week        Progress Toward Goals  OT Goals(current goals can now be found in the care plan section)  Progress towards OT goals: Progressing toward goals  Acute Rehab OT Goals Patient Stated Goal: return home OT Goal Formulation: With patient Time For Goal Achievement: 06/13/23 Potential to Achieve Goals: Good  Plan      Co-evaluation                 AM-PAC OT "6 Clicks" Daily Activity     Outcome Measure   Help from another person eating meals?: None Help from another person taking care of personal grooming?: None Help from another person toileting, which includes using toliet, bedpan, or urinal?: A Little Help from another person bathing (including washing, rinsing, drying)?: A Lot Help from another person to put on and taking off regular upper body clothing?: None Help from another person to put on and taking off regular lower body clothing?: A Lot 6 Click Score: 19    End of Session Equipment Utilized During Treatment: Gait belt;Rolling walker (2 wheels)  OT Visit Diagnosis: Unsteadiness on feet (R26.81);Other abnormalities of gait and mobility (R26.89);History of falling  (Z91.81);Muscle weakness (generalized) (M62.81)   Activity Tolerance Patient tolerated treatment well   Patient Left in chair;with call bell/phone within reach   Nurse Communication Patient requests pain meds        Time: 1350-1411 OT Time Calculation (min): 21 min  Charges: OT General Charges $OT Visit: 1 Visit OT Treatments $Self Care/Home Management : 8-22 mins  Danelle Earthly, MS, OTR/L   Otis Dials 06/01/2023, 2:28 PM

## 2023-06-01 NOTE — Progress Notes (Addendum)
 Progress Note   Patient: Jimmy Bishop ZOX:[id number] DOB: 07/09/1960 DOA: 05/29/2023     3 DOS: the patient was seen and examined on 06/01/2023   Brief hospital course: Jimmy Bishop is a 63 y.o. male with medical history significant of HTN, HLD, DM, COPD, tobacco abuse, anemia, s/p of recent right hip replacement on 3/5 (on Eliquis), who presents with SOB.  Patient chest x-ray showed right upper lobe infiltration, CT negative for PE, showed right upper lobe and superior segment right lower lobe pneumonia, emphysema admitted to hospitalist service for IV antibiotic therapy and further management.  Assessment and Plan: HCAP Hypoxia Gram negative bacteremia- Continue supplemental oxygen as needed to maintain saturation greater than 92%. Continue bronchodilator therapy, Mucinex for cough. Solu-Medrol 80 mg changed to oral prednisone. Blood cultures positive for gram-negative rods.  Repeat blood cultures ordered.  Continue cefepime therapy with plan to send him on Cipro for 7 days.  Lower extremity edema BNP 66, last echo preserved ejection fraction. Will hold norvasc due to his edema. Encourage leg elevation. Trial of Lasix 20mg  ordered.  Type 2 diabetes- Continue accucheks, sliding scale insulin,  Status post right hip replacement Wound site not seem to be infected. Serosanguinous drainage noted. Orthopedic follow up appreciated, today wound vac applied. Portable wound VAC prior to discharge. Continue Tylenol, oxycodone for pain. Continue home dose Eliquis for DVT prophylaxis. PT OT evaluation for ambulation.  COPD exacerbation- Continue bronchodilators as needed.  IV steroids, Mucinex as above.  Hypertension: Blood pressure stable. Stopped amlodipine, continue Lisinopril home dose.  IV hydralazine as needed.  Hyperlipidemia-continue pravastatin.  Obesity with BMI 32.64- Encourage diet, exercise and weight reduction.  Tobacco abuse- Discussed ill effects of  tobacco. Advised nicotine patches.     Out of bed to chair. Incentive spirometry. Nursing supportive care. Fall, aspiration precautions. DVT prophylaxis   Code Status: Full Code  Subjective: Patient is seen and examined today morning.  His short of breath better today. Eating fair. Ambulating with PT. Saturation better off 2 L supplemental oxygen.  Physical Exam: Vitals:   05/31/23 2100 05/31/23 2117 06/01/23 0823 06/01/23 0853  BP:  131/68 121/66   Pulse:  78 74   Resp:  17 16   Temp:  97.8 F (36.6 C)    TempSrc:      SpO2: 93% 98% 96% 97%    General - Elderly obese Caucasian male, mild respiratory distress HEENT - PERRLA, EOMI, atraumatic head, non tender sinuses. Lung - Clear, basal rales, right-sided rhonchi, diffuse wheezes. Using accessory muscles. Heart - S1, S2 heard, no murmurs, rubs, 1+ pedal edema. Abdomen - Soft, non tender, obese, bowel sounds good Neuro - Alert, awake and oriented x 3, non focal exam. Skin - Warm and dry. Right hip wound vac.  Data Reviewed:      Latest Ref Rng & Units 06/01/2023    4:24 AM 05/31/2023    5:47 AM 05/30/2023    4:35 AM  CBC  WBC 4.0 - 10.5 K/uL 10.6  10.4  11.3   Hemoglobin 13.0 - 17.0 g/dL 81.1  91.4  78.2   Hematocrit 39.0 - 52.0 % 38.8  38.3  39.6   Platelets 150 - 400 K/uL 425  414  421       Latest Ref Rng & Units 06/01/2023    4:24 AM 05/31/2023    5:47 AM 05/30/2023    4:35 AM  BMP  Glucose 70 - 99 mg/dL 956  213  086  BUN 8 - 23 mg/dL 30  27  28    Creatinine 0.61 - 1.24 mg/dL 4.09  8.11  9.14   Sodium 135 - 145 mmol/L 131  132  135   Potassium 3.5 - 5.1 mmol/L 4.7  5.2  5.5   Chloride 98 - 111 mmol/L 97  98  101   CO2 22 - 32 mmol/L 25  28  25    Calcium 8.9 - 10.3 mg/dL 9.2  9.4  9.2    No results found.   Family Communication: Discussed with patient, he understand and agree. All questions answered.  Disposition: Status is: Inpatient Remains inpatient appropriate because: hypoxia, COPD, pneumonia  on IV antibiotics, oxygen  Planned Discharge Destination: Home with Home Health Portable wound VAC to be arranged by Ortho.    Time spent: 40 minutes  Author: Marcelino Duster, MD 06/01/2023 3:46 PM Secure chat 7am to 7pm For on call review [email or web address].

## 2023-06-01 NOTE — Plan of Care (Signed)
  Problem: Education: Goal: Ability to describe self-care measures that may prevent or decrease complications (Diabetes Survival Skills Education) will improve Outcome: Progressing   Problem: Coping: Goal: Ability to adjust to condition or change in health will improve Outcome: Progressing   Problem: Nutritional: Goal: Progress toward achieving an optimal weight will improve Outcome: Progressing   Problem: Activity: Goal: Will verbalize the importance of balancing activity with adequate rest periods Outcome: Progressing

## 2023-06-02 ENCOUNTER — Other Ambulatory Visit: Payer: Self-pay

## 2023-06-02 DIAGNOSIS — E785 Hyperlipidemia, unspecified: Secondary | ICD-10-CM | POA: Diagnosis not present

## 2023-06-02 DIAGNOSIS — J441 Chronic obstructive pulmonary disease with (acute) exacerbation: Secondary | ICD-10-CM | POA: Diagnosis not present

## 2023-06-02 DIAGNOSIS — I10 Essential (primary) hypertension: Secondary | ICD-10-CM | POA: Diagnosis not present

## 2023-06-02 DIAGNOSIS — J189 Pneumonia, unspecified organism: Secondary | ICD-10-CM | POA: Diagnosis not present

## 2023-06-02 DIAGNOSIS — E119 Type 2 diabetes mellitus without complications: Secondary | ICD-10-CM | POA: Diagnosis not present

## 2023-06-02 DIAGNOSIS — Z96641 Presence of right artificial hip joint: Secondary | ICD-10-CM | POA: Diagnosis not present

## 2023-06-02 DIAGNOSIS — Z720 Tobacco use: Secondary | ICD-10-CM | POA: Diagnosis not present

## 2023-06-02 DIAGNOSIS — E669 Obesity, unspecified: Secondary | ICD-10-CM | POA: Diagnosis not present

## 2023-06-02 DIAGNOSIS — R600 Localized edema: Secondary | ICD-10-CM | POA: Diagnosis not present

## 2023-06-02 LAB — CULTURE, BLOOD (ROUTINE X 2)

## 2023-06-02 LAB — LEGIONELLA PNEUMOPHILA SEROGP 1 UR AG: L. pneumophila Serogp 1 Ur Ag: NEGATIVE

## 2023-06-02 LAB — GLUCOSE, CAPILLARY
Glucose-Capillary: 178 mg/dL — ABNORMAL HIGH (ref 70–99)
Glucose-Capillary: 138 mg/dL — ABNORMAL HIGH (ref 70–99)

## 2023-06-02 MED ORDER — CIPROFLOXACIN HCL 500 MG PO TABS
500.0000 mg | ORAL_TABLET | Freq: Two times a day (BID) | ORAL | 0 refills | Status: AC
  Filled 2023-06-02: qty 14, 7d supply, fill #0

## 2023-06-02 MED ORDER — PREDNISONE 20 MG PO TABS
ORAL_TABLET | ORAL | 0 refills | Status: AC
  Filled 2023-06-02: qty 6, 4d supply, fill #0

## 2023-06-02 MED ORDER — OXYCODONE HCL 5 MG PO TABS
5.0000 mg | ORAL_TABLET | Freq: Four times a day (QID) | ORAL | 0 refills | Status: AC | PRN
  Filled 2023-06-02: qty 20, 3d supply, fill #0

## 2023-06-02 MED ORDER — OXYCODONE HCL 5 MG PO TABS
5.0000 mg | ORAL_TABLET | Freq: Four times a day (QID) | ORAL | 0 refills | Status: DC | PRN
Start: 2023-06-02 — End: 2023-06-02

## 2023-06-02 MED ORDER — OXYCODONE HCL 5 MG PO TABS: 5.0000 mg | ORAL_TABLET | Freq: Four times a day (QID) | ORAL | 0 refills | Status: DC | PRN

## 2023-06-02 NOTE — Discharge Summary (Signed)
 Physician Discharge Summary   Patient: Jimmy Bishop MRN: [id number] DOB: [birth year]  Admit date:     05/29/2023  Discharge date: 06/02/23  Discharge Physician: Marcelino Duster   PCP: Sherlyn Hay, DO   Recommendations at discharge:    PCP follow up in 1 week.  Discharge Diagnoses: Principal Problem:   HCAP (healthcare-associated pneumonia) Active Problems:   COPD (chronic obstructive pulmonary disease) (HCC)   Hypertension   Lower extremity edema   Diabetes mellitus without complication (HCC)   HLD (hyperlipidemia)   Status post total hip replacement, right   Tobacco abuse   Obesity (BMI 30-39.9)  Resolved Problems:   * No resolved hospital problems. [name] Course: Jimmy Bishop is a 63 y.o. male with medical history significant of HTN, HLD, DM, COPD, tobacco abuse, anemia, s/p of recent right hip replacement on 3/5 (on Eliquis), who presents with SOB.  Patient chest x-ray showed right upper lobe infiltration, CT negative for PE, showed right upper lobe and superior segment right lower lobe pneumonia, emphysema admitted to hospitalist service for IV antibiotic therapy and further management.   Assessment and Plan: HCAP Hypoxia COPD exacerbation- Gram negative bacteremia- He is off supplemental o2. Continue bronchodilator therapy, Mucinex for cough. Oral taper of prednisone prescribed. Blood cultures positive for gram-negative rods.  Repeat blood cultures pending so far.  He got cefepime therapy inpatient, will give Cipro for 7 days. Advised to follow with PCP and orthopedic surgery as scheduled.   Lower extremity edema BNP 66, last echo preserved ejection fraction. Encourage leg elevation. Outpatient PCP follow up.   Type 2 diabetes- Continue accucheks, sliding scale insulin,   Status post right hip replacement Wound site not seem to be infected. Serosanguinous drainage noted. Orthopedic follow up appreciated, Prevena wound vac  applied. Continue home dose Eliquis for DVT prophylaxis. Outpatient ortho follow up as scheduled.   Hypertension: Blood pressure stable. Continue home norvasc, Lisinopril.   Hyperlipidemia-continue pravastatin.   Obesity with BMI 32.64- Encourage diet, exercise and weight reduction.   Tobacco abuse- Discussed ill effects of tobacco. Advised nicotine patches.        Consultants: none Procedures performed: none  Disposition: Home Diet recommendation:  Discharge Diet Orders (From admission, onward)     Start     Ordered   06/02/23 0000  Diet - low sodium heart healthy        06/02/23 1025           Cardiac and Carb modified diet DISCHARGE MEDICATION: Allergies as of 06/02/2023       Reactions   Vicodin [hydrocodone-acetaminophen] Hives, Rash        Medication List     TAKE these medications    acetaminophen 500 MG tablet Commonly known as: TYLENOL Take 2 tablets (1,000 mg total) by mouth every 6 (six) hours.   amLODipine 10 MG tablet Commonly known as: NORVASC Take 10 mg by mouth daily.   apixaban 2.5 MG Tabs tablet Commonly known as: ELIQUIS Take 1 tablet (2.5 mg total) by mouth 2 (two) times daily.   Blood Glucose Monitoring Suppl Devi 1 each by Does not apply route daily before breakfast. May substitute to any manufacturer covered by patient's insurance.   BLOOD GLUCOSE TEST STRIPS Strp 1 each by In Vitro route daily before breakfast. May substitute to any manufacturer covered by patient's insurance.   cetirizine 10 MG tablet Commonly known as: ZYRTEC Take 1 tablet (10 mg total) by mouth daily.   ciprofloxacin 500  MG tablet Commonly known as: Cipro Take 1 tablet (500 mg total) by mouth 2 (two) times daily for 7 days.   gabapentin 600 MG tablet Commonly known as: NEURONTIN Take 1 tablet (600 mg total) by mouth 3 (three) times daily.   Lancet Device Misc 1 each by Does not apply route daily before breakfast. May substitute to any  manufacturer covered by patient's insurance.   Lancets Misc. Misc 1 each by Does not apply route daily before breakfast. May substitute to any manufacturer covered by patient's insurance.   lisinopril 20 MG tablet Commonly known as: ZESTRIL Take 20 mg by mouth daily.   metFORMIN 500 MG tablet Commonly known as: GLUCOPHAGE Take 1 tablet (500 mg total) by mouth 2 (two) times daily with a meal.   ondansetron 4 MG tablet Commonly known as: ZOFRAN Take 1 tablet (4 mg total) by mouth every 6 (six) hours as needed for nausea.   oxyCODONE 5 MG immediate release tablet Commonly known as: Oxy IR/ROXICODONE Take 1-2 tablets (5-10 mg total) by mouth every 6 (six) hours as needed for up to 5 days for moderate pain (pain score 4-6). What changed: when to take this   pravastatin 40 MG tablet Commonly known as: PRAVACHOL Take 40 mg by mouth daily.   predniSONE 20 MG tablet Commonly known as: DELTASONE Take 2 tablets (40 mg total) by mouth daily with breakfast for 2 days, THEN 1 tablet (20 mg total) daily with breakfast for 2 days. Start taking on: June 02, 2023   Stiolto Respimat 2.5-2.5 MCG/ACT Aers Generic drug: Tiotropium Bromide-Olodaterol Inhale 2 puffs into the lungs daily.               Discharge Care Instructions  (From admission, onward)           Start     Ordered   06/02/23 0000  Leave dressing on - Keep it clean, dry, and intact until clinic visit        06/02/23 1025            Follow-up Information     Pardue, Monico Blitz, DO Follow up.   Specialty: Family Medicine Why: Hospital follow up Contact information: [address] [place] [address] [place] [address] [phone]                Discharge Exam: There were no vitals filed for this visit.    06/02/2023    7:57 AM 06/02/2023    7:45 AM 06/02/2023    5:32 AM  Vitals with BMI  Systolic 127  116  Diastolic 68  65  Pulse 74 80 70   General - Elderly obese Caucasian male, no respiratory  distress HEENT - PERRLA, EOMI, atraumatic head, non tender sinuses. Lung - Clear, basal rales, rhonchi, diffuse wheezes improved.  Heart - S1, S2 heard, no murmurs, rubs, 1+ pedal edema. Abdomen - Soft, non tender, obese, bowel sounds good Neuro - Alert, awake and oriented x 3, non focal exam. Skin - Warm and dry. Right hip portable wound vac.  Condition at discharge: stable  The results of significant diagnostics from this hospitalization (including imaging, microbiology, ancillary and laboratory) are listed below for reference.   Imaging Studies: US Venous Img Lower Bilateral (DVT) Result Date: 05/29/2023 CLINICAL DATA:  Leg edema EXAM: BILATERAL LOWER EXTREMITY VENOUS DOPPLER ULTRASOUND TECHNIQUE: Gray-scale sonography with graded compression, as well as color Doppler and duplex ultrasound were performed to evaluate the lower extremity deep venous systems from the level  of the common femoral vein and including the common femoral, femoral, profunda femoral, popliteal and calf veins including the posterior tibial, peroneal and gastrocnemius veins when visible. The superficial great saphenous vein was also interrogated. Spectral Doppler was utilized to evaluate flow at rest and with distal augmentation maneuvers in the common femoral, femoral and popliteal veins. COMPARISON:  None Available. FINDINGS: RIGHT LOWER EXTREMITY Common Femoral Vein: No evidence of thrombus. Normal compressibility, respiratory phasicity and response to augmentation. Saphenofemoral Junction: No evidence of thrombus. Normal compressibility and flow on color Doppler imaging. Profunda Femoral Vein: No evidence of thrombus. Normal compressibility and flow on color Doppler imaging. Femoral Vein: No evidence of thrombus. Normal compressibility, respiratory phasicity and response to augmentation. Popliteal Vein: No evidence of thrombus. Normal compressibility, respiratory phasicity and response to augmentation. Calf Veins: No evidence  of thrombus. Normal compressibility and flow on color Doppler imaging. LEFT LOWER EXTREMITY Common Femoral Vein: No evidence of thrombus. Normal compressibility, respiratory phasicity and response to augmentation. Saphenofemoral Junction: No evidence of thrombus. Normal compressibility and flow on color Doppler imaging. Profunda Femoral Vein: No evidence of thrombus. Normal compressibility and flow on color Doppler imaging. Femoral Vein: No evidence of thrombus. Normal compressibility, respiratory phasicity and response to augmentation. Popliteal Vein: No evidence of thrombus. Normal compressibility, respiratory phasicity and response to augmentation. Calf Veins: No evidence of thrombus. Normal compressibility and flow on color Doppler imaging. IMPRESSION: No evidence of deep venous thrombosis in either lower extremity. Electronically Signed   By: Feliberto Harts M.D.   On: 05/29/2023 23:46   CT Angio Chest PE W and/or Wo Contrast Result Date: 05/29/2023 CLINICAL DATA:  Shortness of breath and chest pain with right upper lobe opacity on portable chest today. Coughing and right lower extremity edema as well. EXAM: CT ANGIOGRAPHY CHEST WITH CONTRAST TECHNIQUE: Multidetector CT imaging of the chest was performed using the standard protocol during bolus administration of intravenous contrast. Multiplanar CT image reconstructions and MIPs were obtained to evaluate the vascular anatomy. RADIATION DOSE REDUCTION: This exam was performed according to the departmental dose-optimization program which includes automated exposure control, adjustment of the mA and/or kV according to patient size and/or use of iterative reconstruction technique. CONTRAST:  75mL OMNIPAQUE IOHEXOL 350 MG/ML SOLN COMPARISON:  Portable chest today, AP Lat chest 02/23/2023, portable chest 09/09/2020, and CT abdomen and pelvis with contrast 06/25/2014. No prior chest CT. FINDINGS: Cardiovascular: There is mild cardiomegaly with a left chamber  predominance. The pulmonary veins are nondistended. There is no pericardial effusion. There are patchy left main and 2 vessel LAD and right coronary artery calcifications, moderate calcification in the aorta and great vessels and no aneurysm, stenosis or dissection. The pulmonary trunk measures prominent at 3.3 cm consistent with arterial hypertension but no arterial embolus or acute right heart strain findings are seen. Mediastinum/Nodes: There are scattered shotty subcentimeter in short axis mediastinal and hilar nodes no enlarged intrathoracic or axillary nodes. Thyroid gland is unremarkable. The thoracic trachea and main bronchi are patent. The thoracic esophagus unremarkable. Lungs/Pleura: There are paraseptal and centrilobular emphysematous changes in the upper lobes. There are patchy ground-glass opacities in the right upper lobe with underlying interstitial thickening, findings consistent with right upper lobe pneumonia. Small amount of similar disease in the superior segment of the right lower lobe. There is diffuse bronchial thickening. Scattered subsegmental bronchial small airway plugging in the lower lobes. There is posterior atelectasis in both lungs, mild subpleural reticulation in the lower lobes consistent with chronic change. No interstitial  edema or pleural effusions are seen. The lungs are otherwise clear with mild elevation right hemidiaphragm. Upper Abdomen: The liver is mildly steatotic. Portions of the left lobe appear to show subtle capsular nodularity suspicious for early cirrhosis. There is a 1.8 cm rim calcified stone in the proximal gallbladder but no wall thickening or bile duct dilatation. There is abdominal aortic atherosclerosis. Calcified granuloma in the liver dome. Musculoskeletal: Degenerative change and mild dextroscoliosis thoracic spine. There is a dorsal fusion construct in the lower cervical spine ending at T1, evidence of C6 and C7 decompression laminectomies. There is mild  anterior wedging and interbody ankylosis T10-11, mild wedging of the T7 vertebral body, all of which appear chronic. Multilevel healed fracture deformities of the ribs. No acute or other significant osseous findings. No chest wall mass is seen. Review of the MIP images confirms the above findings. IMPRESSION: 1. No evidence of arterial embolus or acute right heart strain. 2. Right upper lobe and superior segment right lower lobe pneumonia. 3. Emphysema. 4. Bronchitis with scattered subsegmental small airway plugging in the lower lobes. 5. Cardiomegaly with aortic and coronary artery atherosclerosis. 6. Prominent pulmonary trunk at 3.3 cm consistent with arterial hypertension. 7. Mild hepatic steatosis with subtle capsular nodularity in the left lobe suspicious for early cirrhosis. No splenomegaly or upper abdominal ascites. 8. Cholelithiasis. 9. Chronic appearing wedging of the T7 and T10-11 vertebral bodies, interbody ankylosis T10-11. Aortic Atherosclerosis (ICD10-I70.0) and Emphysema (ICD10-J43.9). Electronically Signed   By: Almira Bar M.D.   On: 05/29/2023 21:31   DG Chest Portable 1 View Result Date: 05/29/2023 CLINICAL DATA:  Chest pain EXAM: PORTABLE CHEST 1 VIEW COMPARISON:  Chest radiograph 02/23/2023 FINDINGS: Monitoring leads overlie the patient. Stable cardiac and mediastinal contours. Minimal atelectasis lower lung bases bilaterally. Focal consolidation right upper lung. Chronic posttraumatic deformity left ribs. IMPRESSION: Focal consolidation right upper lung concerning for pneumonia. Recommend follow-up chest radiograph in 6-8 weeks to ensure resolution. Electronically Signed   By: Annia Belt M.D.   On: 05/29/2023 18:42   DG HIP UNILAT W OR W/O PELVIS 2-3 VIEWS RIGHT Result Date: 05/24/2023 CLINICAL DATA:  Status post hip replacement EXAM: DG HIP (WITH OR WITHOUT PELVIS) 2-3V RIGHT COMPARISON:  None Available. FINDINGS: Right hip arthroplasty in expected alignment. No periprosthetic lucency  or fracture. Recent postsurgical change includes air and edema in the soft tissues. Lateral skin staples in place. IMPRESSION: Right hip arthroplasty without immediate postoperative complication. Electronically Signed   By: Narda Rutherford M.D.   On: 05/24/2023 10:29    Microbiology: Results for orders placed or performed during the hospital encounter of 05/29/23  Resp panel by RT-PCR (RSV, Flu A&B, Covid) Anterior Nasal Swab     Status: None   Collection Time: 05/29/23  5:34 PM   Specimen: Anterior Nasal Swab  Result Value Ref Range Status   SARS Coronavirus 2 by RT PCR NEGATIVE NEGATIVE Final    Comment: (NOTE) SARS-CoV-2 target nucleic acids are NOT DETECTED.  The SARS-CoV-2 RNA is generally detectable in upper respiratory specimens during the acute phase of infection. The lowest concentration of SARS-CoV-2 viral copies this assay can detect is 138 copies/mL. A negative result does not preclude SARS-Cov-2 infection and should not be used as the sole basis for treatment or other patient management decisions. A negative result may occur with  improper specimen collection/handling, submission of specimen other than nasopharyngeal swab, presence of viral mutation(s) within the areas targeted by this assay, and inadequate number of viral copies(<138  copies/mL). A negative result must be combined with clinical observations, patient history, and epidemiological information. The expected result is Negative.  Fact Sheet for Patients:  BloggerCourse.com  Fact Sheet for Healthcare Providers:  SeriousBroker.it  This test is no t yet approved or cleared by the [place] FDA and  has been authorized for detection and/or diagnosis of SARS-CoV-2 by FDA under an Emergency Use Authorization (EUA). This EUA will remain  in effect (meaning this test can be used) for the duration of the COVID-19 declaration under Section 564(b)(1) of the Act,  21 U.S.C.section 360bbb-3(b)(1), unless the authorization is terminated  or revoked sooner.       Influenza A by PCR NEGATIVE NEGATIVE Final   Influenza B by PCR NEGATIVE NEGATIVE Final    Comment: (NOTE) The Xpert Xpress SARS-CoV-2/FLU/RSV plus assay is intended as an aid in the diagnosis of influenza from Nasopharyngeal swab specimens and should not be used as a sole basis for treatment. Nasal washings and aspirates are unacceptable for Xpert Xpress SARS-CoV-2/FLU/RSV testing.  Fact Sheet for Patients: BloggerCourse.com  Fact Sheet for Healthcare Providers: SeriousBroker.it  This test is not yet approved or cleared by the [place] FDA and has been authorized for detection and/or diagnosis of SARS-CoV-2 by FDA under an Emergency Use Authorization (EUA). This EUA will remain in effect (meaning this test can be used) for the duration of the COVID-19 declaration under Section 564(b)(1) of the Act, 21 U.S.C. section 360bbb-3(b)(1), unless the authorization is terminated or revoked.     Resp Syncytial Virus by PCR NEGATIVE NEGATIVE Final    Comment: (NOTE) Fact Sheet for Patients: BloggerCourse.com  Fact Sheet for Healthcare Providers: SeriousBroker.it  This test is not yet approved or cleared by the [place] FDA and has been authorized for detection and/or diagnosis of SARS-CoV-2 by FDA under an Emergency Use Authorization (EUA). This EUA will remain in effect (meaning this test can be used) for the duration of the COVID-19 declaration under Section 564(b)(1) of the Act, 21 U.S.C. section 360bbb-3(b)(1), unless the authorization is terminated or revoked.  Performed at [facility], [address] [place], [place] [address]   Blood culture (routine x 2)     Status: None (Preliminary result)   Collection Time: 05/29/23  7:40 PM   Specimen: BLOOD RIGHT  ARM  Result Value Ref Range Status   Specimen Description BLOOD RIGHT ARM  Final   Special Requests   Final    BOTTLES DRAWN AEROBIC AND ANAEROBIC Blood Culture adequate volume   Culture   Final    NO GROWTH 4 DAYS Performed at [facility], [address], [place], [place] [address]    Report Status PENDING  Incomplete  Blood culture (routine x 2)     Status: Abnormal   Collection Time: 05/29/23  7:50 PM   Specimen: BLOOD LEFT ARM  Result Value Ref Range Status   Specimen Description   Final    BLOOD LEFT ARM Performed at [facility], [address] [place], [place] [address]    Special Requests   Final    BOTTLES DRAWN AEROBIC AND ANAEROBIC Blood Culture results may not be optimal due to an inadequate volume of blood received in culture bottles Performed at [facility], [address] [place], [place] [address]    Culture  Setup Time   Final    GRAM NEGATIVE RODS AEROBIC BOTTLE ONLY Organism ID to follow CRITICAL RESULT CALLED TO, READ BACK BY AND VERIFIED  WITH: Mila Merry 05/30/23 1213 KLW Performed at [facility], [address] [place], [place] [address]    Culture ACINETOBACTER SPECIES ACINETOBACTER LWOFFII  (A)  Final   Report Status 06/02/2023 FINAL  Final   Organism ID, Bacteria ACINETOBACTER SPECIES  Final   Organism ID, Bacteria ACINETOBACTER LWOFFII  Final      Susceptibility   Acinetobacter lwoffii - MIC*    CEFTAZIDIME 2 SENSITIVE Sensitive     CIPROFLOXACIN 0.5 SENSITIVE Sensitive     GENTAMICIN <=1 SENSITIVE Sensitive     IMIPENEM 0.5 SENSITIVE Sensitive     PIP/TAZO >=128 RESISTANT Resistant ug/mL    TRIMETH/SULFA <=20 SENSITIVE Sensitive     AMPICILLIN/SULBACTAM <=2 SENSITIVE Sensitive     * ACINETOBACTER LWOFFII   Acinetobacter species - MIC*    CEFTAZIDIME <=1 SENSITIVE Sensitive     CIPROFLOXACIN <=0.25 SENSITIVE Sensitive     GENTAMICIN <=1 SENSITIVE Sensitive     IMIPENEM <=0.25 SENSITIVE Sensitive      TRIMETH/SULFA <=20 SENSITIVE Sensitive     AMPICILLIN/SULBACTAM <=2 SENSITIVE Sensitive     * ACINETOBACTER SPECIES  Blood Culture ID Panel (Reflexed)     Status: None   Collection Time: 05/29/23  7:50 PM  Result Value Ref Range Status   Enterococcus faecalis NOT DETECTED NOT DETECTED Final   Enterococcus Faecium NOT DETECTED NOT DETECTED Final   Listeria monocytogenes NOT DETECTED NOT DETECTED Final   Staphylococcus species NOT DETECTED NOT DETECTED Final   Staphylococcus aureus (BCID) NOT DETECTED NOT DETECTED Final   Staphylococcus epidermidis NOT DETECTED NOT DETECTED Final   Staphylococcus lugdunensis NOT DETECTED NOT DETECTED Final   Streptococcus species NOT DETECTED NOT DETECTED Final   Streptococcus agalactiae NOT DETECTED NOT DETECTED Final   Streptococcus pneumoniae NOT DETECTED NOT DETECTED Final   Streptococcus pyogenes NOT DETECTED NOT DETECTED Final   A.calcoaceticus-baumannii NOT DETECTED NOT DETECTED Final   Bacteroides fragilis NOT DETECTED NOT DETECTED Final   Enterobacterales NOT DETECTED NOT DETECTED Final   Enterobacter cloacae complex NOT DETECTED NOT DETECTED Final   Escherichia coli NOT DETECTED NOT DETECTED Final   Klebsiella aerogenes NOT DETECTED NOT DETECTED Final   Klebsiella oxytoca NOT DETECTED NOT DETECTED Final   Klebsiella pneumoniae NOT DETECTED NOT DETECTED Final   Proteus species NOT DETECTED NOT DETECTED Final   Salmonella species NOT DETECTED NOT DETECTED Final   Serratia marcescens NOT DETECTED NOT DETECTED Final   Haemophilus influenzae NOT DETECTED NOT DETECTED Final   Neisseria meningitidis NOT DETECTED NOT DETECTED Final   Pseudomonas aeruginosa NOT DETECTED NOT DETECTED Final   Stenotrophomonas maltophilia NOT DETECTED NOT DETECTED Final   Candida albicans NOT DETECTED NOT DETECTED Final   Candida auris NOT DETECTED NOT DETECTED Final   Candida glabrata NOT DETECTED NOT DETECTED Final   Candida krusei NOT DETECTED NOT DETECTED Final    Candida parapsilosis NOT DETECTED NOT DETECTED Final   Candida tropicalis NOT DETECTED NOT DETECTED Final   Cryptococcus neoformans/gattii NOT DETECTED NOT DETECTED Final    Comment: Performed at [facility], [address] [place], [place] [address]  MRSA Next Gen by PCR, Nasal     Status: None   Collection Time: 05/30/23 10:24 AM   Specimen: Nasal Mucosa; Nasal Swab  Result Value Ref Range Status   MRSA by PCR Next Gen NOT DETECTED NOT DETECTED Final    Comment: (NOTE) The GeneXpert MRSA Assay (FDA approved for NASAL specimens only), is one component of a comprehensive MRSA colonization surveillance  program. It is not intended to diagnose MRSA infection nor to guide or monitor treatment for MRSA infections. Test performance is not FDA approved in patients less than 58 years old. Performed at [facility], [address] [place], [place] [address]   Culture, blood (Routine X 2) w Reflex to ID Panel     Status: None (Preliminary result)   Collection Time: 06/01/23  3:49 PM   Specimen: BLOOD  Result Value Ref Range Status   Specimen Description BLOOD BLOOD RIGHT HAND  Final   Special Requests   Final    BOTTLES DRAWN AEROBIC AND ANAEROBIC Blood Culture results may not be optimal due to an inadequate volume of blood received in culture bottles   Culture   Final    NO GROWTH < 24 HOURS Performed at [facility], [address] [place], [place] [address]    Report Status PENDING  Incomplete  Culture, blood (Routine X 2) w Reflex to ID Panel     Status: None (Preliminary result)   Collection Time: 06/01/23  3:50 PM   Specimen: BLOOD  Result Value Ref Range Status   Specimen Description BLOOD BLOOD LEFT HAND  Final   Special Requests   Final    BOTTLES DRAWN AEROBIC AND ANAEROBIC Blood Culture results may not be optimal due to an inadequate volume of blood received in culture bottles   Culture   Final    NO GROWTH < 24 HOURS Performed at [facility], [address] [place], [place] [address]    Report Status PENDING  Incomplete    Labs: CBC: Recent Labs  Lab 05/29/23 1802 05/30/23 0435 05/31/23 0547 06/01/23 0424  WBC 12.6* 11.3* 10.4 10.6*  NEUTROABS 9.7*  --   --   --   HGB 13.5 12.5* 12.4* 12.8*  HCT 42.8 39.6 38.3* 38.8*  MCV 93.7 94.3 90.8 90.7  PLT 463* 421* 414* 425*   Basic Metabolic Panel: Recent Labs  Lab 05/29/23 1802 05/30/23 0435 05/31/23 0547 06/01/23 0424  NA 135 135 132* 131*  K 4.2 5.5* 5.2* 4.7  CL 103 101 98 97*  CO2 22 25 28 25   GLUCOSE 115* 185* 203* 202*  BUN 27* 28* 27* 30*  CREATININE 0.79 0.71 0.83 0.79  CALCIUM 9.6 9.2 9.4 9.2   Liver Function Tests: Recent Labs  Lab 05/29/23 1802  AST 20  ALT 21  ALKPHOS 44  BILITOT 0.6  PROT 7.5  ALBUMIN 3.9   CBG: Recent Labs  Lab 06/01/23 0820 06/01/23 1212 06/01/23 1711 06/01/23 2229 06/02/23 0824  GLUCAP 150* 182* 191* 181* 178*    Discharge time spent: 35 minutes.  Signed: Marcelino Duster, MD [facility] 06/02/2023

## 2023-06-02 NOTE — Plan of Care (Signed)
  Problem: Education: Goal: Ability to describe self-care measures that may prevent or decrease complications (Diabetes Survival Skills Education) will improve Outcome: Progressing Goal: Individualized Educational Video(s) Outcome: Progressing   Problem: Coping: Goal: Ability to adjust to condition or change in health will improve Outcome: Progressing   Problem: Coping: Goal: Ability to adjust to condition or change in health will improve Outcome: Progressing   Problem: Fluid Volume: Goal: Ability to maintain a balanced intake and output will improve Outcome: Progressing   Problem: Health Behavior/Discharge Planning: Goal: Ability to identify and utilize available resources and services will improve Outcome: Progressing Goal: Ability to manage health-related needs will improve Outcome: Progressing   Problem: Metabolic: Goal: Ability to maintain appropriate glucose levels will improve Outcome: Progressing   Problem: Nutritional: Goal: Maintenance of adequate nutrition will improve Outcome: Progressing Goal: Progress toward achieving an optimal weight will improve Outcome: Progressing   Problem: Skin Integrity: Goal: Risk for impaired skin integrity will decrease Outcome: Progressing   Problem: Tissue Perfusion: Goal: Adequacy of tissue perfusion will improve Outcome: Progressing

## 2023-06-02 NOTE — Progress Notes (Signed)
 Physical Therapy Treatment Patient Details Name: Jimmy Bishop MRN: [id number] DOB: Nov 04, 1960 Today's Date: 06/02/2023   History of Present Illness Pt is a 63 y/o M admitted on 05/29/23 after presenting with c/o SOB x 2 weeks, cough. CTA showed RUL & RLL PNA. PMH: HTN, HLD, DM, COPD, tobacco abuse, anemia, recent R hip replacement 3/5    PT Comments  Pt was sitting in recliner upon arrival. He is A and O x 4. Agrees to session and is cooperative throughout. "I want to go home today." He demonstrated safe abilities to stand, ambulate, and perform stairs without difficulty. Pt is cleared from an acute PT standpoint for safe DC home with HHPT to follow.    If plan is discharge home, recommend the following: A little help with walking and/or transfers;A little help with bathing/dressing/bathroom;Assist for transportation;Help with stairs or ramp for entrance;Assistance with cooking/housework     Equipment Recommendations  None recommended by PT       Precautions / Restrictions Precautions Precautions: Fall;Posterior Hip Precaution Booklet Issued: Yes (comment) Recall of Precautions/Restrictions: Intact Precaution/Restrictions Comments: 2/3 recalled Restrictions Weight Bearing Restrictions Per Provider Order: Yes RLE Weight Bearing Per Provider Order: Weight bearing as tolerated     Mobility  Bed Mobility  General bed mobility comments: In recliner pre/post session Patient Response: Cooperative  Transfers Overall transfer level: Needs assistance Equipment used: Rolling walker (2 wheels) Transfers: Sit to/from Stand Sit to Stand: Modified independent (Device/Increase time)   Ambulation/Gait Ambulation/Gait assistance: Modified independent (Device/Increase time) Gait Distance (Feet): 200 Feet Assistive device: Rolling walker (2 wheels) Gait Pattern/deviations: Step-through pattern, Antalgic Gait velocity: decreased  General Gait Details: pt was able to ambulate ~ 200 ft with  RW. no LOB or safety concern with use of RW during standing activity.   Stairs Stairs: Yes Stairs assistance: Supervision Stair Management: Two rails, Step to pattern Number of Stairs: 4    Tilt Bed Patient Response: Cooperative    Balance Overall balance assessment: Modified Independent  Standing balance support: Bilateral upper extremity supported, During functional activity, Reliant on assistive device for balance Standing balance-Leahy Scale: Good     Communication Communication Communication: No apparent difficulties  Cognition Arousal: Alert Behavior During Therapy: WFL for tasks assessed/performed   PT - Cognitive impairments: No apparent impairments      PT - Cognition Comments: Pt A and O x 4 Following commands: Intact      Cueing Cueing Techniques: Verbal cues         Pertinent Vitals/Pain Pain Assessment Pain Assessment: 0-10 Pain Score: 6  Pain Location: R hip Pain Descriptors / Indicators: Discomfort, Sharp Pain Intervention(s): Limited activity within patient's tolerance, Monitored during session, Premedicated before session, Repositioned     PT Goals (current goals can now be found in the care plan section) Acute Rehab PT Goals Patient Stated Goal: get better, return home Progress towards PT goals: Progressing toward goals    Frequency    Min 3X/week       AM-PAC PT "6 Clicks" Mobility   Outcome Measure  Help needed turning from your back to your side while in a flat bed without using bedrails?: None Help needed moving from lying on your back to sitting on the side of a flat bed without using bedrails?: A Little Help needed moving to and from a bed to a chair (including a wheelchair)?: A Little Help needed standing up from a chair using your arms (e.g., wheelchair or bedside chair)?: A Little Help needed to  walk in hospital room?: A Little Help needed climbing 3-5 steps with a railing? : A Little 6 Click Score: 19    End of Session    Activity Tolerance: Patient tolerated treatment well Patient left: in chair;with call bell/phone within reach;with chair alarm set Nurse Communication: Mobility status PT Visit Diagnosis: Pain;Other abnormalities of gait and mobility (R26.89);Muscle weakness (generalized) (M62.81);Difficulty in walking, not elsewhere classified (R26.2) Pain - Right/Left: Right Pain - part of body: Hip     Time: 0850-0904 PT Time Calculation (min) (ACUTE ONLY): 14 min  Charges:    $Gait Training: 8-22 mins PT General Charges $$ ACUTE PT VISIT: 1 Visit                     Jetta Lout PTA 06/02/23, 12:57 PM

## 2023-06-02 NOTE — TOC Progression Note (Signed)
 Transition of Care [facility]) - Progression Note    Patient Details  Name: Jimmy Bishop MRN: [id number] Date of Birth: 06-22-1960  Transition of Care [facility]) CM/SW Contact  Erin Sons, [place] Phone Number: 06/02/2023, 10:36 AM  Clinical Narrative:     Notified [facility] of pt's DC today. They will follow up for home health.        Expected Discharge Plan and Services         Expected Discharge Date: 06/01/23                                     Social Determinants of Health (SDOH) Interventions SDOH Screenings   Food Insecurity: No Food Insecurity (05/30/2023)  Housing: Low Risk  (05/30/2023)  Transportation Needs: No Transportation Needs (05/30/2023)  Utilities: Not At Risk (05/30/2023)  Depression (PHQ2-9): Low Risk  (03/14/2023)  Financial Resource Strain: Low Risk  (05/24/2022)   Received from [facility], [facility]  Tobacco Use: High Risk (05/30/2023)    Readmission Risk Interventions     No data to display

## 2023-06-03 LAB — CULTURE, BLOOD (ROUTINE X 2)
Culture: NO GROWTH
Special Requests: ADEQUATE

## 2023-06-04 DIAGNOSIS — Z471 Aftercare following joint replacement surgery: Secondary | ICD-10-CM | POA: Diagnosis not present

## 2023-06-04 DIAGNOSIS — Z7984 Long term (current) use of oral hypoglycemic drugs: Secondary | ICD-10-CM | POA: Diagnosis not present

## 2023-06-04 DIAGNOSIS — Z96641 Presence of right artificial hip joint: Secondary | ICD-10-CM | POA: Diagnosis not present

## 2023-06-04 DIAGNOSIS — F17200 Nicotine dependence, unspecified, uncomplicated: Secondary | ICD-10-CM | POA: Diagnosis not present

## 2023-06-04 DIAGNOSIS — I10 Essential (primary) hypertension: Secondary | ICD-10-CM | POA: Diagnosis not present

## 2023-06-04 DIAGNOSIS — Z9981 Dependence on supplemental oxygen: Secondary | ICD-10-CM | POA: Diagnosis not present

## 2023-06-04 DIAGNOSIS — M1712 Unilateral primary osteoarthritis, left knee: Secondary | ICD-10-CM | POA: Diagnosis not present

## 2023-06-04 DIAGNOSIS — Z981 Arthrodesis status: Secondary | ICD-10-CM | POA: Diagnosis not present

## 2023-06-04 DIAGNOSIS — J302 Other seasonal allergic rhinitis: Secondary | ICD-10-CM | POA: Diagnosis not present

## 2023-06-04 DIAGNOSIS — J449 Chronic obstructive pulmonary disease, unspecified: Secondary | ICD-10-CM | POA: Diagnosis not present

## 2023-06-04 DIAGNOSIS — E785 Hyperlipidemia, unspecified: Secondary | ICD-10-CM | POA: Diagnosis not present

## 2023-06-04 DIAGNOSIS — Z7901 Long term (current) use of anticoagulants: Secondary | ICD-10-CM | POA: Diagnosis not present

## 2023-06-04 DIAGNOSIS — E119 Type 2 diabetes mellitus without complications: Secondary | ICD-10-CM | POA: Diagnosis not present

## 2023-06-06 DIAGNOSIS — Z981 Arthrodesis status: Secondary | ICD-10-CM | POA: Diagnosis not present

## 2023-06-06 DIAGNOSIS — Z96641 Presence of right artificial hip joint: Secondary | ICD-10-CM | POA: Diagnosis not present

## 2023-06-06 DIAGNOSIS — M1712 Unilateral primary osteoarthritis, left knee: Secondary | ICD-10-CM | POA: Diagnosis not present

## 2023-06-06 DIAGNOSIS — E119 Type 2 diabetes mellitus without complications: Secondary | ICD-10-CM | POA: Diagnosis not present

## 2023-06-06 DIAGNOSIS — F17200 Nicotine dependence, unspecified, uncomplicated: Secondary | ICD-10-CM | POA: Diagnosis not present

## 2023-06-06 DIAGNOSIS — Z7984 Long term (current) use of oral hypoglycemic drugs: Secondary | ICD-10-CM | POA: Diagnosis not present

## 2023-06-06 DIAGNOSIS — J302 Other seasonal allergic rhinitis: Secondary | ICD-10-CM | POA: Diagnosis not present

## 2023-06-06 DIAGNOSIS — I10 Essential (primary) hypertension: Secondary | ICD-10-CM | POA: Diagnosis not present

## 2023-06-06 DIAGNOSIS — Z9981 Dependence on supplemental oxygen: Secondary | ICD-10-CM | POA: Diagnosis not present

## 2023-06-06 DIAGNOSIS — J449 Chronic obstructive pulmonary disease, unspecified: Secondary | ICD-10-CM | POA: Diagnosis not present

## 2023-06-06 DIAGNOSIS — E785 Hyperlipidemia, unspecified: Secondary | ICD-10-CM | POA: Diagnosis not present

## 2023-06-06 DIAGNOSIS — Z7901 Long term (current) use of anticoagulants: Secondary | ICD-10-CM | POA: Diagnosis not present

## 2023-06-06 DIAGNOSIS — Z471 Aftercare following joint replacement surgery: Secondary | ICD-10-CM | POA: Diagnosis not present

## 2023-06-06 LAB — CULTURE, BLOOD (ROUTINE X 2)
Culture: NO GROWTH
Culture: NO GROWTH

## 2023-06-08 ENCOUNTER — Other Ambulatory Visit: Payer: Self-pay | Admitting: Surgery

## 2023-06-08 ENCOUNTER — Other Ambulatory Visit: Payer: Self-pay | Admitting: Family Medicine

## 2023-06-08 ENCOUNTER — Ambulatory Visit
Admission: RE | Admit: 2023-06-08 | Discharge: 2023-06-08 | Disposition: A | Discharge status: Home / Self Care | Source: Ambulatory Visit | Attending: Surgery | Admitting: Surgery

## 2023-06-08 DIAGNOSIS — E785 Hyperlipidemia, unspecified: Secondary | ICD-10-CM | POA: Diagnosis not present

## 2023-06-08 DIAGNOSIS — Z7901 Long term (current) use of anticoagulants: Secondary | ICD-10-CM | POA: Diagnosis not present

## 2023-06-08 DIAGNOSIS — Z7984 Long term (current) use of oral hypoglycemic drugs: Secondary | ICD-10-CM | POA: Diagnosis not present

## 2023-06-08 DIAGNOSIS — M7989 Other specified soft tissue disorders: Secondary | ICD-10-CM | POA: Insufficient documentation

## 2023-06-08 DIAGNOSIS — E119 Type 2 diabetes mellitus without complications: Secondary | ICD-10-CM | POA: Diagnosis not present

## 2023-06-08 DIAGNOSIS — F17200 Nicotine dependence, unspecified, uncomplicated: Secondary | ICD-10-CM | POA: Diagnosis not present

## 2023-06-08 DIAGNOSIS — M1712 Unilateral primary osteoarthritis, left knee: Secondary | ICD-10-CM | POA: Diagnosis not present

## 2023-06-08 DIAGNOSIS — J449 Chronic obstructive pulmonary disease, unspecified: Secondary | ICD-10-CM | POA: Diagnosis not present

## 2023-06-08 DIAGNOSIS — M1611 Unilateral primary osteoarthritis, right hip: Secondary | ICD-10-CM

## 2023-06-08 DIAGNOSIS — G8929 Other chronic pain: Secondary | ICD-10-CM

## 2023-06-08 DIAGNOSIS — Z471 Aftercare following joint replacement surgery: Secondary | ICD-10-CM | POA: Diagnosis not present

## 2023-06-08 DIAGNOSIS — Z96641 Presence of right artificial hip joint: Secondary | ICD-10-CM | POA: Diagnosis not present

## 2023-06-08 DIAGNOSIS — J302 Other seasonal allergic rhinitis: Secondary | ICD-10-CM | POA: Diagnosis not present

## 2023-06-08 DIAGNOSIS — Z981 Arthrodesis status: Secondary | ICD-10-CM | POA: Diagnosis not present

## 2023-06-08 DIAGNOSIS — Z9981 Dependence on supplemental oxygen: Secondary | ICD-10-CM | POA: Diagnosis not present

## 2023-06-08 DIAGNOSIS — I10 Essential (primary) hypertension: Secondary | ICD-10-CM | POA: Diagnosis not present

## 2023-06-09 NOTE — Telephone Encounter (Signed)
 Requested Prescriptions  Pending Prescriptions Disp Refills   gabapentin (NEURONTIN) 600 MG tablet [Pharmacy Med Name: GABAPENTIN 600MG  TABLET] 90 tablet 0    Sig: TAKE 1 TABLET BY MOUTH 3 TIMES DAILY     Neurology: Anticonvulsants - gabapentin Passed - 06/09/2023  3:51 PM      Passed - Cr in normal range and within 360 days    Creatinine  Date Value Ref Range Status  06/25/2014 0.77 mg/dL Final    Comment:    2.95-6.21 NOTE: New Reference Range  05/28/14    Creatinine, Ser  Date Value Ref Range Status  06/01/2023 0.79 0.61 - 1.24 mg/dL Final         Passed - Completed PHQ-2 or PHQ-9 in the last 360 days      Passed - Valid encounter within last 12 months    Recent Outpatient Visits           2 months ago Establishing care with new doctor, encounter for   [facility] Pardue, Monico Blitz, DO       Future Appointments             In 4 days Pardue, Monico Blitz, DO [facility]

## 2023-06-10 DIAGNOSIS — Z9981 Dependence on supplemental oxygen: Secondary | ICD-10-CM | POA: Diagnosis not present

## 2023-06-10 DIAGNOSIS — I10 Essential (primary) hypertension: Secondary | ICD-10-CM | POA: Diagnosis not present

## 2023-06-10 DIAGNOSIS — F17200 Nicotine dependence, unspecified, uncomplicated: Secondary | ICD-10-CM | POA: Diagnosis not present

## 2023-06-10 DIAGNOSIS — E119 Type 2 diabetes mellitus without complications: Secondary | ICD-10-CM | POA: Diagnosis not present

## 2023-06-10 DIAGNOSIS — Z7984 Long term (current) use of oral hypoglycemic drugs: Secondary | ICD-10-CM | POA: Diagnosis not present

## 2023-06-10 DIAGNOSIS — M1712 Unilateral primary osteoarthritis, left knee: Secondary | ICD-10-CM | POA: Diagnosis not present

## 2023-06-10 DIAGNOSIS — J302 Other seasonal allergic rhinitis: Secondary | ICD-10-CM | POA: Diagnosis not present

## 2023-06-10 DIAGNOSIS — Z96641 Presence of right artificial hip joint: Secondary | ICD-10-CM | POA: Diagnosis not present

## 2023-06-10 DIAGNOSIS — Z471 Aftercare following joint replacement surgery: Secondary | ICD-10-CM | POA: Diagnosis not present

## 2023-06-10 DIAGNOSIS — E785 Hyperlipidemia, unspecified: Secondary | ICD-10-CM | POA: Diagnosis not present

## 2023-06-10 DIAGNOSIS — J449 Chronic obstructive pulmonary disease, unspecified: Secondary | ICD-10-CM | POA: Diagnosis not present

## 2023-06-10 DIAGNOSIS — Z7901 Long term (current) use of anticoagulants: Secondary | ICD-10-CM | POA: Diagnosis not present

## 2023-06-10 DIAGNOSIS — Z981 Arthrodesis status: Secondary | ICD-10-CM | POA: Diagnosis not present

## 2023-06-13 ENCOUNTER — Encounter: Payer: Self-pay | Admitting: Family Medicine

## 2023-06-13 ENCOUNTER — Ambulatory Visit: Payer: Medicaid Other | Admitting: Family Medicine

## 2023-06-13 VITALS — BP 105/73 | HR 86 | Ht 69.0 in | Wt 216.0 lb

## 2023-06-13 DIAGNOSIS — Z79899 Other long term (current) drug therapy: Secondary | ICD-10-CM | POA: Diagnosis not present

## 2023-06-13 DIAGNOSIS — Z96641 Presence of right artificial hip joint: Secondary | ICD-10-CM | POA: Diagnosis not present

## 2023-06-13 DIAGNOSIS — Z7901 Long term (current) use of anticoagulants: Secondary | ICD-10-CM | POA: Diagnosis not present

## 2023-06-13 DIAGNOSIS — E11A Type 2 diabetes mellitus without complications in remission: Secondary | ICD-10-CM

## 2023-06-13 DIAGNOSIS — Z23 Encounter for immunization: Secondary | ICD-10-CM

## 2023-06-13 DIAGNOSIS — F17200 Nicotine dependence, unspecified, uncomplicated: Secondary | ICD-10-CM | POA: Diagnosis not present

## 2023-06-13 DIAGNOSIS — Z09 Encounter for follow-up examination after completed treatment for conditions other than malignant neoplasm: Secondary | ICD-10-CM | POA: Diagnosis not present

## 2023-06-13 DIAGNOSIS — M1712 Unilateral primary osteoarthritis, left knee: Secondary | ICD-10-CM | POA: Diagnosis not present

## 2023-06-13 DIAGNOSIS — E119 Type 2 diabetes mellitus without complications: Secondary | ICD-10-CM

## 2023-06-13 DIAGNOSIS — J449 Chronic obstructive pulmonary disease, unspecified: Secondary | ICD-10-CM | POA: Diagnosis not present

## 2023-06-13 DIAGNOSIS — J432 Centrilobular emphysema: Secondary | ICD-10-CM

## 2023-06-13 DIAGNOSIS — E1169 Type 2 diabetes mellitus with other specified complication: Secondary | ICD-10-CM | POA: Diagnosis not present

## 2023-06-13 DIAGNOSIS — I10 Essential (primary) hypertension: Secondary | ICD-10-CM | POA: Diagnosis not present

## 2023-06-13 DIAGNOSIS — J302 Other seasonal allergic rhinitis: Secondary | ICD-10-CM | POA: Diagnosis not present

## 2023-06-13 DIAGNOSIS — Z981 Arthrodesis status: Secondary | ICD-10-CM | POA: Diagnosis not present

## 2023-06-13 DIAGNOSIS — I700 Atherosclerosis of aorta: Secondary | ICD-10-CM

## 2023-06-13 DIAGNOSIS — Z9981 Dependence on supplemental oxygen: Secondary | ICD-10-CM | POA: Diagnosis not present

## 2023-06-13 DIAGNOSIS — Z7984 Long term (current) use of oral hypoglycemic drugs: Secondary | ICD-10-CM | POA: Diagnosis not present

## 2023-06-13 DIAGNOSIS — E785 Hyperlipidemia, unspecified: Secondary | ICD-10-CM | POA: Diagnosis not present

## 2023-06-13 DIAGNOSIS — Z471 Aftercare following joint replacement surgery: Secondary | ICD-10-CM | POA: Diagnosis not present

## 2023-06-13 MED ORDER — PRAVASTATIN SODIUM 40 MG PO TABS
40.0000 mg | ORAL_TABLET | Freq: Every day | ORAL | 3 refills | Status: DC
Start: 2023-06-13 — End: 2024-01-05

## 2023-06-13 MED ORDER — ALBUTEROL SULFATE HFA 108 (90 BASE) MCG/ACT IN AERS: 2.0000 | INHALATION_SPRAY | Freq: Four times a day (QID) | RESPIRATORY_TRACT | 2 refills | Status: DC | PRN

## 2023-06-13 MED ORDER — STIOLTO RESPIMAT 2.5-2.5 MCG/ACT IN AERS
2.0000 | INHALATION_SPRAY | Freq: Every day | RESPIRATORY_TRACT | 1 refills | Status: DC
Start: 2023-06-13 — End: 2023-11-22

## 2023-06-13 NOTE — Assessment & Plan Note (Signed)
 Noted.  Continue lisinopril, amlodipine, and pravastatin.

## 2023-06-13 NOTE — Assessment & Plan Note (Signed)
 Eligible for vaccinations and screenings. Plans for COVID booster and shingles vaccine. Declined hepatitis C screening. - Encourage COVID booster. - Discuss flu shot for next season. - Recommend shingles vaccine at pharmacy. - Patient declines hepatitis C screening today; offer hepatitis C screening in future if he changes mind.

## 2023-06-13 NOTE — Assessment & Plan Note (Signed)
 Well-controlled diabetes on metformin. Potential B12 absorption issue. - Check vitamin B12 levels.

## 2023-06-13 NOTE — Assessment & Plan Note (Signed)
 Blood pressure well-controlled on lisinopril and amlodipine. - Continue lisinopril and amlodipine.

## 2023-06-13 NOTE — Assessment & Plan Note (Addendum)
 Emphysema increases pneumonia risk. Smoking cessation advised. Eligible for pneumonia vaccine. Will defer to subsequent visit - Continue Stiolto and albuterol inhalers. - Encourage smoking cessation.

## 2023-06-13 NOTE — Progress Notes (Signed)
 Established patient visit   Patient: Jimmy Bishop   DOB: [birth year]   63 y.o. Male  MRN: [id number] Visit Date: 06/13/2023  Today's healthcare provider: Sherlyn Hay, DO   Chief Complaint  Patient presents with   Hypertension    He has been keeping record at home his bp has been normal top number around 110 he can't remember the bottom number    Diabetes    No concerns    Subjective    Hypertension Pertinent negatives include no chest pain, headaches, palpitations or shortness of breath.  Diabetes Hypoglycemia symptoms include dizziness. Pertinent negatives for hypoglycemia include no headaches. Pertinent negatives for diabetes include no chest pain and no weakness.   Edmonia Bishop "Harvie Heck" is a 63 year old male who presents for follow-up care after hip surgery and pneumonia.  He underwent hip surgery earlier this month and subsequently developed pneumonia. He completed a course of ciprofloxacin and prednisone and is feeling better now. He has a history of emphysema and uses Stiolto daily for breathing. He also uses inhalers, including Stiolto and albuterol, which are managed through [employer]. He has not received the pneumonia vaccine this year.  He is on lisinopril and amlodipine for hypertension, with his blood pressure well-controlled at 105/73 mmHg. He does not monitor his blood pressure at home.  He is on metformin for Type 2 Diabetes Mellitus.  No recent episodes of low blood sugar, lightheadedness, dizziness, nausea, vomiting, or constipation.  He requires a refill of pravastatin for hyperlipidemia management.  He is on gabapentin for chronic pain and reports having enough supply for two weeks or more. He is doing well on the medication, taking it three times a day.  He has a 40 pack-year smoking history and is currently trying to cut back, having smoked only one cigarette today.  He has not received the pneumonia vaccine, COVID booster, flu  shot, or shingles vaccine this year. He has not been screened for hepatitis C and does not wish to be screened at this time.      Medications: Outpatient Medications Prior to Visit  Medication Sig   acetaminophen (TYLENOL) 500 MG tablet Take 2 tablets (1,000 mg total) by mouth every 6 (six) hours.   amLODipine (NORVASC) 10 MG tablet Take 10 mg by mouth daily.   apixaban (ELIQUIS) 2.5 MG TABS tablet Take 1 tablet (2.5 mg total) by mouth 2 (two) times daily.   Blood Glucose Monitoring Suppl DEVI 1 each by Does not apply route daily before breakfast. May substitute to any manufacturer covered by patient's insurance.   cetirizine (ZYRTEC) 10 MG tablet Take 1 tablet (10 mg total) by mouth daily.   gabapentin (NEURONTIN) 600 MG tablet TAKE 1 TABLET BY MOUTH 3 TIMES DAILY   Glucose Blood (BLOOD GLUCOSE TEST STRIPS) STRP 1 each by In Vitro route daily before breakfast. May substitute to any manufacturer covered by patient's insurance.   Lancet Device MISC 1 each by Does not apply route daily before breakfast. May substitute to any manufacturer covered by patient's insurance.   Lancets Misc. MISC 1 each by Does not apply route daily before breakfast. May substitute to any manufacturer covered by patient's insurance.   lisinopril (ZESTRIL) 20 MG tablet Take 20 mg by mouth daily.   metFORMIN (GLUCOPHAGE) 500 MG tablet Take 1 tablet (500 mg total) by mouth 2 (two) times daily with a meal.   [DISCONTINUED] ondansetron (ZOFRAN) 4 MG tablet Take 1 tablet (4  mg total) by mouth every 6 (six) hours as needed for nausea.   [DISCONTINUED] pravastatin (PRAVACHOL) 40 MG tablet Take 40 mg by mouth daily.   [DISCONTINUED] STIOLTO RESPIMAT 2.5-2.5 MCG/ACT AERS Inhale 2 puffs into the lungs daily.   No facility-administered medications prior to visit.    Review of Systems  Respiratory: Negative.  Negative for cough, shortness of breath and wheezing.   Cardiovascular:  Negative for chest pain, palpitations and leg  swelling.  Gastrointestinal:  Negative for nausea and vomiting.  Neurological:  Positive for dizziness. Negative for weakness and headaches.         Objective    BP 105/73   Pulse 86   Ht 5\' 9"  (1.753 m)   Wt 216 lb (98 kg)   SpO2 99%   BMI 31.90 kg/m     Physical Exam Vitals reviewed.  Constitutional:      General: He is not in acute distress.    Appearance: Normal appearance. He is not diaphoretic.  HENT:     Head: Normocephalic and atraumatic.  Eyes:     General: No scleral icterus.    Conjunctiva/sclera: Conjunctivae normal.  Cardiovascular:     Rate and Rhythm: Normal rate and regular rhythm.     Pulses: Normal pulses.     Heart sounds: Normal heart sounds. No murmur heard. Pulmonary:     Effort: Pulmonary effort is normal. No respiratory distress.     Breath sounds: Normal breath sounds. No stridor. No wheezing or rhonchi.  Musculoskeletal:     Cervical back: Neck supple.     Right lower leg: No edema.     Left lower leg: No edema.     Comments: Currently using a walker; due to recent hip surgery.  Lymphadenopathy:     Cervical: No cervical adenopathy.  Skin:    General: Skin is warm and dry.     Findings: No rash.  Neurological:     Mental Status: He is alert and oriented to person, place, and time. Mental status is at baseline.  Psychiatric:        Mood and Affect: Mood normal.        Behavior: Behavior normal.      Results for orders placed or performed in visit on 06/13/23  Hemoglobin A1c  Result Value Ref Range   Hgb A1c MFr Bld 6.6 (H) 4.8 - 5.6 %   Est. average glucose Bld gHb Est-mCnc 143 mg/dL  Comprehensive metabolic panel  Result Value Ref Range   Glucose 66 (L) 70 - 99 mg/dL   BUN 14 8 - 27 mg/dL   Creatinine, Ser 1.61 0.76 - 1.27 mg/dL   eGFR 096 >04 VW/UJW/1.19   BUN/Creatinine Ratio 18 10 - 24   Sodium 137 134 - 144 mmol/L   Potassium 5.0 3.5 - 5.2 mmol/L   Chloride 97 96 - 106 mmol/L   CO2 25 20 - 29 mmol/L   Calcium 9.8 8.6  - 10.2 mg/dL   Total Protein 6.3 6.0 - 8.5 g/dL   Albumin 4.1 3.9 - 4.9 g/dL   Globulin, Total 2.2 1.5 - 4.5 g/dL   Bilirubin Total 0.3 0.0 - 1.2 mg/dL   Alkaline Phosphatase 88 44 - 121 IU/L   AST 16 0 - 40 IU/L   ALT 22 0 - 44 IU/L  Lipid panel  Result Value Ref Range   Cholesterol, Total 153 100 - 199 mg/dL   Triglycerides 147 (H) 0 - 149 mg/dL   HDL  48 >39 mg/dL   VLDL Cholesterol Cal 26 5 - 40 mg/dL   LDL Chol Calc (NIH) 79 0 - 99 mg/dL   Chol/HDL Ratio 3.2 0.0 - 5.0 ratio  Vitamin B12  Result Value Ref Range   Vitamin B-12 135 (L) 232 - 1,245 pg/mL    Assessment & Plan    Type 2 diabetes mellitus in remission [facility]) Assessment & Plan: Well-controlled diabetes on metformin. Potential B12 absorption issue. - Check vitamin B12 levels.  Orders: -     Pravastatin Sodium; Take 1 tablet (40 mg total) by mouth daily.  Dispense: 90 tablet; Refill: 3 -     Hemoglobin A1c -     Comprehensive metabolic panel with GFR -     Lipid panel -     Vitamin B12  Chronic obstructive pulmonary disease, unspecified COPD type (HCC) Assessment & Plan: Emphysema increases pneumonia risk. Smoking cessation advised. Eligible for pneumonia vaccine. Will defer to subsequent visit - Continue Stiolto and albuterol inhalers. - Encourage smoking cessation.  Orders: -     Stiolto Respimat; Inhale 2 puffs into the lungs daily.  Dispense: 36 g; Refill: 1 -     Albuterol Sulfate HFA; Inhale 2 puffs into the lungs every 6 (six) hours as needed for wheezing or shortness of breath.  Dispense: 18 g; Refill: 2  Aortic atherosclerosis (HCC) Assessment & Plan: Noted.  Continue lisinopril, amlodipine, and pravastatin.   Centrilobular emphysema (HCC) Assessment & Plan: Emphysema increases pneumonia risk. Smoking cessation advised. Eligible for pneumonia vaccine. Will defer to subsequent visit - Continue Stiolto and albuterol inhalers. - Encourage smoking cessation.   Encounter for Prevnar pneumococcal  vaccination  High risk medication use -     Vitamin B12  Primary hypertension Assessment & Plan: Blood pressure well-controlled on lisinopril and amlodipine. - Continue lisinopril and amlodipine.   Follow-up exam, 3-6 months since previous exam Assessment & Plan: Eligible for vaccinations and screenings. Plans for COVID booster and shingles vaccine. Declined hepatitis C screening. - Encourage COVID booster. - Discuss flu shot for next season. - Recommend shingles vaccine at pharmacy. - Patient declines hepatitis C screening today; offer hepatitis C screening in future if he changes mind.    Postoperative Pneumonia Developed pneumonia post-hip surgery, likely hospital-acquired. Completed ciprofloxacin and prednisone with improvement. - No further antibiotics needed.   Return in about 3 months (around 09/13/2023) for DM, HTN.      I discussed the assessment and treatment plan with the patient  The patient was provided an opportunity to ask questions and all were answered. The patient agreed with the plan and demonstrated an understanding of the instructions.   The patient was advised to call back or seek an in-person evaluation if the symptoms worsen or if the condition fails to improve as anticipated.    Sherlyn Hay, DO  [facility] [phone] (phone) [phone] (fax)  [facility]

## 2023-06-14 ENCOUNTER — Other Ambulatory Visit: Payer: Self-pay | Admitting: Family Medicine

## 2023-06-14 LAB — VITAMIN B12: Vitamin B-12: 135 pg/mL — ABNORMAL LOW (ref 232–1245)

## 2023-06-14 LAB — COMPREHENSIVE METABOLIC PANEL
ALT: 22 IU/L (ref 0–44)
AST: 16 IU/L (ref 0–40)
Albumin: 4.1 g/dL (ref 3.9–4.9)
Alkaline Phosphatase: 88 IU/L (ref 44–121)
BUN/Creatinine Ratio: 18 (ref 10–24)
BUN: 14 mg/dL (ref 8–27)
Bilirubin Total: 0.3 mg/dL (ref 0.0–1.2)
CO2: 25 mmol/L (ref 20–29)
Calcium: 9.8 mg/dL (ref 8.6–10.2)
Chloride: 97 mmol/L (ref 96–106)
Creatinine, Ser: 0.76 mg/dL (ref 0.76–1.27)
Globulin, Total: 2.2 g/dL (ref 1.5–4.5)
Glucose: 66 mg/dL — ABNORMAL LOW (ref 70–99)
Potassium: 5 mmol/L (ref 3.5–5.2)
Sodium: 137 mmol/L (ref 134–144)
Total Protein: 6.3 g/dL (ref 6.0–8.5)
eGFR: 101 mL/min/{1.73_m2} (ref >59)

## 2023-06-14 LAB — LIPID PANEL
Chol/HDL Ratio: 3.2 ratio (ref 0.0–5.0)
Cholesterol, Total: 153 mg/dL (ref 100–199)
HDL: 48 mg/dL (ref >39)
LDL Chol Calc (NIH): 79 mg/dL (ref 0–99)
Triglycerides: 153 mg/dL — ABNORMAL HIGH (ref 0–149)
VLDL Cholesterol Cal: 26 mg/dL (ref 5–40)

## 2023-06-14 LAB — HEMOGLOBIN A1C
Est. average glucose Bld gHb Est-mCnc: 143 mg/dL
Hgb A1c MFr Bld: 6.6 % — ABNORMAL HIGH (ref 4.8–5.6)

## 2023-06-16 DIAGNOSIS — I10 Essential (primary) hypertension: Secondary | ICD-10-CM | POA: Diagnosis not present

## 2023-06-16 DIAGNOSIS — Z471 Aftercare following joint replacement surgery: Secondary | ICD-10-CM | POA: Diagnosis not present

## 2023-06-16 DIAGNOSIS — J302 Other seasonal allergic rhinitis: Secondary | ICD-10-CM | POA: Diagnosis not present

## 2023-06-16 DIAGNOSIS — E119 Type 2 diabetes mellitus without complications: Secondary | ICD-10-CM | POA: Diagnosis not present

## 2023-06-16 DIAGNOSIS — Z981 Arthrodesis status: Secondary | ICD-10-CM | POA: Diagnosis not present

## 2023-06-16 DIAGNOSIS — J449 Chronic obstructive pulmonary disease, unspecified: Secondary | ICD-10-CM | POA: Diagnosis not present

## 2023-06-16 DIAGNOSIS — F17200 Nicotine dependence, unspecified, uncomplicated: Secondary | ICD-10-CM | POA: Diagnosis not present

## 2023-06-16 DIAGNOSIS — Z9981 Dependence on supplemental oxygen: Secondary | ICD-10-CM | POA: Diagnosis not present

## 2023-06-16 DIAGNOSIS — E785 Hyperlipidemia, unspecified: Secondary | ICD-10-CM | POA: Diagnosis not present

## 2023-06-16 DIAGNOSIS — Z7901 Long term (current) use of anticoagulants: Secondary | ICD-10-CM | POA: Diagnosis not present

## 2023-06-16 DIAGNOSIS — Z96641 Presence of right artificial hip joint: Secondary | ICD-10-CM | POA: Diagnosis not present

## 2023-06-16 DIAGNOSIS — M1712 Unilateral primary osteoarthritis, left knee: Secondary | ICD-10-CM | POA: Diagnosis not present

## 2023-06-16 DIAGNOSIS — Z7984 Long term (current) use of oral hypoglycemic drugs: Secondary | ICD-10-CM | POA: Diagnosis not present

## 2023-06-29 NOTE — Telephone Encounter (Unsigned)
 Copied from CRM [id number]. Topic: Clinical - Prescription Issue >> Jun 29, 2023 10:59 AM Geroge Baseman wrote: Reason for CRM: Patient would like all prescriptions to go to [facility] on [address], from this point forward.

## 2023-08-05 ENCOUNTER — Other Ambulatory Visit: Payer: Self-pay | Admitting: Family Medicine

## 2023-08-05 DIAGNOSIS — E119 Type 2 diabetes mellitus without complications: Secondary | ICD-10-CM

## 2023-08-05 DIAGNOSIS — G8929 Other chronic pain: Secondary | ICD-10-CM

## 2023-08-26 DIAGNOSIS — Z96641 Presence of right artificial hip joint: Secondary | ICD-10-CM | POA: Diagnosis not present

## 2023-08-26 DIAGNOSIS — M1611 Unilateral primary osteoarthritis, right hip: Secondary | ICD-10-CM | POA: Diagnosis not present

## 2023-09-13 ENCOUNTER — Ambulatory Visit: Admitting: Family Medicine

## 2023-09-26 DIAGNOSIS — G8929 Other chronic pain: Secondary | ICD-10-CM | POA: Diagnosis not present

## 2023-09-26 DIAGNOSIS — M5459 Other low back pain: Secondary | ICD-10-CM | POA: Diagnosis not present

## 2023-09-26 DIAGNOSIS — M47816 Spondylosis without myelopathy or radiculopathy, lumbar region: Secondary | ICD-10-CM | POA: Diagnosis not present

## 2023-11-16 ENCOUNTER — Other Ambulatory Visit: Payer: Self-pay | Admitting: Family Medicine

## 2023-11-16 DIAGNOSIS — G8929 Other chronic pain: Secondary | ICD-10-CM

## 2023-11-22 ENCOUNTER — Ambulatory Visit: Admitting: Family Medicine

## 2023-11-22 ENCOUNTER — Telehealth: Payer: Self-pay

## 2023-11-22 ENCOUNTER — Encounter: Payer: Self-pay | Admitting: Family Medicine

## 2023-11-22 VITALS — BP 140/56 | HR 88 | Ht 69.0 in | Wt 221.0 lb

## 2023-11-22 DIAGNOSIS — J432 Centrilobular emphysema: Secondary | ICD-10-CM

## 2023-11-22 DIAGNOSIS — Z716 Tobacco abuse counseling: Secondary | ICD-10-CM | POA: Diagnosis not present

## 2023-11-22 DIAGNOSIS — F17200 Nicotine dependence, unspecified, uncomplicated: Secondary | ICD-10-CM

## 2023-11-22 DIAGNOSIS — E1159 Type 2 diabetes mellitus with other circulatory complications: Secondary | ICD-10-CM | POA: Diagnosis not present

## 2023-11-22 DIAGNOSIS — E538 Deficiency of other specified B group vitamins: Secondary | ICD-10-CM | POA: Diagnosis not present

## 2023-11-22 DIAGNOSIS — I152 Hypertension secondary to endocrine disorders: Secondary | ICD-10-CM

## 2023-11-22 DIAGNOSIS — J302 Other seasonal allergic rhinitis: Secondary | ICD-10-CM | POA: Diagnosis not present

## 2023-11-22 DIAGNOSIS — E1169 Type 2 diabetes mellitus with other specified complication: Secondary | ICD-10-CM | POA: Diagnosis not present

## 2023-11-22 DIAGNOSIS — J449 Chronic obstructive pulmonary disease, unspecified: Secondary | ICD-10-CM | POA: Diagnosis not present

## 2023-11-22 MED ORDER — VARENICLINE TARTRATE (STARTER) 0.5 MG X 11 & 1 MG X 42 PO TBPK: 1.0000 | ORAL_TABLET | ORAL | 0 refills | Status: DC

## 2023-11-22 MED ORDER — STIOLTO RESPIMAT 2.5-2.5 MCG/ACT IN AERS: 2.0000 | INHALATION_SPRAY | Freq: Every day | RESPIRATORY_TRACT | 5 refills | Status: DC

## 2023-11-22 MED ORDER — CETIRIZINE HCL 10 MG PO TABS: 10.0000 mg | ORAL_TABLET | Freq: Every day | ORAL | 3 refills | Status: DC

## 2023-11-22 MED ORDER — AMLODIPINE BESYLATE 10 MG PO TABS: 10.0000 mg | ORAL_TABLET | Freq: Every day | ORAL | 1 refills | Status: DC

## 2023-11-22 NOTE — Telephone Encounter (Signed)
 Attempted to contact the pt in regards to getting him scheduled for his B12 injections, its Ok for E2C2 to schedule, please schedule 4 visits using the clinical support as the visit type one week apart please.

## 2023-11-22 NOTE — Assessment & Plan Note (Signed)
 Follows with [facility]; will be scheduling an appointment soon. Defer to specialist management.

## 2023-11-22 NOTE — Progress Notes (Signed)
 Established patient visit   Patient: Jimmy Bishop   DOB: 13-Apr-1960   62 y.o. Male  MRN: [id number] Visit Date: 11/22/2023  Today's healthcare provider: LAURAINE LOISE BUOY, DO   Chief Complaint  Patient presents with   Diabetes   Hypertension   Subjective    Diabetes Pertinent negatives for hypoglycemia include no dizziness or headaches. Pertinent negatives for diabetes include no chest pain and no weakness.  Hypertension Pertinent negatives include no chest pain, headaches, palpitations or shortness of breath.    Jimmy Bishop is a 63 year old male with diabetes and hypertension who presents for routine follow-up.  His blood sugar levels range between 74 and 78 mg/dL without hypoglycemic episodes. He maintains a good appetite and adheres to regular meals. His last A1c was 6.6%. He takes metformin  consistently.  His home blood pressure readings are typically around 122 mmHg, though it was 140/58 mmHg this morning. He took amlodipine  10 mg about 30 minutes before the visit. He experiences fatigue and has not yet received B12 injections. He is out of amlodipine  but has sufficient lisinopril .  He has a history of pneumonia requiring hospitalization and uses albuterol  and Stiolto inhalers. He uses albuterol  twice in the morning, two puffs each time. He experiences stomach upset with one inhaler, Incruse Ellipta , but tolerates the others well.  He experiences back pain and is prescribed tizanidine and celecoxib by orthopedics. He avoids ibuprofen  due to previous advice and has not received injections for his back pain.  He smokes and has attempted to quit using patches and gum. He is open to exploring other cessation methods.  He is not currently taking Eliquis , which was prescribed post-hip surgery. He takes gabapentin  600 mg three times a day as needed for neuropathy and back pain. He is out of allergy medication.  He reports feeling tired at  times.  Labs    Medications: Outpatient Medications Prior to Visit  Medication Sig Note   celecoxib (CELEBREX) 200 MG capsule Take 200 mg by mouth.    tiZANidine (ZANAFLEX) 4 MG tablet Take 4 mg by mouth.    ACCU-CHEK GUIDE TEST test strip 1 EACH BY IN VITRO ROUTE DAILY BEFORE BREAKFAST    albuterol  (VENTOLIN  HFA) 108 (90 Base) MCG/ACT inhaler Inhale 2 puffs into the lungs every 6 (six) hours as needed for wheezing or shortness of breath.    Blood Glucose Monitoring Suppl DEVI 1 each by Does not apply route daily before breakfast. May substitute to any manufacturer covered by patient's insurance.    gabapentin  (NEURONTIN ) 600 MG tablet TAKE 1 TABLET BY MOUTH THREE TIMES A DAY    Lancet Device MISC 1 each by Does not apply route daily before breakfast. May substitute to any manufacturer covered by patient's insurance.    Lancets Misc. MISC 1 each by Does not apply route daily before breakfast. May substitute to any manufacturer covered by patient's insurance.    lisinopril  (ZESTRIL ) 20 MG tablet Take 20 mg by mouth daily.    metFORMIN  (GLUCOPHAGE ) 500 MG tablet Take 1 tablet (500 mg total) by mouth 2 (two) times daily with a meal.    pravastatin  (PRAVACHOL ) 40 MG tablet Take 1 tablet (40 mg total) by mouth daily.    [DISCONTINUED] acetaminophen  (TYLENOL ) 500 MG tablet Take 2 tablets (1,000 mg total) by mouth every 6 (six) hours.    [DISCONTINUED] amLODipine  (NORVASC ) 10 MG tablet Take 10 mg by mouth daily.    [DISCONTINUED] apixaban  (  ELIQUIS ) 2.5 MG TABS tablet Take 1 tablet (2.5 mg total) by mouth 2 (two) times daily. 11/22/2023: was ppx for bloot clots during hip operation   [DISCONTINUED] cetirizine  (ZYRTEC ) 10 MG tablet Take 1 tablet (10 mg total) by mouth daily.    [DISCONTINUED] STIOLTO RESPIMAT  2.5-2.5 MCG/ACT AERS Inhale 2 puffs into the lungs daily.    No facility-administered medications prior to visit.    Review of Systems  Respiratory: Negative.  Negative for cough, shortness  of breath and wheezing.   Cardiovascular:  Negative for chest pain, palpitations and leg swelling.  Neurological:  Negative for dizziness, weakness, light-headedness and headaches.        Objective    BP (!) 140/56 (BP Location: Right Arm, Patient Position: Sitting, Cuff Size: Normal)   Pulse 88   Ht 5' 9 (1.753 m)   Wt 221 lb (100.2 kg)   SpO2 99%   BMI 32.64 kg/m     Physical Exam Vitals reviewed.  Constitutional:      General: He is not in acute distress.    Appearance: Normal appearance. He is not diaphoretic.  HENT:     Head: Normocephalic and atraumatic.  Eyes:     General: No scleral icterus.    Conjunctiva/sclera: Conjunctivae normal.  Cardiovascular:     Rate and Rhythm: Normal rate and regular rhythm.     Pulses: Normal pulses.     Heart sounds: Normal heart sounds. No murmur heard. Pulmonary:     Effort: Pulmonary effort is normal. No respiratory distress.     Breath sounds: Normal breath sounds. No wheezing or rhonchi.  Musculoskeletal:     Cervical back: Neck supple.     Right lower leg: No edema.     Left lower leg: No edema.  Lymphadenopathy:     Cervical: No cervical adenopathy.  Skin:    General: Skin is warm and dry.     Findings: No rash.  Neurological:     Mental Status: He is alert and oriented to person, place, and time. Mental status is at baseline.  Psychiatric:        Mood and Affect: Mood normal.        Behavior: Behavior normal.      No results found for any visits on 11/22/23.  Assessment & Plan    Type 2 diabetes mellitus with other specified complication, without long-term current use of insulin  (HCC) -     Microalbumin / creatinine urine ratio -     Comprehensive metabolic panel with GFR -     Hemoglobin A1c -     Lipid panel  Centrilobular emphysema (HCC) Assessment & Plan: Follows with [facility]; will be scheduling an appointment soon. Defer to specialist management.   Chronic obstructive pulmonary disease,  unspecified COPD type (HCC) Assessment & Plan: Follows with [facility]; will be scheduling an appointment soon. Defer to specialist management.  Orders: -     Stiolto Respimat ; Inhale 2 puffs into the lungs daily.  Dispense: 36 g; Refill: 5  Vitamin B12 deficiency -     Vitamin B12  Seasonal allergies -     Cetirizine  HCl; Take 1 tablet (10 mg total) by mouth daily.  Dispense: 90 tablet; Refill: 3  Hypertension associated with diabetes (HCC) -     amLODIPine  Besylate; Take 1 tablet (10 mg total) by mouth daily.  Dispense: 90 tablet; Refill: 1  Nicotine  dependence with current use -     Varenicline  Tartrate (Starter); Take 1  each by mouth as directed.  Dispense: 1 each; Refill: 0  Encounter for smoking cessation counseling -     Varenicline  Tartrate (Starter); Take 1 each by mouth as directed.  Dispense: 1 each; Refill: 0      Type 2 diabetes mellitus Blood sugars well-controlled on metformin  500 mg, last A1c at 6.6%.  Chronic obstructive pulmonary disease (COPD) Breathing well-managed, no exacerbations. Stiolto tolerated, Incruse Ellipta  causes stomach upset. - Refills available for Stiolto; encouraged patient to pick it up from the pharmacy. - Instruct to use either Stiolto or Incruse Ellipta , not both. - Schedule follow-up with pulmonology.  Vitamin B12 deficiency Low B12 levels contributing to fatigue, no injections administered yet. - Schedule B12 injections.  Nicotine  dependence; smoking cessation counseling Continues smoking.has tried to quit using the patch and gum before; open to new cessation methods. - Prescribe Chantix  for smoking cessation.  Hypertension Chronic, stable.  Generally controlled at home, elevated today. Ran out of amlodipine . - Send prescription for amlodipine  10 mg.  Chronic back pain Managed with tizanidine and celecoxib, avoids ibuprofen , no desire for injections or surgery. - Follows with orthopedics; defer to specialist  management  Peripheral neuropathy Managed with gabapentin , not consistently taken, no significant issues.  Advised patient he may take the gabapentin  as needed.  Seasonal allergic rhinitis Out of allergy medication, refills should be available. - Send prescription for allergy medication.  General Health Maintenance Plans for flu, shingles, and pneumonia vaccines. Eye exam scheduled.    Return in about 3 months (around 02/21/2024) for CPE, Chronic f/u.      I discussed the assessment and treatment plan with the patient  The patient was provided an opportunity to ask questions and all were answered. The patient agreed with the plan and demonstrated an understanding of the instructions.   The patient was advised to call back or seek an in-person evaluation if the symptoms worsen or if the condition fails to improve as anticipated.    LAURAINE LOISE BUOY, DO  [facility] [phone] (phone) [phone] (fax)  [facility]

## 2023-11-22 NOTE — Telephone Encounter (Signed)
 Patient returned call, says he will cb to schedule B12 shots

## 2023-11-23 LAB — COMPREHENSIVE METABOLIC PANEL WITH GFR
ALT: 39 IU/L (ref 0–44)
AST: 39 IU/L (ref 0–40)
Albumin: 4 g/dL (ref 3.9–4.9)
Alkaline Phosphatase: 95 IU/L (ref 44–121)
BUN/Creatinine Ratio: 17 (ref 10–24)
BUN: 16 mg/dL (ref 8–27)
Bilirubin Total: 0.3 mg/dL (ref 0.0–1.2)
CO2: 25 mmol/L (ref 20–29)
Calcium: 9.5 mg/dL (ref 8.6–10.2)
Chloride: 97 mmol/L (ref 96–106)
Creatinine, Ser: 0.95 mg/dL (ref 0.76–1.27)
Globulin, Total: 2.6 g/dL (ref 1.5–4.5)
Glucose: 94 mg/dL (ref 70–99)
Potassium: 4.8 mmol/L (ref 3.5–5.2)
Sodium: 138 mmol/L (ref 134–144)
Total Protein: 6.6 g/dL (ref 6.0–8.5)
eGFR: 90 mL/min/1.73 (ref >59)

## 2023-11-23 LAB — MICROALBUMIN / CREATININE URINE RATIO
Creatinine, Urine: 96.7 mg/dL
Microalb/Creat Ratio: 35 mg/g{creat} — ABNORMAL HIGH (ref 0–29)
Microalbumin, Urine: 33.6 ug/mL

## 2023-11-23 LAB — LIPID PANEL
Chol/HDL Ratio: 3.3 ratio (ref 0.0–5.0)
Cholesterol, Total: 145 mg/dL (ref 100–199)
HDL: 44 mg/dL (ref >39)
LDL Chol Calc (NIH): 78 mg/dL (ref 0–99)
Triglycerides: 129 mg/dL (ref 0–149)
VLDL Cholesterol Cal: 23 mg/dL (ref 5–40)

## 2023-11-23 LAB — HEMOGLOBIN A1C
Est. average glucose Bld gHb Est-mCnc: 143 mg/dL
Hgb A1c MFr Bld: 6.6 % — ABNORMAL HIGH (ref 4.8–5.6)

## 2023-11-23 LAB — VITAMIN B12: Vitamin B-12: 184 pg/mL — ABNORMAL LOW (ref 232–1245)

## 2023-12-01 ENCOUNTER — Ambulatory Visit: Payer: Self-pay | Admitting: Family Medicine

## 2023-12-05 ENCOUNTER — Telehealth: Payer: Self-pay | Admitting: Acute Care

## 2023-12-05 DIAGNOSIS — F17210 Nicotine dependence, cigarettes, uncomplicated: Secondary | ICD-10-CM

## 2023-12-05 DIAGNOSIS — Z122 Encounter for screening for malignant neoplasm of respiratory organs: Secondary | ICD-10-CM

## 2023-12-05 DIAGNOSIS — Z87891 Personal history of nicotine dependence: Secondary | ICD-10-CM

## 2023-12-05 NOTE — Telephone Encounter (Signed)
 Lung Cancer Screening Narrative/Criteria Questionnaire (Cigarette Smokers Only- No Cigars/Pipes/vapes)   IZAIH KATAOKA   SDMV:12/13/23 at 10am/Natalie                                           1960-11-17               LDCT: 12/15/23 at 130p/[facility]    63 y.o.   Phone: [phone]  Lung Screening Narrative (confirm age 62-77 yrs Medicare / 50-80 yrs Private pay insurance)   Insurance information:Medicaid   Referring Provider:Pardue   This screening involves an initial phone call with a team member from our program. It is called a shared decision making visit. The initial meeting is required by insurance and Medicare to make sure you understand the program. This appointment takes about 15-20 minutes to complete. The CT scan will completed at a separate date/time. This scan takes about 5-10 minutes to complete and you may eat and drink before and after the scan.  Criteria questions for Lung Cancer Screening:   Are you a current or former smoker? Current Age began smoking: 13y   If you are a former smoker, what year did you quit smoking? NA   To calculate your smoking history, I need an accurate estimate of how many packs of cigarettes you smoked per day and for how many years. (Not just the number of PPD you are now smoking)   Years smoking 50 x Packs per day 1/2 to 2p = Pack years 85   (at least 20 pack yrs)   (Make sure they understand that we need to know how much they have smoked in the past, not just the number of PPD they are smoking now)  Do you have a personal history of cancer?  No    Do you have a family history of cancer? Yes  (cancer type and and relative) Father and sister /lung and pat gpa/prostate  Are you coughing up blood?  No  Have you had unexplained weight loss of 15 lbs or more in the last 6 months? No  It looks like you meet all criteria.     Additional information: N/A

## 2023-12-13 ENCOUNTER — Encounter

## 2023-12-14 ENCOUNTER — Telehealth: Payer: Self-pay

## 2023-12-14 NOTE — Telephone Encounter (Signed)
 Returned call. Confirmed CT appt for tomorrow. Advised the [facility] did not reach out, unsure who called patient.   Copied from CRM [id number]. Topic: General - Other >> Dec 13, 2023  5:12 PM Rilla B wrote: Reason for CRM: Patient returning a call to office. He believe it may be to reschedule upcoming CT appt??  I could not find any notes. Office closed.  Advised patient to look for a call from the office on tomorrow.

## 2023-12-15 ENCOUNTER — Ambulatory Visit: Admission: RE | Admit: 2023-12-15 | Source: Ambulatory Visit

## 2023-12-19 ENCOUNTER — Ambulatory Visit (INDEPENDENT_AMBULATORY_CARE_PROVIDER_SITE_OTHER): Admitting: Adult Health

## 2023-12-19 ENCOUNTER — Encounter: Payer: Self-pay | Admitting: Adult Health

## 2023-12-19 DIAGNOSIS — F17210 Nicotine dependence, cigarettes, uncomplicated: Secondary | ICD-10-CM

## 2023-12-19 NOTE — Progress Notes (Signed)
  Virtual Visit via Telephone Note  I connected with Jimmy Bishop , 12/19/23 11:35 AM by a telemedicine application and verified that I am speaking with the correct person using two identifiers.  Location: Patient: home Provider: home   I discussed the limitations of evaluation and management by telemedicine and the availability of in person appointments. The patient expressed understanding and agreed to proceed.   Shared Decision Making Visit Lung Cancer Screening Program [phone])   Eligibility: 63 y.o. Pack Years Smoking History Calculation = 63 pack years  (# packs/per year x # years smoked) Recent History of coughing up blood  no Unexplained weight loss? no ( >Than 15 pounds within the last 6 months ) Prior History Lung / other cancer no (Diagnosis within the last 5 years already requiring surveillance chest CT Scans). Smoking Status Current Smoker  Visit Components: Discussion included one or more decision making aids. YES Discussion included risk/benefits of screening. YES Discussion included potential follow up diagnostic testing for abnormal scans. YES Discussion included meaning and risk of over diagnosis. YES Discussion included meaning and risk of False Positives. YES Discussion included meaning of total radiation exposure. YES  Counseling Included: Importance of adherence to annual lung cancer LDCT screening. YES Impact of comorbidities on ability to participate in the program. YES Ability and willingness to under diagnostic treatment. YES  Smoking Cessation Counseling: Current Smokers:  Discussed importance of smoking cessation. yes Information about tobacco cessation classes and interventions provided to patient. yes Patient provided with ticket for LDCT Scan. yes Symptomatic Patient. NO Diagnosis Code: Tobacco Use Z72.0 Asymptomatic Patient yes  Counseling - 4 minutes of smoking cessation counseling (CT Chest Lung Cancer Screening Low Dose W/O CM)  [id number]  Z12.2-Screening of respiratory organs Z87.891-Personal history of nicotine  dependence   Lamarr Myers 12/19/23

## 2023-12-19 NOTE — Patient Instructions (Signed)

## 2023-12-20 ENCOUNTER — Ambulatory Visit
Admission: RE | Admit: 2023-12-20 | Discharge: 2023-12-20 | Disposition: A | Discharge status: Home / Self Care | Source: Ambulatory Visit | Attending: Acute Care | Admitting: Acute Care

## 2023-12-20 DIAGNOSIS — Z87891 Personal history of nicotine dependence: Secondary | ICD-10-CM | POA: Insufficient documentation

## 2023-12-20 DIAGNOSIS — F17210 Nicotine dependence, cigarettes, uncomplicated: Secondary | ICD-10-CM | POA: Insufficient documentation

## 2023-12-20 DIAGNOSIS — Z122 Encounter for screening for malignant neoplasm of respiratory organs: Secondary | ICD-10-CM | POA: Diagnosis present

## 2023-12-26 ENCOUNTER — Other Ambulatory Visit: Payer: Self-pay

## 2023-12-26 DIAGNOSIS — F17210 Nicotine dependence, cigarettes, uncomplicated: Secondary | ICD-10-CM

## 2023-12-26 DIAGNOSIS — Z87891 Personal history of nicotine dependence: Secondary | ICD-10-CM

## 2023-12-26 DIAGNOSIS — Z122 Encounter for screening for malignant neoplasm of respiratory organs: Secondary | ICD-10-CM

## 2024-01-02 NOTE — Telephone Encounter (Unsigned)
 Copied from CRM [id number]. Topic: General - Deceased Patient >> 01/24/2024  1:19 PM Delon DASEN wrote: Name of caller: Kate   Date of death: 01-20-2024   Name of funeral home: [employer] and [employer] number of funeral home: [phone]  Provider that needs to sign form: Dr Donzella  Timeline for signing: As soon as possible

## 2024-01-03 ENCOUNTER — Other Ambulatory Visit: Payer: Self-pay | Admitting: Student

## 2024-01-03 ENCOUNTER — Telehealth: Payer: Self-pay

## 2024-01-03 ENCOUNTER — Telehealth: Payer: Self-pay | Admitting: Family Medicine

## 2024-01-03 DIAGNOSIS — M5416 Radiculopathy, lumbar region: Secondary | ICD-10-CM

## 2024-01-03 DIAGNOSIS — G8929 Other chronic pain: Secondary | ICD-10-CM

## 2024-01-03 DIAGNOSIS — M47816 Spondylosis without myelopathy or radiculopathy, lumbar region: Secondary | ICD-10-CM

## 2024-01-03 NOTE — Progress Notes (Unsigned)
 Complex Care Management Note Care Guide Note  01/03/2024 Name: Jimmy Bishop MRN: [id number] DOB: 02-Aug-1960   Complex Care Management Outreach Attempts: An unsuccessful telephone outreach was attempted today to offer the patient information about available complex care management services.  Follow Up Plan:  Additional outreach attempts will be made to offer the patient complex care management information and services.   Encounter Outcome:  No Answer  Dreama [facility]  [facility], [facility] VBCI Assistant Direct Dial: [phone]  Fax: [phone]

## 2024-01-03 NOTE — Telephone Encounter (Signed)
 Copied from CRM [id number]. Topic: General - Other >> Jan 03, 2024 11:13 AM Wess RAMAN wrote: Reason for RMF:[employer]oo from [address] would like Farwell, Lauraine, DO to sign patient's death certificate in [facility].  Callback #: [phone]

## 2024-01-04 NOTE — Progress Notes (Signed)
 Complex Care Management Care Guide Note  01/04/2024 Name: Jimmy Bishop MRN: [id number] DOB: 11-28-1960  Jimmy Bishop is a 63 y.o. year old male who is a primary care patient of Pardue, Lauraine SAILOR, DO. I reached out to Jimmy Bishop by phone today to assist with scheduling  with the RN Case Manager.  Follow up plan: Per patients PCP, patient deceased.  Dreama [facility]  [facility], [facility] VBCI Assistant Direct Dial: [phone]  Fax: [phone]

## 2024-01-21 DEATH — deceased
